# Patient Record
Sex: Female | Born: 1970 | Race: White | Hispanic: No | Smoking: Former smoker
Health system: Southern US, Community
[De-identification: ages and names within clinical notes are randomized; demographics above are authoritative.]

## PROBLEM LIST (undated history)

## (undated) DIAGNOSIS — I509 Heart failure, unspecified: Secondary | ICD-10-CM

## (undated) DIAGNOSIS — I2510 Atherosclerotic heart disease of native coronary artery without angina pectoris: Secondary | ICD-10-CM

## (undated) DIAGNOSIS — I10 Essential (primary) hypertension: Secondary | ICD-10-CM

## (undated) DIAGNOSIS — Z8719 Personal history of other diseases of the digestive system: Secondary | ICD-10-CM

## (undated) HISTORY — PX: CHOLECYSTECTOMY: SHX55

## (undated) HISTORY — PX: CARDIAC CATHETERIZATION: SHX172

## (undated) HISTORY — PX: TUBAL LIGATION: SHX77

---

## 2014-12-08 ENCOUNTER — Encounter (HOSPITAL_COMMUNITY): Payer: Self-pay | Admitting: *Deleted

## 2014-12-08 ENCOUNTER — Emergency Department (HOSPITAL_COMMUNITY)
Admission: EM | Admit: 2014-12-08 | Discharge: 2014-12-08 | Disposition: A | Discharge status: Home / Self Care | Payer: Self-pay | Source: Home / Self Care | Attending: Family Medicine | Admitting: Family Medicine

## 2014-12-08 DIAGNOSIS — J069 Acute upper respiratory infection, unspecified: Secondary | ICD-10-CM

## 2014-12-08 DIAGNOSIS — H6500 Acute serous otitis media, unspecified ear: Secondary | ICD-10-CM

## 2014-12-08 LAB — POCT RAPID STREP A: Streptococcus, Group A Screen (Direct): NEGATIVE

## 2014-12-08 MED ORDER — FEXOFENADINE-PSEUDOEPHED ER 60-120 MG PO TB12: 1.0000 | ORAL_TABLET | Freq: Two times a day (BID) | ORAL | Status: DC

## 2014-12-08 MED ORDER — PREDNISONE 10 MG PO TABS: ORAL_TABLET | ORAL | Status: DC

## 2014-12-08 MED ORDER — FLUTICASONE PROPIONATE 50 MCG/ACT NA SUSP: 2.0000 | Freq: Two times a day (BID) | NASAL | Status: DC

## 2014-12-08 NOTE — ED Provider Notes (Signed)
CSN: [id number]     Arrival date & time 12/08/14  1408 History   First MD Initiated Contact with Patient 12/08/14 1447     Chief Complaint  Patient presents with  . Sore Throat   (Consider location/radiation/quality/duration/timing/severity/associated sxs/prior Treatment) HPI        43 year old female presents for evaluation of left-sided earache and sore throat, and lymphadenopathy. She has sore throat with swallowing as well. Her symptoms began 3 days ago and have been constant, neither to worsening nor improving. She has not taken any medications for treatment.    History reviewed. No pertinent past medical history. Past Surgical History  Procedure Laterality Date  . Cholecystectomy     No family history on file. History  Substance Use Topics  . Smoking status: Current Every Day Smoker  . Smokeless tobacco: Not on file  . Alcohol Use: Yes     Comment: occasional   OB History    No data available     Review of Systems  Constitutional: Negative for fever and chills.  HENT: Positive for congestion, ear pain and sore throat. Negative for rhinorrhea.   Respiratory: Negative for cough and shortness of breath.   Hematological: Positive for adenopathy.  All other systems reviewed and are negative.   Allergies  Review of patient's allergies indicates no known allergies.  Home Medications   Prior to Admission medications   Medication Sig Start Date End Date Taking? Authorizing Provider  BIOTIN PO Take by mouth.   Yes Historical Provider, MD  Multiple Vitamin (MULTIVITAMIN) capsule Take 1 capsule by mouth daily.   Yes Historical Provider, MD  fexofenadine-pseudoephedrine (ALLEGRA-D) 60-120 MG per tablet Take 1 tablet by mouth every 12 (twelve) hours. 12/08/14   Adrian BlackwaterZachary H Kamill Fulbright, PA-C  fluticasone (FLONASE) 50 MCG/ACT nasal spray Place 2 sprays into both nostrils 2 (two) times daily. Decrease to 2 sprays/nostril daily after 5 days 12/08/14   Graylon GoodZachary H Merridy Pascoe, PA-C  predniSONE  (DELTASONE) 10 MG tablet 4 tabs PO QD for 4 days; 3 tabs PO QD for 3 days; 2 tabs PO QD for 2 days; 1 tab PO QD for 1 day 12/08/14   Graylon GoodZachary H Lya Holben, PA-C   BP 124/78 mmHg  Pulse 84  Temp(Src) 98.3 F (36.8 C) (Oral)  Resp 18  SpO2 98%  LMP 11/14/2014 (Exact Date) Physical Exam  Constitutional: She is oriented to person, place, and time. Vital signs are normal. She appears well-developed and well-nourished. No distress.  HENT:  Head: Normocephalic and atraumatic.  Right Ear: Tympanic membrane, external ear and ear canal normal.  Left Ear: Ear canal normal. Left ear middle ear effusion: serous otitis media.  Neck: Normal range of motion. Neck supple.  Cardiovascular: Normal rate, regular rhythm and normal heart sounds.   Pulmonary/Chest: Effort normal and breath sounds normal. No respiratory distress.  Lymphadenopathy:       Head (right side): Tonsillar adenopathy present. No submandibular, no preauricular and no posterior auricular adenopathy present.       Head (left side): Tonsillar adenopathy present. No submandibular, no preauricular and no posterior auricular adenopathy present.    She has no cervical adenopathy.  Neurological: She is alert and oriented to person, place, and time. She has normal strength. Coordination normal.  Skin: Skin is warm and dry. No rash noted. She is not diaphoretic.  Psychiatric: She has a normal mood and affect. Judgment normal.  Nursing note and vitals reviewed.   ED Course  Procedures (including critical care time) Labs  Review Labs Reviewed  POCT RAPID STREP A ([facility] CARE ONLY)    Imaging Review No results found.   MDM   1. URI (upper respiratory infection)   2. Acute serous otitis media, recurrence not specified, unspecified laterality    Vitals are normal, she is afebrile and nontoxic, with mild symptoms. Physical exam shows serous otitis media. Treat symptomatically. Follow-up if no improvement in a week   Discharge Medication List  as of 12/08/2014  2:47 PM    START taking these medications   Details  fexofenadine-pseudoephedrine (ALLEGRA-D) 60-120 MG per tablet Take 1 tablet by mouth every 12 (twelve) hours., Starting 12/08/2014, Until Discontinued, Normal    fluticasone (FLONASE) 50 MCG/ACT nasal spray Place 2 sprays into both nostrils 2 (two) times daily. Decrease to 2 sprays/nostril daily after 5 days, Starting 12/08/2014, Until Discontinued, Normal    predniSONE (DELTASONE) 10 MG tablet 4 tabs PO QD for 4 days; 3 tabs PO QD for 3 days; 2 tabs PO QD for 2 days; 1 tab PO QD for 1 day, Normal           Graylon Good, PA-C 12/08/14 1513

## 2014-12-08 NOTE — ED Notes (Signed)
Started with left earache and sore throat 2 days ago.  Now also c/o lymphadenopathy.  Denies fevers.  Has not taken any measures to help alleviate sxs.

## 2014-12-08 NOTE — Discharge Instructions (Signed)
Serous Otitis Media Serous otitis media is fluid in the middle ear space. This space contains the bones for hearing and air. Air in the middle ear space helps to transmit sound.  The air gets there through the eustachian tube. This tube goes from the back of the nose (nasopharynx) to the middle ear space. It keeps the pressure in the middle ear the same as the outside world. It also helps to drain fluid from the middle ear space. CAUSES  Serous otitis media occurs when the eustachian tube gets blocked. Blockage can come from:  Ear infections.  Colds and other upper respiratory infections.  Allergies.  Irritants such as cigarette smoke.  Sudden changes in air pressure (such as descending in an airplane).  Enlarged adenoids.  A mass in the nasopharynx. During colds and upper respiratory infections, the middle ear space can become temporarily filled with fluid. This can happen after an ear infection also. Once the infection clears, the fluid will generally drain out of the ear through the eustachian tube. If it does not, then serous otitis media occurs. SIGNS AND SYMPTOMS   Hearing loss.  A feeling of fullness in the ear, without pain.  Young children may not show any symptoms but may show slight behavioral changes, such as agitation, ear pulling, or crying. DIAGNOSIS  Serous otitis media is diagnosed by an ear exam. Tests may be done to check on the movement of the eardrum. Hearing exams may also be done. TREATMENT  The fluid most often goes away without treatment. If allergy is the cause, allergy treatment may be helpful. Fluid that persists for several months may require minor surgery. A small tube is placed in the eardrum to:  Drain the fluid.  Restore the air in the middle ear space. In certain situations, antibiotic medicines are used to avoid surgery. Surgery may be done to remove enlarged adenoids (if this is the cause). HOME CARE INSTRUCTIONS   Keep children away from  tobacco smoke.  Keep all follow-up visits as directed by your health care provider. SEEK MEDICAL CARE IF:   Your hearing is not better in 3 months.  Your hearing is worse.  You have ear pain.  You have drainage from the ear.  You have dizziness.  You have serous otitis media only in one ear or have any bleeding from your nose (epistaxis).  You notice a lump on your neck. MAKE SURE YOU:  Understand these instructions.   Will watch your condition.   Will get help right away if you are not doing well or get worse.  Document Released: 03/04/2004 Document Revised: 04/29/2014 Document Reviewed: 07/10/2013 [facility] Patient Information 2015 [place], [place]. This information is not intended to replace advice given to you by your health care provider. Make sure you discuss any questions you have with your health care provider.  Upper Respiratory Infection, Adult An upper respiratory infection (URI) is also sometimes known as the common cold. The upper respiratory tract includes the nose, sinuses, throat, trachea, and bronchi. Bronchi are the airways leading to the lungs. Most people improve within 1 week, but symptoms can last up to 2 weeks. A residual cough may last even longer.  CAUSES Many different viruses can infect the tissues lining the upper respiratory tract. The tissues become irritated and inflamed and often become very moist. Mucus production is also common. A cold is contagious. You can easily spread the virus to others by oral contact. This includes kissing, sharing a glass, coughing, or sneezing.  Touching your mouth or nose and then touching a surface, which is then touched by another person, can also spread the virus. SYMPTOMS  Symptoms typically develop 1 to 3 days after you come in contact with a cold virus. Symptoms vary from person to person. They may include:  Runny nose.  Sneezing.  Nasal congestion.  Sinus irritation.  Sore throat.  Loss of voice  (laryngitis).  Cough.  Fatigue.  Muscle aches.  Loss of appetite.  Headache.  Low-grade fever. DIAGNOSIS  You might diagnose your own cold based on familiar symptoms, since most people get a cold 2 to 3 times a year. Your caregiver can confirm this based on your exam. Most importantly, your caregiver can check that your symptoms are not due to another disease such as strep throat, sinusitis, pneumonia, asthma, or epiglottitis. Blood tests, throat tests, and X-rays are not necessary to diagnose a common cold, but they may sometimes be helpful in excluding other more serious diseases. Your caregiver will decide if any further tests are required. RISKS AND COMPLICATIONS  You may be at risk for a more severe case of the common cold if you smoke cigarettes, have chronic heart disease (such as heart failure) or lung disease (such as asthma), or if you have a weakened immune system. The very young and very old are also at risk for more serious infections. Bacterial sinusitis, middle ear infections, and bacterial pneumonia can complicate the common cold. The common cold can worsen asthma and chronic obstructive pulmonary disease (COPD). Sometimes, these complications can require emergency medical care and may be life-threatening. PREVENTION  The best way to protect against getting a cold is to practice good hygiene. Avoid oral or hand contact with people with cold symptoms. Wash your hands often if contact occurs. There is no clear evidence that vitamin C, vitamin E, echinacea, or exercise reduces the chance of developing a cold. However, it is always recommended to get plenty of rest and practice good nutrition. TREATMENT  Treatment is directed at relieving symptoms. There is no cure. Antibiotics are not effective, because the infection is caused by a virus, not by bacteria. Treatment may include:  Increased fluid intake. Sports drinks offer valuable electrolytes, sugars, and fluids.  Breathing  heated mist or steam (vaporizer or shower).  Eating chicken soup or other clear broths, and maintaining good nutrition.  Getting plenty of rest.  Using gargles or lozenges for comfort.  Controlling fevers with ibuprofen or acetaminophen as directed by your caregiver.  Increasing usage of your inhaler if you have asthma. Zinc gel and zinc lozenges, taken in the first 24 hours of the common cold, can shorten the duration and lessen the severity of symptoms. Pain medicines may help with fever, muscle aches, and throat pain. A variety of non-prescription medicines are available to treat congestion and runny nose. Your caregiver can make recommendations and may suggest nasal or lung inhalers for other symptoms.  HOME CARE INSTRUCTIONS   Only take over-the-counter or prescription medicines for pain, discomfort, or fever as directed by your caregiver.  Use a warm mist humidifier or inhale steam from a shower to increase air moisture. This may keep secretions moist and make it easier to breathe.  Drink enough water and fluids to keep your urine clear or pale yellow.  Rest as needed.  Return to work when your temperature has returned to normal or as your caregiver advises. You may need to stay home longer to avoid infecting others. You can also use  a face mask and careful hand washing to prevent spread of the virus. SEEK MEDICAL CARE IF:   After the first few days, you feel you are getting worse rather than better.  You need your caregiver's advice about medicines to control symptoms.  You develop chills, worsening shortness of breath, or brown or red sputum. These may be signs of pneumonia.  You develop yellow or brown nasal discharge or pain in the face, especially when you bend forward. These may be signs of sinusitis.  You develop a fever, swollen neck glands, pain with swallowing, or white areas in the back of your throat. These may be signs of strep throat. SEEK IMMEDIATE MEDICAL CARE  IF:   You have a fever.  You develop severe or persistent headache, ear pain, sinus pain, or chest pain.  You develop wheezing, a prolonged cough, cough up blood, or have a change in your usual mucus (if you have chronic lung disease).  You develop sore muscles or a stiff neck. Document Released: 06/08/2001 Document Revised: 03/06/2012 Document Reviewed: 03/20/2014 [facility] Patient Information 2015 [place], [place]. This information is not intended to replace advice given to you by your health care provider. Make sure you discuss any questions you have with your health care provider.

## 2014-12-10 LAB — CULTURE, GROUP A STREP

## 2021-03-10 ENCOUNTER — Other Ambulatory Visit: Payer: Self-pay

## 2021-03-10 ENCOUNTER — Emergency Department (HOSPITAL_BASED_OUTPATIENT_CLINIC_OR_DEPARTMENT_OTHER)
Admission: EM | Admit: 2021-03-10 | Discharge: 2021-03-11 | Disposition: A | Discharge status: Home / Self Care | Payer: No Typology Code available for payment source | Source: Home / Self Care | Attending: Emergency Medicine | Admitting: Emergency Medicine

## 2021-03-10 DIAGNOSIS — I509 Heart failure, unspecified: Secondary | ICD-10-CM

## 2021-03-10 DIAGNOSIS — F17200 Nicotine dependence, unspecified, uncomplicated: Secondary | ICD-10-CM | POA: Insufficient documentation

## 2021-03-10 DIAGNOSIS — R609 Edema, unspecified: Secondary | ICD-10-CM | POA: Insufficient documentation

## 2021-03-10 DIAGNOSIS — K5900 Constipation, unspecified: Secondary | ICD-10-CM | POA: Insufficient documentation

## 2021-03-10 DIAGNOSIS — R000 Tachycardia, unspecified: Secondary | ICD-10-CM | POA: Insufficient documentation

## 2021-03-10 LAB — COMPREHENSIVE METABOLIC PANEL
ALT: 74 U/L — ABNORMAL HIGH (ref 0–44)
AST: 48 U/L — ABNORMAL HIGH (ref 15–41)
Albumin: 3.8 g/dL (ref 3.5–5.0)
Alkaline Phosphatase: 67 U/L (ref 38–126)
Anion gap: 11 (ref 5–15)
BUN: 14 mg/dL (ref 6–20)
CO2: 19 mmol/L — ABNORMAL LOW (ref 22–32)
Calcium: 8.7 mg/dL — ABNORMAL LOW (ref 8.9–10.3)
Chloride: 98 mmol/L (ref 98–111)
Creatinine, Ser: 0.83 mg/dL (ref 0.44–1.00)
GFR, Estimated: >60 mL/min (ref >60)
Glucose, Bld: 94 mg/dL (ref 70–99)
Potassium: 4.1 mmol/L (ref 3.5–5.1)
Sodium: 128 mmol/L — ABNORMAL LOW (ref 135–145)
Total Bilirubin: 0.5 mg/dL (ref 0.3–1.2)
Total Protein: 5.6 g/dL — ABNORMAL LOW (ref 6.5–8.1)

## 2021-03-10 LAB — CBC
HCT: 41.5 % (ref 36.0–46.0)
Hemoglobin: 14.5 g/dL (ref 12.0–15.0)
MCH: 34.5 pg — ABNORMAL HIGH (ref 26.0–34.0)
MCHC: 34.9 g/dL (ref 30.0–36.0)
MCV: 98.8 fL (ref 80.0–100.0)
Platelets: 274 10*3/uL (ref 150–400)
RBC: 4.2 MIL/uL (ref 3.87–5.11)
RDW: 13.2 % (ref 11.5–15.5)
WBC: 8.9 10*3/uL (ref 4.0–10.5)
nRBC: 0 % (ref 0.0–0.2)

## 2021-03-10 LAB — BRAIN NATRIURETIC PEPTIDE: B Natriuretic Peptide: 2348.9 pg/mL — ABNORMAL HIGH (ref 0.0–100.0)

## 2021-03-10 NOTE — ED Provider Notes (Signed)
[facility] EMERGENCY DEPT Provider Note   CSN: [id number] Arrival date & time: 03/10/21  2226     History Chief Complaint  Patient presents with  . Constipation  . Leg Swelling    Pamela Bryan is a 50 y.o. female.  Patient c/o bilateral leg swelling up to abdomen within the past two weeks. Symptoms acute onset, moderate, constant, persistent. Denies hx same. Denies hx liver or kidney disease. States was treated two weeks ago with antibiotic and prednisone for 'bronchitis' - cough improved and has been off antibiotic and prednisone but swelling persists. No fever/chills. ?10-15 lb wt increase. ?orthopnea. No pnd. No current or recent chest pain or discomfort. Occasional etoh use, no hx cirrhosis. Denies hx cad or chf. Urinating normal amount. Feels constipated at times, states is having bm q day, but smaller amount that normal. No nv.   The history is provided by a friend.  Constipation Associated symptoms: no abdominal pain, no back pain, no diarrhea, no dysuria, no fever and no vomiting        No past medical history on file.  There are no problems to display for this patient.   Past Surgical History:  Procedure Laterality Date  . CHOLECYSTECTOMY       OB History   No obstetric history on file.     No family history on file.  Social History   Tobacco Use  . Smoking status: Current Every Day Smoker  Substance Use Topics  . Alcohol use: Yes    Comment: occasional  . Drug use: No    Home Medications Prior to Admission medications   Medication Sig Start Date End Date Taking? Authorizing Provider  BIOTIN PO Take by mouth.    [provider]  fexofenadine-pseudoephedrine (ALLEGRA-D) 60-120 MG per tablet Take 1 tablet by mouth every 12 (twelve) hours. 12/08/14   Excell Seltzer, Adrian Blackwater, PA-C  fluticasone (FLONASE) 50 MCG/ACT nasal spray Place 2 sprays into both nostrils 2 (two) times daily. Decrease to 2 sprays/nostril daily after 5 days 12/08/14    Graylon Good, PA-C  Multiple Vitamin (MULTIVITAMIN) capsule Take 1 capsule by mouth daily.    [provider]  predniSONE (DELTASONE) 10 MG tablet 4 tabs PO QD for 4 days; 3 tabs PO QD for 3 days; 2 tabs PO QD for 2 days; 1 tab PO QD for 1 day 12/08/14   Graylon Good, PA-C    Allergies    Patient has no known allergies.  Review of Systems   Review of Systems  Constitutional: Negative for chills and fever.  HENT: Negative for sore throat.   Eyes: Negative for redness.  Respiratory:       Recent cough. ?mild recent orthopnea.  Cardiovascular: Positive for leg swelling. Negative for chest pain.  Gastrointestinal: Positive for constipation. Negative for abdominal pain, diarrhea and vomiting.  Genitourinary: Negative for decreased urine volume, dysuria and flank pain.  Musculoskeletal: Negative for back pain and neck pain.  Skin: Negative for rash.  Neurological: Negative for headaches.  Hematological: Does not bruise/bleed easily.  Psychiatric/Behavioral: Negative for confusion.    Physical Exam Updated Vital Signs BP 116/88 (BP Location: Right Arm)   Pulse (!) 110   Temp 97.8 F (36.6 C) (Oral)   Resp 20   Ht 1.702 m (5\' 7" )   Wt 74.8 kg   LMP  (LMP Unknown)   SpO2 98%   BMI 25.84 kg/m   Physical Exam Vitals and nursing note reviewed.  Constitutional:  Appearance: Normal appearance. She is well-developed.  HENT:     Head: Atraumatic.     Nose: Nose normal.     Mouth/Throat:     Mouth: Mucous membranes are moist.  Eyes:     General: No scleral icterus.    Conjunctiva/sclera: Conjunctivae normal.  Neck:     Trachea: No tracheal deviation.  Cardiovascular:     Rate and Rhythm: Normal rate and regular rhythm.     Pulses: Normal pulses.     Heart sounds: Normal heart sounds. No murmur heard. No friction rub. No gallop.   Pulmonary:     Effort: Pulmonary effort is normal. No respiratory distress.     Breath sounds: Normal breath sounds.   Abdominal:     General: Bowel sounds are normal. There is no distension.     Palpations: Abdomen is soft.     Tenderness: There is no abdominal tenderness. There is no guarding.  Genitourinary:    Comments: No cva tenderness.  Musculoskeletal:     Cervical back: Normal range of motion and neck supple. No rigidity. No muscular tenderness.     Right lower leg: Edema present.     Left lower leg: Edema present.     Comments: Symmetric bilateral leg edema, pitting, to mid abd.   Skin:    General: Skin is warm and dry.     Findings: No rash.  Neurological:     Mental Status: She is alert.     Comments: Alert, speech normal.   Psychiatric:        Mood and Affect: Mood normal.     ED Results / Procedures / Treatments   Labs (all labs ordered are listed, but only abnormal results are displayed) Labs Reviewed  CBC  COMPREHENSIVE METABOLIC PANEL  BRAIN NATRIURETIC PEPTIDE    EKG None  Radiology No results found.  Procedures Procedures   Medications Ordered in ED Medications - No data to display  ED Course  I have reviewed the triage vital signs and the nursing notes.  Pertinent labs & imaging results that were available during my care of the patient were reviewed by me and considered in my medical decision making (see chart for details).    MDM Rules/Calculators/A&P                          Labs sent.   Reviewed nursing notes and prior charts for additional history.   2300 labs pending, signed out to Dr Judd Lien to check labs, and disposition appropriately.    Final Clinical Impression(s) / ED Diagnoses Final diagnoses:  None    Rx / DC Orders ED Discharge Orders    None       Cathren Laine, MD 03/10/21 2300

## 2021-03-10 NOTE — ED Triage Notes (Addendum)
Pt via POV, patient reports approx 2 weeks ago was seen at urgent care for bronchitis and was given steroids for treatment. Pt reports after being on treatment has noted increase constipated and has noted swelling in bilateral legs. Pt ambulatory to room 11. NAD.  +ETOH.

## 2021-03-10 NOTE — ED Notes (Signed)
Called lab to add on troponin to blood work already in lab. Provider updated.

## 2021-03-11 ENCOUNTER — Other Ambulatory Visit: Payer: Self-pay

## 2021-03-11 ENCOUNTER — Encounter (HOSPITAL_COMMUNITY): Payer: Self-pay | Admitting: Emergency Medicine

## 2021-03-11 ENCOUNTER — Inpatient Hospital Stay (HOSPITAL_COMMUNITY)
Admission: EM | Admit: 2021-03-11 | Discharge: 2021-03-16 | DRG: 287 | Disposition: A | Discharge status: Home / Self Care | Payer: No Typology Code available for payment source | Attending: Internal Medicine | Admitting: Internal Medicine

## 2021-03-11 ENCOUNTER — Emergency Department (HOSPITAL_BASED_OUTPATIENT_CLINIC_OR_DEPARTMENT_OTHER): Payer: No Typology Code available for payment source

## 2021-03-11 ENCOUNTER — Emergency Department (HOSPITAL_COMMUNITY): Payer: No Typology Code available for payment source

## 2021-03-11 DIAGNOSIS — F17210 Nicotine dependence, cigarettes, uncomplicated: Secondary | ICD-10-CM | POA: Diagnosis present

## 2021-03-11 DIAGNOSIS — R609 Edema, unspecified: Secondary | ICD-10-CM

## 2021-03-11 DIAGNOSIS — I5082 Biventricular heart failure: Secondary | ICD-10-CM | POA: Diagnosis present

## 2021-03-11 DIAGNOSIS — I5021 Acute systolic (congestive) heart failure: Secondary | ICD-10-CM

## 2021-03-11 DIAGNOSIS — R7989 Other specified abnormal findings of blood chemistry: Secondary | ICD-10-CM | POA: Diagnosis present

## 2021-03-11 DIAGNOSIS — Z9049 Acquired absence of other specified parts of digestive tract: Secondary | ICD-10-CM

## 2021-03-11 DIAGNOSIS — Z8249 Family history of ischemic heart disease and other diseases of the circulatory system: Secondary | ICD-10-CM

## 2021-03-11 DIAGNOSIS — M7989 Other specified soft tissue disorders: Secondary | ICD-10-CM | POA: Diagnosis present

## 2021-03-11 DIAGNOSIS — J811 Chronic pulmonary edema: Secondary | ICD-10-CM | POA: Diagnosis present

## 2021-03-11 DIAGNOSIS — I5023 Acute on chronic systolic (congestive) heart failure: Principal | ICD-10-CM | POA: Diagnosis present

## 2021-03-11 DIAGNOSIS — F1010 Alcohol abuse, uncomplicated: Secondary | ICD-10-CM | POA: Diagnosis present

## 2021-03-11 DIAGNOSIS — Z79899 Other long term (current) drug therapy: Secondary | ICD-10-CM

## 2021-03-11 DIAGNOSIS — I509 Heart failure, unspecified: Secondary | ICD-10-CM

## 2021-03-11 DIAGNOSIS — R0683 Snoring: Secondary | ICD-10-CM | POA: Diagnosis present

## 2021-03-11 DIAGNOSIS — K7460 Unspecified cirrhosis of liver: Secondary | ICD-10-CM | POA: Diagnosis present

## 2021-03-11 DIAGNOSIS — J810 Acute pulmonary edema: Secondary | ICD-10-CM | POA: Diagnosis not present

## 2021-03-11 DIAGNOSIS — Z20822 Contact with and (suspected) exposure to covid-19: Secondary | ICD-10-CM | POA: Diagnosis present

## 2021-03-11 DIAGNOSIS — I426 Alcoholic cardiomyopathy: Secondary | ICD-10-CM | POA: Diagnosis present

## 2021-03-11 HISTORY — DX: Heart failure, unspecified: I50.9

## 2021-03-11 LAB — URINALYSIS, ROUTINE W REFLEX MICROSCOPIC
Bilirubin Urine: NEGATIVE
Glucose, UA: NEGATIVE mg/dL
Hgb urine dipstick: NEGATIVE
Ketones, ur: NEGATIVE mg/dL
Leukocytes,Ua: NEGATIVE
Nitrite: NEGATIVE
Protein, ur: NEGATIVE mg/dL
Specific Gravity, Urine: 1.004 — ABNORMAL LOW (ref 1.005–1.030)
pH: 6 (ref 5.0–8.0)

## 2021-03-11 LAB — CBC
HCT: 44.4 % (ref 36.0–46.0)
Hemoglobin: 15.4 g/dL — ABNORMAL HIGH (ref 12.0–15.0)
MCH: 34.7 pg — ABNORMAL HIGH (ref 26.0–34.0)
MCHC: 34.7 g/dL (ref 30.0–36.0)
MCV: 100 fL (ref 80.0–100.0)
Platelets: 308 10*3/uL (ref 150–400)
RBC: 4.44 MIL/uL (ref 3.87–5.11)
RDW: 13.2 % (ref 11.5–15.5)
WBC: 7.2 10*3/uL (ref 4.0–10.5)
nRBC: 0 % (ref 0.0–0.2)

## 2021-03-11 LAB — BASIC METABOLIC PANEL
Anion gap: 10 (ref 5–15)
BUN: 13 mg/dL (ref 6–20)
CO2: 25 mmol/L (ref 22–32)
Calcium: 8.8 mg/dL — ABNORMAL LOW (ref 8.9–10.3)
Chloride: 101 mmol/L (ref 98–111)
Creatinine, Ser: 0.88 mg/dL (ref 0.44–1.00)
GFR, Estimated: >60 mL/min (ref >60)
Glucose, Bld: 69 mg/dL — ABNORMAL LOW (ref 70–99)
Potassium: 4 mmol/L (ref 3.5–5.1)
Sodium: 136 mmol/L (ref 135–145)

## 2021-03-11 LAB — I-STAT BETA HCG BLOOD, ED (MC, WL, AP ONLY): I-stat hCG, quantitative: <5 m[IU]/mL (ref <5)

## 2021-03-11 LAB — TROPONIN I (HIGH SENSITIVITY): Troponin I (High Sensitivity): 98 ng/L — ABNORMAL HIGH (ref <18)

## 2021-03-11 MED ORDER — THIAMINE HCL 100 MG PO TABS
100.0000 mg | ORAL_TABLET | Freq: Every day | ORAL | Status: DC
  Administered 2021-03-11 – 2021-03-16 (×6): 100 mg via ORAL
  Filled 2021-03-11 (×6): qty 1

## 2021-03-11 MED ORDER — LORAZEPAM 2 MG/ML IJ SOLN: 0.0000 mg | Freq: Two times a day (BID) | INTRAMUSCULAR | Status: AC

## 2021-03-11 MED ORDER — ACETAMINOPHEN 325 MG PO TABS
650.0000 mg | ORAL_TABLET | Freq: Four times a day (QID) | ORAL | Status: DC | PRN
  Administered 2021-03-14: 650 mg via ORAL

## 2021-03-11 MED ORDER — THIAMINE HCL 100 MG/ML IJ SOLN: 100.0000 mg | Freq: Every day | INTRAMUSCULAR | Status: DC

## 2021-03-11 MED ORDER — LORAZEPAM 1 MG PO TABS
0.0000 mg | ORAL_TABLET | Freq: Two times a day (BID) | ORAL | Status: AC
  Administered 2021-03-15: 1 mg via ORAL
  Filled 2021-03-11: qty 1

## 2021-03-11 MED ORDER — LORAZEPAM 2 MG/ML IJ SOLN
1.0000 mg | Freq: Once | INTRAMUSCULAR | Status: AC
  Administered 2021-03-11: 1 mg via INTRAVENOUS
  Filled 2021-03-11: qty 1

## 2021-03-11 MED ORDER — POTASSIUM CHLORIDE 20 MEQ PO PACK
20.0000 meq | PACK | Freq: Every day | ORAL | Status: DC
  Administered 2021-03-12 – 2021-03-16 (×5): 20 meq via ORAL
  Filled 2021-03-11 (×5): qty 1

## 2021-03-11 MED ORDER — ACETAMINOPHEN 650 MG RE SUPP: 650.0000 mg | Freq: Four times a day (QID) | RECTAL | Status: DC | PRN

## 2021-03-11 MED ORDER — LORAZEPAM 1 MG PO TABS
0.0000 mg | ORAL_TABLET | Freq: Four times a day (QID) | ORAL | Status: AC
  Administered 2021-03-12 – 2021-03-13 (×5): 1 mg via ORAL
  Filled 2021-03-11 (×4): qty 1
  Filled 2021-03-11: qty 4

## 2021-03-11 MED ORDER — FUROSEMIDE 10 MG/ML IJ SOLN
40.0000 mg | Freq: Two times a day (BID) | INTRAMUSCULAR | Status: DC
  Administered 2021-03-12 – 2021-03-15 (×6): 40 mg via INTRAVENOUS
  Filled 2021-03-11 (×6): qty 4

## 2021-03-11 MED ORDER — ENOXAPARIN SODIUM 40 MG/0.4ML ~~LOC~~ SOLN
40.0000 mg | Freq: Every day | SUBCUTANEOUS | Status: DC
  Administered 2021-03-12 (×2): 40 mg via SUBCUTANEOUS
  Filled 2021-03-11 (×2): qty 0.4

## 2021-03-11 MED ORDER — NICOTINE 21 MG/24HR TD PT24
21.0000 mg | MEDICATED_PATCH | Freq: Every day | TRANSDERMAL | Status: DC
  Administered 2021-03-11 – 2021-03-16 (×6): 21 mg via TRANSDERMAL
  Filled 2021-03-11 (×6): qty 1

## 2021-03-11 MED ORDER — LORAZEPAM 2 MG/ML IJ SOLN: 0.0000 mg | Freq: Four times a day (QID) | INTRAMUSCULAR | Status: AC

## 2021-03-11 MED ORDER — FUROSEMIDE 40 MG PO TABS: 40.0000 mg | ORAL_TABLET | Freq: Every day | ORAL | 0 refills | Status: DC

## 2021-03-11 MED ORDER — FUROSEMIDE 40 MG PO TABS
40.0000 mg | ORAL_TABLET | Freq: Once | ORAL | Status: AC
  Administered 2021-03-11: 40 mg via ORAL
  Filled 2021-03-11: qty 1

## 2021-03-11 MED ORDER — FUROSEMIDE 10 MG/ML IJ SOLN
20.0000 mg | Freq: Once | INTRAMUSCULAR | Status: AC
  Administered 2021-03-11: 20 mg via INTRAVENOUS
  Filled 2021-03-11: qty 2

## 2021-03-11 NOTE — H&P (Signed)
History and Physical    Pamela Bryan LXB:[id number] DOB: Jan 29, 1971 DOA: 03/11/2021  Pamela Bryan  Patient coming from: Home  I have personally briefly reviewed patient's old medical records in [facility]  Chief Complaint: Shortness of breath, lower extremity swelling, weight gain  HPI: Pamela Bryan is a 50 y.o. female with medical history significant for alcohol and tobacco use who presents to the ED for evaluation of progressive shortness of breath, lower extremity swelling.  Patient initially presented to [facility] ED last night 03/10/2021 with report of bilateral leg swelling with 10-15 pound weight gain.  She was found to have elevated BNP 2348.9 and high-sensitivity troponin I 98.  Portable chest x-ray showed small bilateral pleural effusions.  The emergency physician discussed with on-call cardiologist who recommended admission for diuresis and further evaluation.  Patient however refused admission and left the ED this morning.  She was given a dose of oral Lasix 40 mg once before she left as well as a prescription for the same.  Patient returns to the ED with continued orthopnea, dyspnea on exertion, PND, lower extremity swelling.  She reports occasional lower sternal chest discomfort described as a heavy sensation.  She reports a dry cough which has been ongoing over the last 2 months.  She says she was recently diagnosed with bronchitis and treated with a course of prednisone.  She is not aware of any personal chronic medical conditions.  She is a current smoker of about 1 pack/day since age 44.  She reports 2-3 alcoholic drinks Bryan night.  She denies any illicit drug use.  She reports a history of valvular heart disease in her sister and her sister's son.  ED Course:  Initial vitals showed BP 99/75, pulse 125, RR 18, temp 97.6 F, SPO2 96% on room air.  Labs show WBC 7.2, hemoglobin 15.4, platelets 308,000, sodium 136, potassium 4.0, bicarb 25, BUN 13, creatinine  0.88, serum glucose 69, i-STAT beta-hCG <5.0.  SARS-CoV-2 PCR panel ordered and pending.  2 view chest x-ray shows developing peribronchial thickening suspicious for pulmonary edema, cardiomegaly and small bilateral pleural effusions which are unchanged.  Patient was given IV Lasix 20 mg once and placed on CIWA protocol with Ativan as needed.  She has been given 1 mg IV Ativan so far.  The hospitalist service was consulted to admit for further evaluation and management.  Review of Systems: All systems reviewed and are negative except as documented in history of present illness above.   Past Medical History:  Diagnosis Date  . CHF (congestive heart failure) (HCC)     Past Surgical History:  Procedure Laterality Date  . CHOLECYSTECTOMY      Social History:  reports that she has been smoking. She does not have any smokeless tobacco history on file. She reports current alcohol use. She reports that she does not use drugs.  No Known Allergies  Family History  Problem Relation Age of Onset  . Valvular heart disease Sister      Prior to Admission medications   Medication Sig Start Date End Date Taking? Authorizing Provider  furosemide (LASIX) 40 MG tablet Take 1 tablet (40 mg total) by mouth daily. 03/11/21  Yes Delo, Riley Lam, MD  levonorgestrel (LILETTA, 52 MG,) 19.5 MCG/DAY IUD IUD 1 Intra Uterine Device by Intrauterine route. 04/23/16  Yes [provider]  Multiple Vitamin (MULTIVITAMIN) capsule Take 1 capsule by mouth daily.   Yes [provider]  doxycycline (VIBRAMYCIN) 100 MG capsule Take  100 mg by mouth See admin instructions. Bid x 10 days Patient not taking: No sig reported 02/22/21   [provider]  predniSONE (DELTASONE) 20 MG tablet Take 40 mg by mouth See admin instructions. 40mg  daily x 5 days Patient not taking: No sig reported    [provider]    Physical Exam: Vitals:   03/11/21 2003 03/11/21 2300  BP: 99/75 108/80  Pulse:  (!) 125 (!) 108  Resp: 18 (!) 25  Temp: 97.6 F (36.4 C)   TempSrc: Oral   SpO2: 96% 96%   Constitutional: Resting in bed, NAD, calm, comfortable Eyes: PERRL, lids and conjunctivae normal ENMT: Mucous membranes are moist. Posterior pharynx clear of any exudate or lesions.Normal dentition.  Neck: normal, supple, no masses. Respiratory: Bibasilar inspiratory crackles. Normal respiratory effort. No accessory muscle use.  Cardiovascular: Regular rate and rhythm, no murmurs / rubs / gallops.  +1 bilateral lower extremity edema. 2+ pedal pulses. Abdomen: no tenderness, no masses palpated. No hepatosplenomegaly. Bowel sounds positive.  Musculoskeletal: no clubbing / cyanosis. No joint deformity upper and lower extremities. Good ROM, no contractures. Normal muscle tone.  Skin: no rashes, lesions, ulcers. No induration Neurologic: CN 2-12 grossly intact. Sensation intact. Strength 5/5 in all 4.  Psychiatric: Normal judgment and insight. Alert and oriented x 3. Normal mood.   Labs on Admission: I have personally reviewed following labs and imaging studies  CBC: Recent Labs  Lab 03/10/21 2253 03/11/21 2008  WBC 8.9 7.2  HGB 14.5 15.4*  HCT 41.5 44.4  MCV 98.8 100.0  PLT 274 308   Basic Metabolic Panel: Recent Labs  Lab 03/10/21 2253 03/11/21 2008  NA 128* 136  K 4.1 4.0  CL 98 101  CO2 19* 25  GLUCOSE 94 69*  BUN 14 13  CREATININE 0.83 0.88  CALCIUM 8.7* 8.8*   GFR: Estimated Creatinine Clearance: 81.7 mL/min (by C-G formula based on SCr of 0.88 mg/dL). Liver Function Tests: Recent Labs  Lab 03/10/21 2253  AST 48*  ALT 74*  ALKPHOS 67  BILITOT 0.5  PROT 5.6*  ALBUMIN 3.8   No results for input(s): LIPASE, AMYLASE in the last 168 hours. No results for input(s): AMMONIA in the last 168 hours. Coagulation Profile: No results for input(s): INR, PROTIME in the last 168 hours. Cardiac Enzymes: No results for input(s): CKTOTAL, CKMB, CKMBINDEX, TROPONINI in the last 168  hours. BNP (last 3 results) No results for input(s): PROBNP in the last 8760 hours. HbA1C: No results for input(s): HGBA1C in the last 72 hours. CBG: No results for input(s): GLUCAP in the last 168 hours. Lipid Profile: No results for input(s): CHOL, HDL, LDLCALC, TRIG, CHOLHDL, LDLDIRECT in the last 72 hours. Thyroid Function Tests: No results for input(s): TSH, T4TOTAL, FREET4, T3FREE, THYROIDAB in the last 72 hours. Anemia Panel: No results for input(s): VITAMINB12, FOLATE, FERRITIN, TIBC, IRON, RETICCTPCT in the last 72 hours. Urine analysis: No results found for: COLORURINE, APPEARANCEUR, LABSPEC, PHURINE, GLUCOSEU, HGBUR, BILIRUBINUR, KETONESUR, PROTEINUR, UROBILINOGEN, NITRITE, LEUKOCYTESUR  Radiological Exams on Admission: DG Chest 2 View  Result Date: 03/11/2021 CLINICAL DATA:  Shortness of breath for 2-3 days.  History of CHF. EXAM: CHEST - 2 VIEW COMPARISON:  Radiograph earlier today FINDINGS: Cardiomegaly. Small bilateral pleural effusions are similar. Ovoid density in the left mid lung likely represents fluid in the fissures on the lateral view. Developing peribronchial thickening. No pneumothorax. No acute osseous abnormalities are seen. IMPRESSION: 1. Developing peribronchial thickening, suspicious for pulmonary edema. 2.  Cardiomegaly and small bilateral pleural effusions, unchanged. Ovoid density in the left mid lung likely represents fluid in the fissure. Electronically Signed   By: Narda Rutherford M.D.   On: 03/11/2021 20:29   DG Chest Port 1 View  Result Date: 03/11/2021 CLINICAL DATA:  Shortness of breath with leg swelling EXAM: PORTABLE CHEST 1 VIEW COMPARISON:  None. FINDINGS: Small bilateral pleural effusions with basilar airspace disease. Mild cardiomegaly. Ovoid airspace disease in the left mid lung. No pneumothorax. IMPRESSION: Small bilateral pleural effusions with basilar airspace disease, atelectasis versus pneumonia. Mild cardiomegaly. Electronically Signed   By:  Jasmine Pang M.D.   On: 03/11/2021 00:17    EKG: Personally reviewed. Sinus tachycardia, rate 114, LAE, T wave inversion V6.  Not significantly changed when compared to prior from 03/10/2021.  Assessment/Plan Principal Problem:   Pulmonary edema   Pamela Bryan is a 50 y.o. female with medical history significant for alcohol and tobacco use who is admitted with suspected acute onset CHF with pulmonary edema.  Suspected acute onset CHF with pulmonary edema: Patient with progressive lower extremity edema, weight gain, orthopnea, DOE, BNP >2000, and CXR with cardiomegaly and pulmonary edema suggestive of acute CHF.  No previously known cardiac history.  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 -Continue diuresis with IV Lasix 40 mg twice daily -Obtain echocardiogram -Monitor strict I/O's and daily weights -Obtain D-dimer, if elevated would then obtain CTA chest PE study and/or lower extremity Dopplers to look for PE/DVT  Alcohol use: Continue CIWA protocol with as needed Ativan.  Tobacco use: Nicotine patch ordered.  DVT prophylaxis: Lovenox Code Status: Full code, confirmed with patient Family Communication: Discussed with patient, she has discussed with family Disposition Plan: From home and likely discharge to home pending adequate diuresis and further work-up Consults called: None Level of care: Telemetry Cardiac Admission status:  Status is: Observation  The patient remains OBS appropriate and will d/c before 2 midnights.  Dispo: The patient is from: Home              Anticipated d/c is to: Home              Patient currently is not medically stable to d/c.   Difficult to place patient No  Darreld Mclean MD [facility]  If 7PM-7AM, please contact night-coverage [email or web address]  03/11/2021, 11:27 PM

## 2021-03-11 NOTE — ED Provider Notes (Signed)
Needs  Emergency Department Provider Note   I have reviewed the triage vital signs and the nursing notes.   HISTORY  Chief Complaint Shortness of Breath   HPI Pamela Bryan is a 50 y.o. female presents to the emergency department with welling in the legs and dyspnea.  Patient developed symptoms in the past  2 weeks.  She has had abdominal distention and difficulty lying flat.  She has had approximately 15 pound weight gain suddenly along with significant swelling in the legs.  She drinks alcohol fairly heavily.  She does mention that symptoms began within a week of her COVID booster but otherwise no new medications or vaccinations.  She is not having active chest pain.  She is feeling some shortness of breath.  She was seen in the emergency department last night with elevated BNP refused admission at that time.  She filled her prescription for Lasix and took 2 doses but has not had significant improvement.  She does note some weight loss and diuresis since starting the p.o. medication.  She returns today with continued symptoms and ready for inpatient evaluation.   Past Medical History:  Diagnosis Date  . CHF (congestive heart failure) (HCC)     There are no problems to display for this patient.   Past Surgical History:  Procedure Laterality Date  . CHOLECYSTECTOMY      Allergies Patient has no known allergies.  No family history on file.  Social History Social History   Tobacco Use  . Smoking status: Current Every Day Smoker  Substance Use Topics  . Alcohol use: Yes    Comment: occasional  . Drug use: No    Review of Systems  Constitutional: No fever/chills Eyes: No visual changes. ENT: No sore throat. Cardiovascular: Denies chest pain. Positive LE edema.  Respiratory: Positive shortness of breath. Gastrointestinal: No abdominal pain.  No nausea, no vomiting.  No diarrhea.  No constipation. Genitourinary: Negative for dysuria. Musculoskeletal: Negative for back  pain. Skin: Negative for rash. Neurological: Negative for headaches, focal weakness or numbness.  10-point ROS otherwise negative.  ____________________________________________   PHYSICAL EXAM:  VITAL SIGNS: ED Triage Vitals  Enc Vitals Group     BP 03/11/21 2003 99/75     Pulse Rate 03/11/21 2003 (!) 125     Resp 03/11/21 2003 18     Temp 03/11/21 2003 97.6 F (36.4 C)     Temp Source 03/11/21 2003 Oral     SpO2 03/11/21 2003 96 %   Constitutional: Alert and oriented. Well appearing and in no acute distress. Eyes: Conjunctivae are normal.  Head: Atraumatic. Nose: No congestion/rhinnorhea. Mouth/Throat: Mucous membranes are moist.  Neck: No stridor.   Cardiovascular: Normal rate, regular rhythm. Good peripheral circulation. Grossly normal heart sounds.   Respiratory: Normal respiratory effort.  No retractions. Lungs CTAB. Gastrointestinal: Soft and nontender. No distention.  Musculoskeletal: No lower extremity tenderness with pitting edema. No gross deformities of extremities. Neurologic:  Normal speech and language. No gross focal neurologic deficits are appreciated.  Skin:  Skin is warm, dry and intact. No rash noted.   ____________________________________________   LABS (all labs ordered are listed, but only abnormal results are displayed)  Labs Reviewed  BASIC METABOLIC PANEL - Abnormal; Notable for the following components:      Result Value   Glucose, Bld 69 (*)    Calcium 8.8 (*)    All other components within normal limits  CBC - Abnormal; Notable for the following components:  Hemoglobin 15.4 (*)    MCH 34.7 (*)    All other components within normal limits  RESP PANEL BY RT-PCR (FLU A&B, COVID) ARPGX2  I-STAT BETA HCG BLOOD, ED ([facility], [facility], [facility] ONLY)   ____________________________________________  EKG   EKG Interpretation  Date/Time:  Wednesday March 11 2021 20:04:42 EDT Ventricular Rate:  118 PR Interval:  164 QRS Duration: 82 QT  Interval:  322 QTC Calculation: 451 R Axis:   64 Text Interpretation: Sinus tachycardia Biatrial enlargement Anteroseptal infarct , age undetermined Abnormal ECG Confirmed by Alona Bene [phone]) on 03/11/2021 9:16:11 PM       ____________________________________________  RADIOLOGY  DG Chest 2 View  Result Date: 03/11/2021 CLINICAL DATA:  Shortness of breath for 2-3 days.  History of CHF. EXAM: CHEST - 2 VIEW COMPARISON:  Radiograph earlier today FINDINGS: Cardiomegaly. Small bilateral pleural effusions are similar. Ovoid density in the left mid lung likely represents fluid in the fissures on the lateral view. Developing peribronchial thickening. No pneumothorax. No acute osseous abnormalities are seen. IMPRESSION: 1. Developing peribronchial thickening, suspicious for pulmonary edema. 2. Cardiomegaly and small bilateral pleural effusions, unchanged. Ovoid density in the left mid lung likely represents fluid in the fissure. Electronically Signed   By: Narda Rutherford M.D.   On: 03/11/2021 20:29   DG Chest Port 1 View  Result Date: 03/11/2021 CLINICAL DATA:  Shortness of breath with leg swelling EXAM: PORTABLE CHEST 1 VIEW COMPARISON:  None. FINDINGS: Small bilateral pleural effusions with basilar airspace disease. Mild cardiomegaly. Ovoid airspace disease in the left mid lung. No pneumothorax. IMPRESSION: Small bilateral pleural effusions with basilar airspace disease, atelectasis versus pneumonia. Mild cardiomegaly. Electronically Signed   By: Jasmine Pang M.D.   On: 03/11/2021 00:17    ____________________________________________   PROCEDURES  Procedure(s) performed:   Procedures  CRITICAL CARE Performed by: Maia Plan Total critical care time: 35 minutes Critical care time was exclusive of separately billable procedures and treating other patients. Critical care was necessary to treat or prevent imminent or life-threatening deterioration. Critical care was time spent  personally by me on the following activities: development of treatment plan with patient and/or surrogate as well as nursing, discussions with consultants, evaluation of patient's response to treatment, examination of patient, obtaining history from patient or surrogate, ordering and performing treatments and interventions, ordering and review of laboratory studies, ordering and review of radiographic studies, pulse oximetry and re-evaluation of patient's condition.  Alona Bene, MD Emergency Medicine  ____________________________________________   INITIAL IMPRESSION / ASSESSMENT AND PLAN / ED COURSE  Pertinent labs & imaging results that were available during my care of the patient were reviewed by me and considered in my medical decision making (see chart for details).   Patient presents to the emergency department for evaluation of shortness of breath, leg swelling, concern for new onset CHF.  She was seen yesterday with elevated BNP.  Chest x-ray from today shows cardiomegaly with bilateral pleural effusions and pulmonary edema.  Patient is not having pneumonia symptoms and so favor atelectasis at the basilar airspace.  Plan for IV Lasix here to continue diuresis and will admit for echo and further work-up per plan from yesterday.  Patient is a heavy alcohol drinker which may be the underlying etiology of her presumed CHF.  I have started her on CIWA protocol along with nicotine patch and Lasix.  Discussed patient's case with [facility] to request admission. Patient and family (if present) updated with plan. Care transferred to [facility] service.  I reviewed all nursing notes, vitals, pertinent old records, EKGs, labs, imaging (as available).  ____________________________________________  FINAL CLINICAL IMPRESSION(S) / ED DIAGNOSES  Final diagnoses:  Acute pulmonary edema (HCC)    MEDICATIONS GIVEN DURING THIS VISIT:  Medications  LORazepam (ATIVAN) injection 1 mg (has no administration in time  range)  nicotine (NICODERM CQ - dosed in mg/24 hours) patch 21 mg (has no administration in time range)  LORazepam (ATIVAN) injection 0-4 mg (has no administration in time range)    Or  LORazepam (ATIVAN) tablet 0-4 mg (has no administration in time range)  LORazepam (ATIVAN) injection 0-4 mg (has no administration in time range)    Or  LORazepam (ATIVAN) tablet 0-4 mg (has no administration in time range)  thiamine tablet 100 mg (has no administration in time range)    Or  thiamine (B-1) injection 100 mg (has no administration in time range)    Note:  This document was prepared using Dragon voice recognition software and may include unintentional dictation errors.  Alona Bene, MD, [facility] Emergency Medicine    Marcques Wrightsman, Arlyss Repress, MD 03/11/21 2237

## 2021-03-11 NOTE — ED Notes (Signed)
Patients Friend exited the Examination Room to advise [place] that the Patient does not wish to be admitted at this time and would like to be discharged.

## 2021-03-11 NOTE — ED Triage Notes (Signed)
Pt reports she was seen at [facility] last night and diagnosed with new onset CHF. C/o shortness of breath, BLE swelling and a 15lb weight gain in the last week.

## 2021-03-11 NOTE — Discharge Instructions (Addendum)
Begin taking Lasix 40 mg in the morning as prescribed today.  Follow-up with cardiology in the next 1 to 2 days.  The contact information for the [facility] cardiology clinic has been provided in this discharge summary for you to call and make these arrangements.  You were offered admission, however have declined and understand the risks of going home.  You are welcome to return to the emergency department at any point should you desire admission and care as recommended this evening.

## 2021-03-11 NOTE — ED Notes (Signed)
Pt educated on discharge paperwork. Pt reports understanding. Ambulatory with steady gait, NAD with friend.

## 2021-03-11 NOTE — ED Provider Notes (Addendum)
Care assumed from Dr. Cathlyn Parsons at shift change.  Patient presenting here with complaints of leg swelling, weight gain, and weakness worsening over the past 2 weeks.  Patient waiting for results of laboratory studies.  Laboratory studies have returned showing an unremarkable CBC and comprehensive metabolic panel.  Her BNP, however was significantly elevated at 2400.  Patient reevaluated and informed of the abnormal results.  I have also added on an EKG, troponin, and chest x-ray.  Patient has findings of LVH with repolarization abnormalities, mild cardiomegaly, and mildly elevated troponin of 98.  Patient appears to have new onset CHF.  She does admit to consuming alcohol regularly and according to her friend at bedside, drinks all day from morning to night.  She also reports that she had consumed alcohol in the car on the way here.  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 Okey Dupre.  Dr. Okey Dupre has suggested patient be admitted for diuresis and further evaluation.  Patient presented with this care plan, however is refusing to be admitted.  She tells me she has things to do at home and has to see her children before she can be admitted to the hospital.  Patient advised that her studies are abnormal and consistent with congestive heart failure and possibly a mild heart attack.  Patient understands the risks and benefits of admission versus discharge and is willing to accept them.  Patient appears to have decision-making capacity and will be discharged on her request.  She will be given Lasix prior to discharge.  I will also prescribe Lasix he can take at home.  A referral will be placed to cardiology for expedited follow-up.  Patient understands she can return at anytime for admission/completion of the care plan that was recommended to her.  ED ECG REPORT   Date: 03/11/2021  Rate: 107  Rhythm: sinus tachycardia  QRS Axis: left  Intervals:  normal  ST/T Wave abnormalities: nonspecific T wave changes, LVH with repolarization abnormality  Conduction Disutrbances:none  Narrative Interpretation:   Old EKG Reviewed: none available  I have personally reviewed the EKG tracing and agree with the computerized printout as noted.    Geoffery Lyons, MD 03/11/21 2820    Geoffery Lyons, MD 03/11/21 [phone]

## 2021-03-12 ENCOUNTER — Observation Stay (HOSPITAL_COMMUNITY): Payer: No Typology Code available for payment source

## 2021-03-12 DIAGNOSIS — J810 Acute pulmonary edema: Secondary | ICD-10-CM | POA: Diagnosis not present

## 2021-03-12 DIAGNOSIS — Z79899 Other long term (current) drug therapy: Secondary | ICD-10-CM | POA: Diagnosis not present

## 2021-03-12 DIAGNOSIS — F17200 Nicotine dependence, unspecified, uncomplicated: Secondary | ICD-10-CM | POA: Diagnosis not present

## 2021-03-12 DIAGNOSIS — I426 Alcoholic cardiomyopathy: Secondary | ICD-10-CM | POA: Diagnosis present

## 2021-03-12 DIAGNOSIS — M7989 Other specified soft tissue disorders: Secondary | ICD-10-CM | POA: Diagnosis present

## 2021-03-12 DIAGNOSIS — Z8249 Family history of ischemic heart disease and other diseases of the circulatory system: Secondary | ICD-10-CM | POA: Diagnosis not present

## 2021-03-12 DIAGNOSIS — I5021 Acute systolic (congestive) heart failure: Secondary | ICD-10-CM | POA: Diagnosis not present

## 2021-03-12 DIAGNOSIS — I5023 Acute on chronic systolic (congestive) heart failure: Secondary | ICD-10-CM | POA: Diagnosis present

## 2021-03-12 DIAGNOSIS — R0600 Dyspnea, unspecified: Secondary | ICD-10-CM

## 2021-03-12 DIAGNOSIS — R0602 Shortness of breath: Secondary | ICD-10-CM | POA: Diagnosis not present

## 2021-03-12 DIAGNOSIS — Z9049 Acquired absence of other specified parts of digestive tract: Secondary | ICD-10-CM | POA: Diagnosis not present

## 2021-03-12 DIAGNOSIS — I361 Nonrheumatic tricuspid (valve) insufficiency: Secondary | ICD-10-CM

## 2021-03-12 DIAGNOSIS — K7031 Alcoholic cirrhosis of liver with ascites: Secondary | ICD-10-CM | POA: Diagnosis not present

## 2021-03-12 DIAGNOSIS — I509 Heart failure, unspecified: Secondary | ICD-10-CM

## 2021-03-12 DIAGNOSIS — I351 Nonrheumatic aortic (valve) insufficiency: Secondary | ICD-10-CM

## 2021-03-12 DIAGNOSIS — R0683 Snoring: Secondary | ICD-10-CM | POA: Diagnosis present

## 2021-03-12 DIAGNOSIS — Z720 Tobacco use: Secondary | ICD-10-CM | POA: Diagnosis not present

## 2021-03-12 DIAGNOSIS — K7460 Unspecified cirrhosis of liver: Secondary | ICD-10-CM | POA: Diagnosis present

## 2021-03-12 DIAGNOSIS — Z20822 Contact with and (suspected) exposure to covid-19: Secondary | ICD-10-CM | POA: Diagnosis present

## 2021-03-12 DIAGNOSIS — I5082 Biventricular heart failure: Secondary | ICD-10-CM | POA: Diagnosis present

## 2021-03-12 DIAGNOSIS — F17210 Nicotine dependence, cigarettes, uncomplicated: Secondary | ICD-10-CM | POA: Diagnosis present

## 2021-03-12 DIAGNOSIS — R7989 Other specified abnormal findings of blood chemistry: Secondary | ICD-10-CM | POA: Diagnosis present

## 2021-03-12 DIAGNOSIS — F1010 Alcohol abuse, uncomplicated: Secondary | ICD-10-CM | POA: Diagnosis present

## 2021-03-12 DIAGNOSIS — I340 Nonrheumatic mitral (valve) insufficiency: Secondary | ICD-10-CM | POA: Diagnosis not present

## 2021-03-12 LAB — RAPID URINE DRUG SCREEN, HOSP PERFORMED
Amphetamines: NOT DETECTED
Barbiturates: NOT DETECTED
Benzodiazepines: NOT DETECTED
Cocaine: NOT DETECTED
Opiates: NOT DETECTED
Tetrahydrocannabinol: NOT DETECTED

## 2021-03-12 LAB — CBC
HCT: 42.8 % (ref 36.0–46.0)
Hemoglobin: 14.9 g/dL (ref 12.0–15.0)
MCH: 33.9 pg (ref 26.0–34.0)
MCHC: 34.8 g/dL (ref 30.0–36.0)
MCV: 97.5 fL (ref 80.0–100.0)
Platelets: 314 10*3/uL (ref 150–400)
RBC: 4.39 MIL/uL (ref 3.87–5.11)
RDW: 13.2 % (ref 11.5–15.5)
WBC: 6.5 10*3/uL (ref 4.0–10.5)
nRBC: 0 % (ref 0.0–0.2)

## 2021-03-12 LAB — BASIC METABOLIC PANEL
Anion gap: 11 (ref 5–15)
BUN: 14 mg/dL (ref 6–20)
CO2: 26 mmol/L (ref 22–32)
Calcium: 8.8 mg/dL — ABNORMAL LOW (ref 8.9–10.3)
Chloride: 99 mmol/L (ref 98–111)
Creatinine, Ser: 0.88 mg/dL (ref 0.44–1.00)
GFR, Estimated: >60 mL/min (ref >60)
Glucose, Bld: 111 mg/dL — ABNORMAL HIGH (ref 70–99)
Potassium: 3.9 mmol/L (ref 3.5–5.1)
Sodium: 136 mmol/L (ref 135–145)

## 2021-03-12 LAB — RESP PANEL BY RT-PCR (FLU A&B, COVID) ARPGX2
Influenza A by PCR: NEGATIVE
Influenza B by PCR: NEGATIVE
SARS Coronavirus 2 by RT PCR: NEGATIVE

## 2021-03-12 LAB — ECHOCARDIOGRAM COMPLETE
Calc EF: 19 %
P 1/2 time: 188 msec
S' Lateral: 6.4 cm
Single Plane A2C EF: 17.2 %
Single Plane A4C EF: 20.5 %

## 2021-03-12 LAB — TROPONIN I (HIGH SENSITIVITY)
Troponin I (High Sensitivity): 84 ng/L — ABNORMAL HIGH (ref <18)
Troponin I (High Sensitivity): 62 ng/L — ABNORMAL HIGH (ref <18)

## 2021-03-12 LAB — MAGNESIUM: Magnesium: 1.8 mg/dL (ref 1.7–2.4)

## 2021-03-12 LAB — TSH: TSH: 3.588 u[IU]/mL (ref 0.350–4.500)

## 2021-03-12 LAB — D-DIMER, QUANTITATIVE: D-Dimer, Quant: 1.74 ug/mL-FEU — ABNORMAL HIGH (ref 0.00–0.50)

## 2021-03-12 LAB — HIV ANTIBODY (ROUTINE TESTING W REFLEX): HIV Screen 4th Generation wRfx: NONREACTIVE

## 2021-03-12 LAB — ETHANOL: Alcohol, Ethyl (B): 122 mg/dL — ABNORMAL HIGH (ref <10)

## 2021-03-12 MED ORDER — SODIUM CHLORIDE 0.9 % IV SOLN
INTRAVENOUS | Status: DC
Start: 2021-03-13 — End: 2021-03-13

## 2021-03-12 MED ORDER — SODIUM CHLORIDE 0.9% FLUSH: 3.0000 mL | INTRAVENOUS | Status: DC | PRN

## 2021-03-12 MED ORDER — IOHEXOL 350 MG/ML SOLN
75.0000 mL | Freq: Once | INTRAVENOUS | Status: AC | PRN
  Administered 2021-03-12: 75 mL via INTRAVENOUS

## 2021-03-12 MED ORDER — SPIRONOLACTONE 12.5 MG HALF TABLET
12.5000 mg | ORAL_TABLET | Freq: Every day | ORAL | Status: DC
  Administered 2021-03-12 – 2021-03-16 (×5): 12.5 mg via ORAL
  Filled 2021-03-12 (×5): qty 1

## 2021-03-12 MED ORDER — ASPIRIN 81 MG PO CHEW
81.0000 mg | CHEWABLE_TABLET | ORAL | Status: AC
  Administered 2021-03-13: 81 mg via ORAL
  Filled 2021-03-12: qty 1

## 2021-03-12 MED ORDER — SODIUM CHLORIDE 0.9 % IV SOLN: 250.0000 mL | INTRAVENOUS | Status: DC | PRN

## 2021-03-12 MED ORDER — SODIUM CHLORIDE 0.9% FLUSH
3.0000 mL | Freq: Two times a day (BID) | INTRAVENOUS | Status: DC
  Administered 2021-03-12 – 2021-03-13 (×2): 3 mL via INTRAVENOUS

## 2021-03-12 MED ORDER — DIGOXIN 125 MCG PO TABS
0.1250 mg | ORAL_TABLET | Freq: Every day | ORAL | Status: DC
  Administered 2021-03-12 – 2021-03-16 (×5): 0.125 mg via ORAL
  Filled 2021-03-12 (×5): qty 1

## 2021-03-12 NOTE — Progress Notes (Signed)
PROGRESS NOTE  Pamela Bryan:[id number] DOB: 1971/06/10 DOA: 03/11/2021 PCP: Patient, No Pcp Per   LOS: 0 days   Brief narrative:  Pamela Bryan is a 50 y.o. female with medical history significant for alcohol and tobacco use who presented to the ED for progressive shortness of breath lower extremity swelling.  Her symptoms started since January. Patient had initially presented to [facility] ED  with report of bilateral leg swelling with 10-15 pound weight gain had refused at present..  Patient continues to smoke cigarettes and drinks wine at nighttime denies any withdrawal symptoms.  In the ED, on this admission, patient was noted to have a borderline low blood pressure with tachycardia but pulse ox was normal . 2 view chest x-ray shows developing peribronchial thickening suspicious for pulmonary edema, cardiomegaly and small bilateral pleural effusions which are unchanged. Patient was given IV Lasix 20 mg once and placed on CIWA protocol with Ativan as needed.    Assessment/Plan:  Principal Problem:   Pulmonary edema Alcohol use disorder  Acute onset CHF with pulmonary edema: Patient presented with progressive weight gain edema dyspnea on exertion orthopnea and typical features of congestive heart failure.  Possibility of alcohol induced cardiomyopathy.  Continue IV diuresis with Lasix twice a day.  Check 2D echocardiogram, strict intake and output charting.  Was done which showed no evidence of pulmonary embolism but pleural effusion.  Alcohol use disorder.: Continue CIWA protocol with as needed Ativan.  Denies withdrawal symptoms at home.  Tobacco use use disorder: Continue nicotine patch.  DVT prophylaxis: enoxaparin (LOVENOX) injection 40 mg Start: 03/11/21 2345   Code Status: Full code  Family Communication: None  Status is: Observation  Patient will need inpatient admission for IV diuresis, work-up of congestive heart failure, further diagnostic test, possible  cardiology evaluation.  Dispo: The patient is from: Home              Anticipated d/c is to: Home              Patient currently is not medically stable to d/c.   Difficult to place patient Yes  Consultants:  None  Procedures:  None  Anti-infectives:  . None  Anti-infectives (From admission, onward)   None      Subjective:  Today, patient was seen and examined at bedside.  Patient states that her breathing is slightly improved.  Orthopnea is still present but slightly better.  Has significant leg swelling.  Denies any tremors hallucinations dizziness lightheadedness.  Objective: Vitals:   03/12/21 0915 03/12/21 0925  BP:    Pulse: (!) 107 (!) 106  Resp: (!) 24 (!) 25  Temp:    SpO2: 96% 97%   No intake or output data in the 24 hours ending 03/12/21 1022 There were no vitals filed for this visit. There is no height or weight on file to calculate BMI.   Physical Exam: GENERAL: Patient is alert awake and oriented. Not in obvious distress.  On room air HENT: No scleral pallor or icterus. Pupils equally reactive to light. Oral mucosa is moist NECK: is supple, no gross swelling noted. CHEST:.  Diminished breath sounds bilaterally.  Coarse breath sounds noted. CVS: S1 and S2 heard, no murmur. Regular rate and rhythm.  ABDOMEN: Soft, non-tender, bowel sounds are present. EXTREMITIES: Bilateral lower extremity pitting edema noted. CNS: Cranial nerves are intact. No focal motor deficits. SKIN: warm and dry without rashes.  Data Review: I have personally reviewed the following laboratory data and studies,  CBC:  Recent Labs  Lab 03/10/21 2253 03/11/21 2008 03/12/21 0240  WBC 8.9 7.2 6.5  HGB 14.5 15.4* 14.9  HCT 41.5 44.4 42.8  MCV 98.8 100.0 97.5  PLT 274 308 314   Basic Metabolic Panel: Recent Labs  Lab 03/10/21 2253 03/11/21 2008 03/12/21 0240  NA 128* 136 136  K 4.1 4.0 3.9  CL 98 101 99  CO2 19* 25 26  GLUCOSE 94 69* 111*  BUN 14 13 14    CREATININE 0.83 0.88 0.88  CALCIUM 8.7* 8.8* 8.8*  MG  --   --  1.8   Liver Function Tests: Recent Labs  Lab 03/10/21 2253  AST 48*  ALT 74*  ALKPHOS 67  BILITOT 0.5  PROT 5.6*  ALBUMIN 3.8   No results for input(s): LIPASE, AMYLASE in the last 168 hours. No results for input(s): AMMONIA in the last 168 hours. Cardiac Enzymes: No results for input(s): CKTOTAL, CKMB, CKMBINDEX, TROPONINI in the last 168 hours. BNP (last 3 results) Recent Labs    03/10/21 2253  BNP 2,348.9*    ProBNP (last 3 results) No results for input(s): PROBNP in the last 8760 hours.  CBG: No results for input(s): GLUCAP in the last 168 hours. Recent Results (from the past 240 hour(s))  Resp Panel by RT-PCR (Flu A&B, Covid) Nasopharyngeal Swab     Status: None   Collection Time: 03/11/21 11:02 PM   Specimen: Nasopharyngeal Swab; Nasopharyngeal(NP) swabs in vial transport medium  Result Value Ref Range Status   SARS Coronavirus 2 by RT PCR NEGATIVE NEGATIVE Final    Comment: (NOTE) SARS-CoV-2 target nucleic acids are NOT DETECTED.  The SARS-CoV-2 RNA is generally detectable in upper respiratory specimens during the acute phase of infection. The lowest concentration of SARS-CoV-2 viral copies this assay can detect is 138 copies/mL. A negative result does not preclude SARS-Cov-2 infection and should not be used as the sole basis for treatment or other patient management decisions. A negative result may occur with  improper specimen collection/handling, submission of specimen other than nasopharyngeal swab, presence of viral mutation(s) within the areas targeted by this assay, and inadequate number of viral copies(<138 copies/mL). A negative result must be combined with clinical observations, patient history, and epidemiological information. The expected result is Negative.  Fact Sheet for Patients:  03/13/21  Fact Sheet for Healthcare Providers:   BloggerCourse.com  This test is no t yet approved or cleared by the [employer] FDA and  has been authorized for detection and/or diagnosis of SARS-CoV-2 by FDA under an Emergency Use Authorization (EUA). This EUA will remain  in effect (meaning this test can be used) for the duration of the COVID-19 declaration under Section 564(b)(1) of the Act, 21 U.S.C.section 360bbb-3(b)(1), unless the authorization is terminated  or revoked sooner.       Influenza A by PCR NEGATIVE NEGATIVE Final   Influenza B by PCR NEGATIVE NEGATIVE Final    Comment: (NOTE) The Xpert Xpress SARS-CoV-2/FLU/RSV plus assay is intended as an aid in the diagnosis of influenza from Nasopharyngeal swab specimens and should not be used as a sole basis for treatment. Nasal washings and aspirates are unacceptable for Xpert Xpress SARS-CoV-2/FLU/RSV testing.  Fact Sheet for Patients: [place]  Fact Sheet for Healthcare Providers: BloggerCourse.com  This test is not yet approved or cleared by the [employer] FDA and has been authorized for detection and/or diagnosis of SARS-CoV-2 by FDA under an Emergency Use Authorization (EUA). This EUA will remain in effect (meaning this test  can be used) for the duration of the COVID-19 declaration under Section 564(b)(1) of the Act, 21 U.S.C. section 360bbb-3(b)(1), unless the authorization is terminated or revoked.  Performed at [facility], [address], [place], [place] [address]      Studies: DG Chest 2 View  Result Date: 03/11/2021 CLINICAL DATA:  Shortness of breath for 2-3 days.  History of CHF. EXAM: CHEST - 2 VIEW COMPARISON:  Radiograph earlier today FINDINGS: Cardiomegaly. Small bilateral pleural effusions are similar. Ovoid density in the left mid lung likely represents fluid in the fissures on the lateral view. Developing peribronchial thickening. No pneumothorax.  No acute osseous abnormalities are seen. IMPRESSION: 1. Developing peribronchial thickening, suspicious for pulmonary edema. 2. Cardiomegaly and small bilateral pleural effusions, unchanged. Ovoid density in the left mid lung likely represents fluid in the fissure. Electronically Signed   By: Narda Rutherford M.D.   On: 03/11/2021 20:29   CT ANGIO CHEST PE W OR WO CONTRAST  Result Date: 03/12/2021 CLINICAL DATA:  Short of breath for 2 days, positive D dimer EXAM: CT ANGIOGRAPHY CHEST WITH CONTRAST TECHNIQUE: Multidetector CT imaging of the chest was performed using the standard protocol during bolus administration of intravenous contrast. Multiplanar CT image reconstructions and MIPs were obtained to evaluate the vascular anatomy. CONTRAST:  48mL OMNIPAQUE IOHEXOL 350 MG/ML SOLN COMPARISON:  03/11/2021 FINDINGS: Cardiovascular: This is a technically adequate evaluation of the pulmonary vasculature. No filling defects or pulmonary emboli. Heart is markedly enlarged, with severe left ventricular dilatation. No pericardial effusion. 4.1 cm ascending thoracic aortic aneurysm identified. No evidence of dissection. Mediastinum/Nodes: No pathologic adenopathy. Thyroid, trachea, and esophagus are unremarkable. Lungs/Pleura: There is a small right pleural effusion. There is dense right lower lobe atelectasis. Subsegmental atelectasis is seen within the lingula and left lower lobe. A small rounded area of ground-glass airspace disease is seen at the right apex, measuring up to 2.5 cm in diameter, favor focal inflammation or infection. No pneumothorax. Central airways are patent. Upper Abdomen: No acute abnormality. Musculoskeletal: No acute displaced fracture. Reconstructed images demonstrate no additional findings. Review of the MIP images confirms the above findings. IMPRESSION: 1. No evidence of pulmonary embolus. 2. Marked cardiomegaly, with pronounced left ventricular dilatation. 3. Small right pleural effusion. 4.  Rounded ground-glass consolidation within the right upper lobe, favor inflammation or infection. 5. 4.1 cm ascending thoracic aortic aneurysm. Recommend annual imaging followup by CTA or MRA. This recommendation follows 2010 ACCF/AHA/AATS/ACR/ASA/SCA/SCAI/SIR/STS/SVM Guidelines for the Diagnosis and Management of Patients with Thoracic Aortic Disease. Circulation. 2010; 121: E720-N470. Aortic aneurysm NOS (ICD10-I71.9) Electronically Signed   By: Sharlet Salina M.D.   On: 03/12/2021 02:52   DG Chest Port 1 View  Result Date: 03/11/2021 CLINICAL DATA:  Shortness of breath with leg swelling EXAM: PORTABLE CHEST 1 VIEW COMPARISON:  None. FINDINGS: Small bilateral pleural effusions with basilar airspace disease. Mild cardiomegaly. Ovoid airspace disease in the left mid lung. No pneumothorax. IMPRESSION: Small bilateral pleural effusions with basilar airspace disease, atelectasis versus pneumonia. Mild cardiomegaly. Electronically Signed   By: Jasmine Pang M.D.   On: 03/11/2021 00:17      Joycelyn Das, MD  [facility] 03/12/2021  If 7PM-7AM, please contact night-coverage

## 2021-03-12 NOTE — Progress Notes (Signed)
Patient brought to 4E from ED. Wall monitor telemetry applied, CCMD notified. CHG bath completed. VSS. Patient oriented to room and staff. Call bell in reach.  Kenard Gower, RN

## 2021-03-12 NOTE — Progress Notes (Signed)
  Echocardiogram 2D Echocardiogram has been performed.  Delcie Roch 03/12/2021, 11:14 AM

## 2021-03-12 NOTE — Progress Notes (Signed)
VASCULAR LAB    Bilateral lower extremity venous duplex has been performed.  See CV proc for preliminary results.   Nathan Stallworth, RVT 03/12/2021, 1:52 PM

## 2021-03-12 NOTE — ED Notes (Signed)
Patient transported to CT 

## 2021-03-12 NOTE — Consult Note (Addendum)
Advanced Heart Failure Team Consult Note   Primary Physician: none Primary Cardiologist:  none  Reason for Consultation: acute systolic CHF  HPI:    Pamela Bryan is seen today for evaluation of Acute systolic CHF at the request of Dr. Tyson Babinski, Rebekah [place].   50 yo female with PMH of alcohol use disorder, tobacco use disorder.    She presents with worsening bilateral leg edema, weight gain 10-15lb, cough since Januray.  Initially went to [facility] ED on 3/15 found to have BNP 2400, hs troponin 98, chest x-ray with pulm edema and bilateral effusions.  On call cardiologist recommended admission but patient was insistent on leaving after 40mg  oral lasix.  She was given a prescription for 40mg  daily.  She returned 3/16 with worsening symptoms and admitted to [place] for chf exacerbation.  Says her symptoms started in January first noticed orthopnea and PND.  Only developed lower extremity edema a few weeks ago.  She doesn't note anything out of the ordinary, had no viral symptoms and is vaccinated for covid.  Never had any chest pain episodes, but did note episodes of diaphoresis with exertion.  Sister has bicuspid valve but otherwise no cardiac family history.    Taking adderall that she gets from a friend, last dose was last week.  Denies any recent cocaine use but says she did use this in her twenties.  Drinking 3 glasses of wine or 3 mixed drinks daily by her estimate but more on the weekends.    She reports she has been urinating a lot since admission, with some improvement in breathing.    Review of Systems: [y] = yes, [ ]  = no   General: Weight gain [place] ]; Weight loss [ ] ; Anorexia [ ] ; Fatigue [ ] ; Fever [ ] ; Chills [ ] ; Weakness [ ]   Cardiac: Chest pain/pressure [ ] ; Resting SOB [place] ]; Exertional SOB [ y]; Orthopnea [ y]; Pedal Edema [place] ]; Palpitations [ ] ; Syncope [ ] ; Presyncope [ ] ; Paroxysmal nocturnal dyspnea[ ]   Pulmonary: Cough [place] ]; Wheezing[ ] ; Hemoptysis[ ] ; Sputum [ ] ;  Snoring [ ]   GI: Vomiting[ ] ; Dysphagia[ ] ; Melena[ ] ; Hematochezia [ ] ; Heartburn[ ] ; Abdominal pain [ ] ; Constipation [ ] ; Diarrhea [ ] ; BRBPR [ ]   GU: Hematuria[ ] ; Dysuria [ ] ; Nocturia[ ]   Vascular: Pain in legs with walking [ ] ; Pain in feet with lying flat [ ] ; Non-healing sores [ ] ; Stroke [ ] ; TIA [ ] ; Slurred speech [ ] ;  Neuro: Headaches[ ] ; Vertigo[ ] ; Seizures[ ] ; Paresthesias[ ] ;Blurred vision [ ] ; Diplopia [ ] ; Vision changes [ ]   Ortho/Skin: Arthritis [ ] ; Joint pain [ ] ; Muscle pain [ ] ; Joint swelling [ ] ; Back Pain [ ] ; Rash [ ]   Psych: Depression[y ]; Anxiety[ ]   Heme: Bleeding problems [ ] ; Clotting disorders [ ] ; Anemia [ ]   Endocrine: Diabetes [ ] ; Thyroid dysfunction[ ]   Home Medications Prior to Admission medications   Medication Sig Start Date End Date Taking? Authorizing Provider  furosemide (LASIX) 40 MG tablet Take 1 tablet (40 mg total) by mouth daily. 03/11/21  Yes Delo, Riley Lam, MD  levonorgestrel (LILETTA, 52 MG,) 19.5 MCG/DAY IUD IUD 1 Intra Uterine Device by Intrauterine route. 04/23/16  Yes [provider]  Multiple Vitamin (MULTIVITAMIN) capsule Take 1 capsule by mouth daily.   Yes [provider]  doxycycline (VIBRAMYCIN) 100 MG capsule Take 100 mg by mouth See admin instructions. Bid x 10 days Patient not taking:  No sig reported 02/22/21   [provider]  predniSONE (DELTASONE) 20 MG tablet Take 40 mg by mouth See admin instructions. 40mg  daily x 5 days Patient not taking: No sig reported    [provider]    Past Medical History: Past Medical History:  Diagnosis Date   CHF (congestive heart failure) (HCC)     Past Surgical History: Past Surgical History:  Procedure Laterality Date   CHOLECYSTECTOMY      Family History: Family History  Problem Relation Age of Onset   Valvular heart disease Sister     Social History: Social History   Socioeconomic History   Marital status: Divorced    Spouse  name: Not on file   Number of children: Not on file   Years of education: Not on file   Highest education level: Not on file  Occupational History   Not on file  Tobacco Use   Smoking status: Current Every Day Smoker   Smokeless tobacco: Not on file  Substance and Sexual Activity   Alcohol use: Yes    Comment: occasional   Drug use: No   Sexual activity: Not on file  Other Topics Concern   Not on file  Social History Narrative   Not on file   Social Determinants of Health   Financial Resource Strain: Not on file  Food Insecurity: Not on file  Transportation Needs: Not on file  Physical Activity: Not on file  Stress: Not on file  Social Connections: Not on file    Allergies:  No Known Allergies  Objective:    Vital Signs:   Temp:  [97.6 F (36.4 C)-98.2 F (36.8 C)] 98.1 F (36.7 C) (03/17 1315) Pulse Rate:  [39-125] 113 (03/17 1400) Resp:  [18-32] 24 (03/17 1400) BP: (91-126)/(64-86) 111/80 (03/17 1330) SpO2:  [92 %-100 %] 98 % (03/17 1400)    Weight change: There were no vitals filed for this visit.  Intake/Output:  No intake or output data in the 24 hours ending 03/12/21 1421    Physical Exam    General:  Older than stated age, mild dyspnea HEENT: normal Neck: supple. JVP 12 . Carotids 2+ bilat; no bruits. No lymphadenopathy or thyromegaly appreciated. Cor: PMI nondisplaced. Regular rate & rhythm. No rubs, gallops or murmurs. Lungs: bibasilar rales, decreased sounds in bases R>L Abdomen: soft, nontender, nondistended. No hepatosplenomegaly. No bruits or masses. Good bowel sounds. Extremities: warm,  1+ edema Neuro: alert & orientedx3, cranial nerves grossly intact. moves all 4 extremities w/o difficulty. Affect pleasant   Telemetry   Sinus tach 115  EKG    Sinus tach 107, LVH  Labs   Basic Metabolic Panel: Recent Labs  Lab 03/10/21 2253 03/11/21 2008 03/12/21 0240  NA 128* 136 136  K 4.1 4.0 3.9  CL 98 101 99  CO2 19* 25  26  GLUCOSE 94 69* 111*  BUN 14 13 14   CREATININE 0.83 0.88 0.88  CALCIUM 8.7* 8.8* 8.8*  MG  --   --  1.8    Liver Function Tests: Recent Labs  Lab 03/10/21 2253  AST 48*  ALT 74*  ALKPHOS 67  BILITOT 0.5  PROT 5.6*  ALBUMIN 3.8   No results for input(s): LIPASE, AMYLASE in the last 168 hours. No results for input(s): AMMONIA in the last 168 hours.  CBC: Recent Labs  Lab 03/10/21 2253 03/11/21 2008 03/12/21 0240  WBC 8.9 7.2 6.5  HGB 14.5 15.4* 14.9  HCT 41.5 44.4 42.8  MCV 98.8 100.0 97.5  PLT 274 308 314    Cardiac Enzymes: No results for input(s): CKTOTAL, CKMB, CKMBINDEX, TROPONINI in the last 168 hours.  BNP: BNP (last 3 results) Recent Labs    03/10/21 2253  BNP 2,348.9*    ProBNP (last 3 results) No results for input(s): PROBNP in the last 8760 hours.   CBG: No results for input(s): GLUCAP in the last 168 hours.  Coagulation Studies: No results for input(s): LABPROT, INR in the last 72 hours.   Imaging   DG Chest 2 View  Result Date: 03/11/2021 CLINICAL DATA:  Shortness of breath for 2-3 days.  History of CHF. EXAM: CHEST - 2 VIEW COMPARISON:  Radiograph earlier today FINDINGS: Cardiomegaly. Small bilateral pleural effusions are similar. Ovoid density in the left mid lung likely represents fluid in the fissures on the lateral view. Developing peribronchial thickening. No pneumothorax. No acute osseous abnormalities are seen. IMPRESSION: 1. Developing peribronchial thickening, suspicious for pulmonary edema. 2. Cardiomegaly and small bilateral pleural effusions, unchanged. Ovoid density in the left mid lung likely represents fluid in the fissure. Electronically Signed   By: Narda RutherfordMelanie  Sanford M.D.   On: 03/11/2021 20:29   CT ANGIO CHEST PE W OR WO CONTRAST  Result Date: 03/12/2021 CLINICAL DATA:  Short of breath for 2 days, positive D dimer EXAM: CT ANGIOGRAPHY CHEST WITH CONTRAST TECHNIQUE: Multidetector CT imaging of the chest was performed  using the standard protocol during bolus administration of intravenous contrast. Multiplanar CT image reconstructions and MIPs were obtained to evaluate the vascular anatomy. CONTRAST:  75mL OMNIPAQUE IOHEXOL 350 MG/ML SOLN COMPARISON:  03/11/2021 FINDINGS: Cardiovascular: This is a technically adequate evaluation of the pulmonary vasculature. No filling defects or pulmonary emboli. Heart is markedly enlarged, with severe left ventricular dilatation. No pericardial effusion. 4.1 cm ascending thoracic aortic aneurysm identified. No evidence of dissection. Mediastinum/Nodes: No pathologic adenopathy. Thyroid, trachea, and esophagus are unremarkable. Lungs/Pleura: There is a small right pleural effusion. There is dense right lower lobe atelectasis. Subsegmental atelectasis is seen within the lingula and left lower lobe. A small rounded area of ground-glass airspace disease is seen at the right apex, measuring up to 2.5 cm in diameter, favor focal inflammation or infection. No pneumothorax. Central airways are patent. Upper Abdomen: No acute abnormality. Musculoskeletal: No acute displaced fracture. Reconstructed images demonstrate no additional findings. Review of the MIP images confirms the above findings. IMPRESSION: 1. No evidence of pulmonary embolus. 2. Marked cardiomegaly, with pronounced left ventricular dilatation. 3. Small right pleural effusion. 4. Rounded ground-glass consolidation within the right upper lobe, favor inflammation or infection. 5. 4.1 cm ascending thoracic aortic aneurysm. Recommend annual imaging followup by CTA or MRA. This recommendation follows 2010 ACCF/AHA/AATS/ACR/ASA/SCA/SCAI/SIR/STS/SVM Guidelines for the Diagnosis and Management of Patients with Thoracic Aortic Disease. Circulation. 2010; 121: N629-B284: E266-e369. Aortic aneurysm NOS (ICD10-I71.9) Electronically Signed   By: Sharlet SalinaMichael  Brown M.D.   On: 03/12/2021 02:52   VAS US LOWER EXTREMITY VENOUS (DVT)  Result Date: 03/12/2021  Lower  Venous DVT Study Indications: Swelling, and SOB.  Comparison Study: No prior study on file Performing Technologist: Sherren Kernsandace Kanady RVS  Examination Guidelines: A complete evaluation includes B-mode imaging, spectral Doppler, color Doppler, and power Doppler as needed of all accessible portions of each vessel. Bilateral testing is considered an integral part of a complete examination. Limited examinations for reoccurring indications may be performed as noted. The reflux portion of the exam is performed with the patient in reverse Trendelenburg.  +---------+---------------+---------+-----------+----------+-------------------+  RIGHT  Compressibility Phasicity Spontaneity Properties Thrombus Aging       +---------+---------------+---------+-----------+----------+-------------------+  CFV       Full                                             pulsatile waveforms  +---------+---------------+---------+-----------+----------+-------------------+  SFJ       Full                                                                  +---------+---------------+---------+-----------+----------+-------------------+  FV Prox   Full                                                                  +---------+---------------+---------+-----------+----------+-------------------+  FV Mid    Full                                                                  +---------+---------------+---------+-----------+----------+-------------------+  FV Distal Full                                                                  +---------+---------------+---------+-----------+----------+-------------------+  PFV       Full                                                                  +---------+---------------+---------+-----------+----------+-------------------+  POP       Full                                             pulsatile waveforms  +---------+---------------+---------+-----------+----------+-------------------+  PTV       Full                                                                   +---------+---------------+---------+-----------+----------+-------------------+  PERO      Full                                                                  +---------+---------------+---------+-----------+----------+-------------------+   +---------+---------------+---------+-----------+----------+-------------------+  LEFT      Compressibility Phasicity Spontaneity Properties Thrombus Aging       +---------+---------------+---------+-----------+----------+-------------------+  CFV       Full                                             pulsatile waveforms  +---------+---------------+---------+-----------+----------+-------------------+  SFJ       Full                                                                  +---------+---------------+---------+-----------+----------+-------------------+  FV Prox   Full                                                                  +---------+---------------+---------+-----------+----------+-------------------+  FV Mid    Full                                                                  +---------+---------------+---------+-----------+----------+-------------------+  FV Distal Full                                                                  +---------+---------------+---------+-----------+----------+-------------------+  PFV       Full                                                                  +---------+---------------+---------+-----------+----------+-------------------+  POP       Full                                             pulsatile waveforms  +---------+---------------+---------+-----------+----------+-------------------+  PTV       Full                                                                  +---------+---------------+---------+-----------+----------+-------------------+  PERO      Full                                                                   +---------+---------------+---------+-----------+----------+-------------------+  Summary: RIGHT: - There is no evidence of deep vein thrombosis in the lower extremity.  LEFT: - There is no evidence of deep vein thrombosis in the lower extremity.  - A cystic structure is found in the popliteal fossa.  *See table(s) above for measurements and observations.    Preliminary       Medications:     Current Medications:  enoxaparin (LOVENOX) injection  40 mg Subcutaneous QHS   furosemide  40 mg Intravenous Q12H   LORazepam  0-4 mg Intravenous Q6H   Or   LORazepam  0-4 mg Oral Q6H   [START ON 03/14/2021] LORazepam  0-4 mg Intravenous Q12H   Or   [START ON 03/14/2021] LORazepam  0-4 mg Oral Q12H   nicotine  21 mg Transdermal Daily   potassium chloride  20 mEq Oral Daily   thiamine  100 mg Oral Daily   Or   thiamine  100 mg Intravenous Daily     Infusions:   Assessment/Plan   Acute systolic CHF, Biventricular CHF -ECHO 3/17 report pending my interpretation is severely reduced EF ~ 15%, global hypokinesis, mild-mod AI, mild-mod MR, mild TR, severely dilated LV, dilated IVC -Likely substance abuse related, alcohol vs recreational drugs but cannot rule out ischemic heart disease with smoking history other workup to follow initial results -R/LHC scheduled for tomorrow -diuresing on IV lasix, renal function stable, continue -add gdmt as bp allows, avoid any beta blockade for now  -add TSH, add UDS  Alcohol Use disorder: -on CIWA discussed cessation  Tobacco Use disorder: -on nicotine patch, discussed cessation  Length of Stay: 0  Angelita Ingles, MD  03/12/2021, 2:21 PM  Advanced Heart Failure Team Pager [phone] (M-F; 7a - 4p)  Please contact [facility] for night-coverage after hours (4p -7a ) and weekends on [email or web address]  Patient seen and examined with the above-signed Advanced Practice Provider and/or Housestaff. I personally reviewed laboratory data, imaging  studies and relevant notes. I independently examined the patient and formulated the important aspects of the plan. I have edited the note to reflect any of my changes or salient points. I have personally discussed the plan with the patient and/or family.  50 y/o smoker with ETOH abuse. No h/o heart disease. Presents with several week h/o increasing edema and SOB. Echo shows LV EF 15% RV moderately down. ECG with LVH with repol  General:  Sleeping hard to arouse HEENT: normal Neck: supple. JVP to ear  Carotids 2+ bilat; no bruits. No lymphadenopathy or thryomegaly appreciated. Cor: PMI nondisplaced.tachy regular  +s3 Lungs: clear Abdomen: soft, nontender, + distended. No hepatosplenomegaly. No bruits or masses. Good bowel sounds. Extremities: no cyanosis, clubbing, rash, 2+ edema Neuro: alert & orientedx3, cranial nerves grossly intact. moves all 4 extremities w/o difficulty. Affect pleasant  She has acute systolic HF with severe biventricular HF. Suspect NICM (ETOH?) but also at high risk for iCM. ECG shows LVH but denies h/o HTN.   Will need IV diuresis. Start GDMT. Plan R/L cath tomorrow.   Arvilla Meres, MD  4:03 PM

## 2021-03-12 NOTE — ED Notes (Signed)
Pt denied alcohol use when asked

## 2021-03-13 ENCOUNTER — Encounter (HOSPITAL_COMMUNITY)
Admission: EM | Disposition: A | Discharge status: Home / Self Care | Payer: Self-pay | Source: Home / Self Care | Attending: Internal Medicine

## 2021-03-13 DIAGNOSIS — I5021 Acute systolic (congestive) heart failure: Secondary | ICD-10-CM

## 2021-03-13 DIAGNOSIS — Z720 Tobacco use: Secondary | ICD-10-CM

## 2021-03-13 DIAGNOSIS — J810 Acute pulmonary edema: Secondary | ICD-10-CM | POA: Diagnosis not present

## 2021-03-13 DIAGNOSIS — F1010 Alcohol abuse, uncomplicated: Secondary | ICD-10-CM | POA: Diagnosis not present

## 2021-03-13 HISTORY — PX: RIGHT/LEFT HEART CATH AND CORONARY ANGIOGRAPHY: CATH118266

## 2021-03-13 LAB — POCT I-STAT EG7
Acid-Base Excess: 0 mmol/L (ref 0.0–2.0)
Acid-Base Excess: 3 mmol/L — ABNORMAL HIGH (ref 0.0–2.0)
Acid-Base Excess: 4 mmol/L — ABNORMAL HIGH (ref 0.0–2.0)
Bicarbonate: 24.6 mmol/L (ref 20.0–28.0)
Bicarbonate: 28.1 mmol/L — ABNORMAL HIGH (ref 20.0–28.0)
Bicarbonate: 28.7 mmol/L — ABNORMAL HIGH (ref 20.0–28.0)
Calcium, Ion: 0.86 mmol/L — CL (ref 1.15–1.40)
Calcium, Ion: 1.15 mmol/L (ref 1.15–1.40)
Calcium, Ion: 1.2 mmol/L (ref 1.15–1.40)
HCT: 41 % (ref 36.0–46.0)
HCT: 47 % — ABNORMAL HIGH (ref 36.0–46.0)
HCT: 48 % — ABNORMAL HIGH (ref 36.0–46.0)
Hemoglobin: 13.9 g/dL (ref 12.0–15.0)
Hemoglobin: 16 g/dL — ABNORMAL HIGH (ref 12.0–15.0)
Hemoglobin: 16.3 g/dL — ABNORMAL HIGH (ref 12.0–15.0)
O2 Saturation: 74 %
O2 Saturation: 78 %
O2 Saturation: 86 %
Potassium: 3.8 mmol/L (ref 3.5–5.1)
Potassium: 4.5 mmol/L (ref 3.5–5.1)
Potassium: 4.7 mmol/L (ref 3.5–5.1)
Sodium: 136 mmol/L (ref 135–145)
Sodium: 138 mmol/L (ref 135–145)
Sodium: 142 mmol/L (ref 135–145)
TCO2: 26 mmol/L (ref 22–32)
TCO2: 29 mmol/L (ref 22–32)
TCO2: 30 mmol/L (ref 22–32)
pCO2, Ven: 37.4 mmHg — ABNORMAL LOW (ref 44.0–60.0)
pCO2, Ven: 42.8 mmHg — ABNORMAL LOW (ref 44.0–60.0)
pCO2, Ven: 44 mmHg (ref 44.0–60.0)
pH, Ven: 7.413 (ref 7.250–7.430)
pH, Ven: 7.425 (ref 7.250–7.430)
pH, Ven: 7.434 — ABNORMAL HIGH (ref 7.250–7.430)
pO2, Ven: 39 mmHg (ref 32.0–45.0)
pO2, Ven: 42 mmHg (ref 32.0–45.0)
pO2, Ven: 50 mmHg — ABNORMAL HIGH (ref 32.0–45.0)

## 2021-03-13 LAB — POCT I-STAT 7, (LYTES, BLD GAS, ICA,H+H)
Acid-base deficit: 3 mmol/L — ABNORMAL HIGH (ref 0.0–2.0)
Bicarbonate: 20 mmol/L (ref 20.0–28.0)
Calcium, Ion: 0.66 mmol/L — CL (ref 1.15–1.40)
HCT: 35 % — ABNORMAL LOW (ref 36.0–46.0)
Hemoglobin: 11.9 g/dL — ABNORMAL LOW (ref 12.0–15.0)
O2 Saturation: 98 %
Potassium: 3 mmol/L — ABNORMAL LOW (ref 3.5–5.1)
Sodium: 149 mmol/L — ABNORMAL HIGH (ref 135–145)
TCO2: 21 mmol/L — ABNORMAL LOW (ref 22–32)
pCO2 arterial: 30.4 mmHg — ABNORMAL LOW (ref 32.0–48.0)
pH, Arterial: 7.426 (ref 7.350–7.450)
pO2, Arterial: 100 mmHg (ref 83.0–108.0)

## 2021-03-13 LAB — COMPREHENSIVE METABOLIC PANEL
ALT: 55 U/L — ABNORMAL HIGH (ref 0–44)
AST: 32 U/L (ref 15–41)
Albumin: 3.1 g/dL — ABNORMAL LOW (ref 3.5–5.0)
Alkaline Phosphatase: 71 U/L (ref 38–126)
Anion gap: 10 (ref 5–15)
BUN: 19 mg/dL (ref 6–20)
CO2: 25 mmol/L (ref 22–32)
Calcium: 8.9 mg/dL (ref 8.9–10.3)
Chloride: 99 mmol/L (ref 98–111)
Creatinine, Ser: 0.95 mg/dL (ref 0.44–1.00)
GFR, Estimated: >60 mL/min (ref >60)
Glucose, Bld: 121 mg/dL — ABNORMAL HIGH (ref 70–99)
Potassium: 3.7 mmol/L (ref 3.5–5.1)
Sodium: 134 mmol/L — ABNORMAL LOW (ref 135–145)
Total Bilirubin: 1.3 mg/dL — ABNORMAL HIGH (ref 0.3–1.2)
Total Protein: 5.4 g/dL — ABNORMAL LOW (ref 6.5–8.1)

## 2021-03-13 LAB — MAGNESIUM: Magnesium: 1.8 mg/dL (ref 1.7–2.4)

## 2021-03-13 LAB — CBC
HCT: 42.8 % (ref 36.0–46.0)
Hemoglobin: 14.9 g/dL (ref 12.0–15.0)
MCH: 33.9 pg (ref 26.0–34.0)
MCHC: 34.8 g/dL (ref 30.0–36.0)
MCV: 97.5 fL (ref 80.0–100.0)
Platelets: 300 10*3/uL (ref 150–400)
RBC: 4.39 MIL/uL (ref 3.87–5.11)
RDW: 13.2 % (ref 11.5–15.5)
WBC: 7.4 10*3/uL (ref 4.0–10.5)
nRBC: 0 % (ref 0.0–0.2)

## 2021-03-13 LAB — LIPID PANEL
Cholesterol: 157 mg/dL (ref 0–200)
HDL: 68 mg/dL (ref >40)
LDL Cholesterol: 80 mg/dL (ref 0–99)
Total CHOL/HDL Ratio: 2.3 RATIO
Triglycerides: 46 mg/dL (ref <150)
VLDL: 9 mg/dL (ref 0–40)

## 2021-03-13 LAB — PHOSPHORUS: Phosphorus: 5 mg/dL — ABNORMAL HIGH (ref 2.5–4.6)

## 2021-03-13 LAB — POCT ACTIVATED CLOTTING TIME: Activated Clotting Time: 142 seconds

## 2021-03-13 LAB — BRAIN NATRIURETIC PEPTIDE: B Natriuretic Peptide: 2254.2 pg/mL — ABNORMAL HIGH (ref 0.0–100.0)

## 2021-03-13 SURGERY — RIGHT/LEFT HEART CATH AND CORONARY ANGIOGRAPHY
Anesthesia: LOCAL

## 2021-03-13 MED ORDER — MAGNESIUM SULFATE 2 GM/50ML IV SOLN
2.0000 g | Freq: Once | INTRAVENOUS | Status: AC
  Administered 2021-03-13: 2 g via INTRAVENOUS
  Filled 2021-03-13: qty 50

## 2021-03-13 MED ORDER — LIDOCAINE HCL (PF) 1 % IJ SOLN
INTRAMUSCULAR | Status: AC
  Filled 2021-03-13: qty 30

## 2021-03-13 MED ORDER — SODIUM CHLORIDE 0.9% FLUSH: 3.0000 mL | INTRAVENOUS | Status: DC | PRN

## 2021-03-13 MED ORDER — VERAPAMIL HCL 2.5 MG/ML IV SOLN
INTRAVENOUS | Status: AC
  Filled 2021-03-13: qty 2

## 2021-03-13 MED ORDER — ENOXAPARIN SODIUM 40 MG/0.4ML ~~LOC~~ SOLN
40.0000 mg | SUBCUTANEOUS | Status: DC
  Administered 2021-03-14 – 2021-03-16 (×3): 40 mg via SUBCUTANEOUS
  Filled 2021-03-13 (×3): qty 0.4

## 2021-03-13 MED ORDER — HEPARIN SODIUM (PORCINE) 1000 UNIT/ML IJ SOLN
INTRAMUSCULAR | Status: AC
  Filled 2021-03-13: qty 1

## 2021-03-13 MED ORDER — LIDOCAINE HCL (PF) 1 % IJ SOLN
INTRAMUSCULAR | Status: DC | PRN
  Administered 2021-03-13: 5 mL

## 2021-03-13 MED ORDER — VERAPAMIL HCL 2.5 MG/ML IV SOLN
INTRAVENOUS | Status: DC | PRN
  Administered 2021-03-13: 10 mL via INTRA_ARTERIAL

## 2021-03-13 MED ORDER — POTASSIUM CHLORIDE CRYS ER 20 MEQ PO TBCR
40.0000 meq | EXTENDED_RELEASE_TABLET | Freq: Once | ORAL | Status: AC
  Administered 2021-03-13: 40 meq via ORAL
  Filled 2021-03-13: qty 2

## 2021-03-13 MED ORDER — LABETALOL HCL 5 MG/ML IV SOLN: 10.0000 mg | INTRAVENOUS | Status: AC | PRN

## 2021-03-13 MED ORDER — SODIUM CHLORIDE 0.9 % IV SOLN
250.0000 mL | INTRAVENOUS | Status: DC | PRN
Start: 2021-03-13 — End: 2021-03-16

## 2021-03-13 MED ORDER — MIDAZOLAM HCL 2 MG/2ML IJ SOLN
INTRAMUSCULAR | Status: AC
  Filled 2021-03-13: qty 2

## 2021-03-13 MED ORDER — IOHEXOL 350 MG/ML SOLN
INTRAVENOUS | Status: DC | PRN
  Administered 2021-03-13: 20 mL

## 2021-03-13 MED ORDER — FENTANYL CITRATE (PF) 100 MCG/2ML IJ SOLN
INTRAMUSCULAR | Status: AC
  Filled 2021-03-13: qty 2

## 2021-03-13 MED ORDER — ACETAMINOPHEN 325 MG PO TABS
650.0000 mg | ORAL_TABLET | ORAL | Status: DC | PRN
  Filled 2021-03-13: qty 2

## 2021-03-13 MED ORDER — HEPARIN SODIUM (PORCINE) 1000 UNIT/ML IJ SOLN
INTRAMUSCULAR | Status: DC | PRN
  Administered 2021-03-13: 3500 [IU] via INTRAVENOUS

## 2021-03-13 MED ORDER — SODIUM CHLORIDE 0.9% FLUSH
3.0000 mL | Freq: Two times a day (BID) | INTRAVENOUS | Status: DC
  Administered 2021-03-13 – 2021-03-16 (×5): 3 mL via INTRAVENOUS

## 2021-03-13 MED ORDER — HEPARIN (PORCINE) IN NACL 1000-0.9 UT/500ML-% IV SOLN
INTRAVENOUS | Status: AC
  Filled 2021-03-13: qty 1000

## 2021-03-13 MED ORDER — HEPARIN (PORCINE) IN NACL 1000-0.9 UT/500ML-% IV SOLN
INTRAVENOUS | Status: DC | PRN
  Administered 2021-03-13 (×2): 500 mL

## 2021-03-13 MED ORDER — ONDANSETRON HCL 4 MG/2ML IJ SOLN
4.0000 mg | Freq: Four times a day (QID) | INTRAMUSCULAR | Status: DC | PRN
  Filled 2021-03-13: qty 2

## 2021-03-13 MED ORDER — HYDRALAZINE HCL 20 MG/ML IJ SOLN
10.0000 mg | INTRAMUSCULAR | Status: AC | PRN
Start: 2021-03-13 — End: 2021-03-14

## 2021-03-13 MED ORDER — FENTANYL CITRATE (PF) 100 MCG/2ML IJ SOLN
INTRAMUSCULAR | Status: DC | PRN
  Administered 2021-03-13: 25 ug via INTRAVENOUS

## 2021-03-13 MED ORDER — MIDAZOLAM HCL 2 MG/2ML IJ SOLN
INTRAMUSCULAR | Status: DC | PRN
  Administered 2021-03-13: 1 mg via INTRAVENOUS

## 2021-03-13 SURGICAL SUPPLY — 10 items
CATH 5FR JL3.5 JR4 ANG PIG MP (CATHETERS) ×2 IMPLANT
CATH BALLN WEDGE 5F 110CM (CATHETERS) ×2 IMPLANT
DEVICE RAD COMP TR BAND LRG (VASCULAR PRODUCTS) ×2 IMPLANT
GLIDESHEATH SLEND SS 6F .021 (SHEATH) ×2 IMPLANT
GUIDEWIRE INQWIRE 1.5J.035X260 (WIRE) ×1 IMPLANT
INQWIRE 1.5J .035X260CM (WIRE) ×2
KIT HEART LEFT (KITS) ×2 IMPLANT
PACK CARDIAC CATHETERIZATION (CUSTOM PROCEDURE TRAY) ×2 IMPLANT
SHEATH GLIDE SLENDER 4/5FR (SHEATH) ×2 IMPLANT
TRANSDUCER W/STOPCOCK (MISCELLANEOUS) ×2 IMPLANT

## 2021-03-13 NOTE — Interval H&P Note (Signed)
History and Physical Interval Note:  03/13/2021 5:29 PM  Pamela Bryan  has presented today for surgery, with the diagnosis of chest pain.  The various methods of treatment have been discussed with the patient and family. After consideration of risks, benefits and other options for treatment, the patient has consented to  Procedure(s): RIGHT/LEFT HEART CATH AND CORONARY ANGIOGRAPHY (N/A) and possible coronary angioplasty as a surgical intervention.  The patient's history has been reviewed, patient examined, no change in status, stable for surgery.  I have reviewed the patient's chart and labs.  Questions were answered to the patient's satisfaction.     Jaicion Laurie

## 2021-03-13 NOTE — Progress Notes (Signed)
Advanced Heart Failure Team Rounding Note   Primary Physician: none Primary Cardiologist:  none  Reason for Consultation: acute systolic biventricular CHF  HPI:    She reports breathing more comfortably today, good urine output with IV lasix.  She denies any chest pain.  She was able to sleep last night.     Objective:    Vital Signs:   Temp:  [98 F (36.7 C)-98.7 F (37.1 C)] 98 F (36.7 C) (03/18 0853) Pulse Rate:  [99-119] 106 (03/18 0853) Resp:  [18-30] 20 (03/18 0853) BP: (99-111)/(68-85) 101/78 (03/18 0853) SpO2:  [92 %-100 %] 98 % (03/18 0853) Weight:  [68.9 kg-79.3 kg] 68.9 kg (03/18 0550) Last BM Date: 03/10/21  Weight change: Filed Weights   03/13/21 0318 03/13/21 0550  Weight: 79.3 kg 68.9 kg    Intake/Output:   Intake/Output Summary (Last 24 hours) at 03/13/2021 1204 Last data filed at 03/13/2021 0430 Gross per 24 hour  Intake --  Output 400 ml  Net -400 ml      Physical Exam    Cardiac: JVD 11, mildly tachycardic with normal rhythm, clear s1 and s2, 2/6 diastolic murmur, no LE edema Pulmonary: decreased breath sounds RLL, faint rales LLL, not in distress Abdominal: non distended abdomen, soft and nontender Psych: Alert, conversant, in good spirits    Telemetry   Sinus tach  ~100  EKG    No new ecg  Labs   Basic Metabolic Panel: Recent Labs  Lab 03/10/21 2253 03/11/21 2008 03/12/21 0240 03/13/21 0144  NA 128* 136 136 134*  K 4.1 4.0 3.9 3.7  CL 98 101 99 99  CO2 19* 25 26 25   GLUCOSE 94 69* 111* 121*  BUN 14 13 14 19   CREATININE 0.83 0.88 0.88 0.95  CALCIUM 8.7* 8.8* 8.8* 8.9  MG  --   --  1.8 1.8  PHOS  --   --   --  5.0*    Liver Function Tests: Recent Labs  Lab 03/10/21 2253 03/13/21 0144  AST 48* 32  ALT 74* 55*  ALKPHOS 67 71  BILITOT 0.5 1.3*  PROT 5.6* 5.4*  ALBUMIN 3.8 3.1*   No results for input(s): LIPASE, AMYLASE in the last 168 hours. No results for input(s): AMMONIA in the last 168  hours.  CBC: Recent Labs  Lab 03/10/21 2253 03/11/21 2008 03/12/21 0240 03/13/21 0144  WBC 8.9 7.2 6.5 7.4  HGB 14.5 15.4* 14.9 14.9  HCT 41.5 44.4 42.8 42.8  MCV 98.8 100.0 97.5 97.5  PLT 274 308 314 300    Cardiac Enzymes: No results for input(s): CKTOTAL, CKMB, CKMBINDEX, TROPONINI in the last 168 hours.  BNP: BNP (last 3 results) Recent Labs    03/10/21 2253  BNP 2,348.9*    ProBNP (last 3 results) No results for input(s): PROBNP in the last 8760 hours.   CBG: No results for input(s): GLUCAP in the last 168 hours.  Coagulation Studies: No results for input(s): LABPROT, INR in the last 72 hours.     Assessment/Plan   Acute systolic CHF, Biventricular CHF -ECHO 3/17 report pending my interpretation is severely reduced EF ~ 15%, global hypokinesis, mild-mod AI, mild-mod MR, mild TR, severely dilated LV, dilated IVC -Likely substance abuse related, alcohol vs recreational drugs but cannot rule out ischemic heart disease with smoking history other workup to follow initial results -snores loudly and stops breathing during the night according to significant other, will need sleep study outpatient -R/LHC today -diuresing on  IV lasix, renal function stable, volume status improving, HR and breathing improved continue -TSH normal, UDS negative -started on digoxin and low dose spiro yesterday, will hold off on adding further GDMT today as she will get cath and bp may not tolerate -avoid bb for now with severely low EF and decompensation  Alcohol Use disorder: -on CIWA discussed cessation  Tobacco Use disorder: -on nicotine patch, discussed cessation  Length of Stay: 1  Angelita Ingles, MD  03/13/2021, 12:04 PM  Advanced Heart Failure Team Pager [phone] (M-F; 7a - 4p)  Please contact [facility] for night-coverage after hours (4p -7a ) and weekends on [email or web address]

## 2021-03-13 NOTE — Research (Signed)
[place] Informed Consent   Subject Name: Pamela Bryan  Subject met inclusion and exclusion criteria.  The informed consent form, study requirements and expectations were reviewed with the subject and questions and concerns were addressed prior to the signing of the consent form.  The subject verbalized understanding of the trail requirements.  The subject agreed to participate in the [facility] trial and signed the informed consent.  The informed consent was obtained prior to performance of any protocol-specific procedures for the subject.  A copy of the signed informed consent was given to the subject and a copy was placed in the subject's medical record.  Philemon Kingdom D 03/13/2021, [id number]

## 2021-03-13 NOTE — Progress Notes (Signed)
PROGRESS NOTE  Pamela Bryan WUJ:[id number] DOB: 10-10-1971 DOA: 03/11/2021 PCP: Patient, No Pcp Per   LOS: 1 day   Brief narrative:  Pamela Bryan is a 50 y.o. female with medical history significant for alcohol and tobacco use who presented to the ED for progressive shortness of breath lower extremity swelling.  Her symptoms started since January. She reported bilateral leg swelling with 10-15 pound weight gain.  Patient continues to smoke cigarettes and drinks wine at nighttime, denies any withdrawal symptoms.  In the ED, on this admission, patient was noted to have a borderline low blood pressure with tachycardia but pulse ox was normal . Two view chest x-ray shows developing peribronchial thickening suspicious for pulmonary edema, cardiomegaly and small bilateral pleural effusions which are unchanged. Patient was given IV Lasix 20 mg once and placed on CIWA protocol with Ativan as needed.    Patient was seen by cardiology for significantly low systolic function and decompensated congestive heart failure..  At this time, cardiology is waiting for cardiac cath.  Assessment/Plan:  Principal Problem:   Pulmonary edema Active Problems:   CHF (congestive heart failure) (HCC) Alcohol use disorder  Acute onset CHF with pulmonary edema: Patient presented with progressive weight gain, edema, dyspnea on exertion, orthopnea and typical features of congestive heart failure.  Still complains of dyspnea on exertion after IV diuresis.  Possibility of alcohol induced cardiomyopathy, recreational drug usage induced.  Unable to rule out ischemic pathology.  Cardiology is on board and recommending cardiac catheterization today due to echocardiogram showing severely reduced ejection fraction around 15%.  Mild to moderate MR AI.  Continue IV diuresis with Lasix twice a day.  Continue strict intake and output charting.  CTA was done which showed no evidence of pulmonary embolism but pleural effusion.  Continue  aspirin, digoxin, spironolactone.  Alcohol use disorder.: Continue CIWA protocol with as needed Ativan.  Denies withdrawal symptoms at home.  Tobacco use use disorder: Continue nicotine patch.  Counseling was done.  DVT prophylaxis: enoxaparin (LOVENOX) injection 40 mg Start: 03/11/21 2345   Code Status: Full code  Family Communication: None  Status is: Inpatient  Patient is inpatient admission for IV diuresis, work-up of congestive heart failure, cardiac catheterization,   Dispo: The patient is from: Home              Anticipated d/c is to: Home              Patient currently is not medically stable to d/c.   Difficult to place patient Yes  Consultants:  Cardiology  Procedures:  None  Anti-infectives:  . None  Anti-infectives (From admission, onward)   None      Subjective: Today, patient was seen and examined at bedside.  Patient states that her breathing has improved overall but has dyspnea on exertion.  Denies any chest pain dizziness lightheadedness.  Swelling has improved in her lower extremity.   Objective: Vitals:   03/13/21 0500 03/13/21 0853  BP:  101/78  Pulse:  (!) 106  Resp: 20 20  Temp:  98 F (36.7 C)  SpO2:  98%    Intake/Output Summary (Last 24 hours) at 03/13/2021 0936 Last data filed at 03/13/2021 0430 Gross per 24 hour  Intake -  Output 400 ml  Net -400 ml   Filed Weights   03/13/21 0318 03/13/21 0550  Weight: 79.3 kg 68.9 kg   Body mass index is 23.81 kg/m.   Physical Exam: General:  Average built, not in obvious distress  HENT:   No scleral pallor or icterus noted. Oral mucosa is moist.  Chest:   Diminished breath sounds bilaterally.  Coarse breath sounds noted. CVS: S1 &S2 heard. No murmur.  Regular rate and rhythm. Abdomen: Soft, nontender, nondistended.  Bowel sounds are heard.   Extremities: No cyanosis, clubbing with trace pedal edema.  Peripheral pulses are palpable. Psych: Alert, awake and oriented, normal  mood CNS:  No cranial nerve deficits.  Power equal in all extremities.   Skin: Warm and dry.  No rashes noted.  Data Review: I have personally reviewed the following laboratory data and studies,  CBC: Recent Labs  Lab 03/10/21 2253 03/11/21 2008 03/12/21 0240 03/13/21 0144  WBC 8.9 7.2 6.5 7.4  HGB 14.5 15.4* 14.9 14.9  HCT 41.5 44.4 42.8 42.8  MCV 98.8 100.0 97.5 97.5  PLT 274 308 314 300   Basic Metabolic Panel: Recent Labs  Lab 03/10/21 2253 03/11/21 2008 03/12/21 0240 03/13/21 0144  NA 128* 136 136 134*  K 4.1 4.0 3.9 3.7  CL 98 101 99 99  CO2 19* 25 26 25   GLUCOSE 94 69* 111* 121*  BUN 14 13 14 19   CREATININE 0.83 0.88 0.88 0.95  CALCIUM 8.7* 8.8* 8.8* 8.9  MG  --   --  1.8 1.8  PHOS  --   --   --  5.0*   Liver Function Tests: Recent Labs  Lab 03/10/21 2253 03/13/21 0144  AST 48* 32  ALT 74* 55*  ALKPHOS 67 71  BILITOT 0.5 1.3*  PROT 5.6* 5.4*  ALBUMIN 3.8 3.1*   No results for input(s): LIPASE, AMYLASE in the last 168 hours. No results for input(s): AMMONIA in the last 168 hours. Cardiac Enzymes: No results for input(s): CKTOTAL, CKMB, CKMBINDEX, TROPONINI in the last 168 hours. BNP (last 3 results) Recent Labs    03/10/21 2253  BNP 2,348.9*    ProBNP (last 3 results) No results for input(s): PROBNP in the last 8760 hours.  CBG: No results for input(s): GLUCAP in the last 168 hours. Recent Results (from the past 240 hour(s))  Resp Panel by RT-PCR (Flu A&B, Covid) Nasopharyngeal Swab     Status: None   Collection Time: 03/11/21 11:02 PM   Specimen: Nasopharyngeal Swab; Nasopharyngeal(NP) swabs in vial transport medium  Result Value Ref Range Status   SARS Coronavirus 2 by RT PCR NEGATIVE NEGATIVE Final    Comment: (NOTE) SARS-CoV-2 target nucleic acids are NOT DETECTED.  The SARS-CoV-2 RNA is generally detectable in upper respiratory specimens during the acute phase of infection. The lowest concentration of SARS-CoV-2 viral copies this  assay can detect is 138 copies/mL. A negative result does not preclude SARS-Cov-2 infection and should not be used as the sole basis for treatment or other patient management decisions. A negative result may occur with  improper specimen collection/handling, submission of specimen other than nasopharyngeal swab, presence of viral mutation(s) within the areas targeted by this assay, and inadequate number of viral copies(<138 copies/mL). A negative result must be combined with clinical observations, patient history, and epidemiological information. The expected result is Negative.  Fact Sheet for Patients:  2254  Fact Sheet for Healthcare Providers:  03/13/21  This test is no t yet approved or cleared by the [employer] FDA and  has been authorized for detection and/or diagnosis of SARS-CoV-2 by FDA under an Emergency Use Authorization (EUA). This EUA will remain  in effect (meaning this test can be used) for the duration of the  COVID-19 declaration under Section 564(b)(1) of the Act, 21 U.S.C.section 360bbb-3(b)(1), unless the authorization is terminated  or revoked sooner.       Influenza A by PCR NEGATIVE NEGATIVE Final   Influenza B by PCR NEGATIVE NEGATIVE Final    Comment: (NOTE) The Xpert Xpress SARS-CoV-2/FLU/RSV plus assay is intended as an aid in the diagnosis of influenza from Nasopharyngeal swab specimens and should not be used as a sole basis for treatment. Nasal washings and aspirates are unacceptable for Xpert Xpress SARS-CoV-2/FLU/RSV testing.  Fact Sheet for Patients: [employer]  Fact Sheet for Healthcare Providers: [employer]it  This test is not yet approved or cleared by the [place] FDA and has been authorized for detection and/or diagnosis of SARS-CoV-2 by FDA under an Emergency Use Authorization (EUA). This EUA will  remain in effect (meaning this test can be used) for the duration of the COVID-19 declaration under Section 564(b)(1) of the Act, 21 U.S.C. section 360bbb-3(b)(1), unless the authorization is terminated or revoked.  Performed at [facility], [address], [place], [place] [address]      Studies: DG Chest 2 View  Result Date: 03/11/2021 CLINICAL DATA:  Shortness of breath for 2-3 days.  History of CHF. EXAM: CHEST - 2 VIEW COMPARISON:  Radiograph earlier today FINDINGS: Cardiomegaly. Small bilateral pleural effusions are similar. Ovoid density in the left mid lung likely represents fluid in the fissures on the lateral view. Developing peribronchial thickening. No pneumothorax. No acute osseous abnormalities are seen. IMPRESSION: 1. Developing peribronchial thickening, suspicious for pulmonary edema. 2. Cardiomegaly and small bilateral pleural effusions, unchanged. Ovoid density in the left mid lung likely represents fluid in the fissure. Electronically Signed   By: Narda Rutherford M.D.   On: 03/11/2021 20:29   CT ANGIO CHEST PE W OR WO CONTRAST  Result Date: 03/12/2021 CLINICAL DATA:  Short of breath for 2 days, positive D dimer EXAM: CT ANGIOGRAPHY CHEST WITH CONTRAST TECHNIQUE: Multidetector CT imaging of the chest was performed using the standard protocol during bolus administration of intravenous contrast. Multiplanar CT image reconstructions and MIPs were obtained to evaluate the vascular anatomy. CONTRAST:  75mL OMNIPAQUE IOHEXOL 350 MG/ML SOLN COMPARISON:  03/11/2021 FINDINGS: Cardiovascular: This is a technically adequate evaluation of the pulmonary vasculature. No filling defects or pulmonary emboli. Heart is markedly enlarged, with severe left ventricular dilatation. No pericardial effusion. 4.1 cm ascending thoracic aortic aneurysm identified. No evidence of dissection. Mediastinum/Nodes: No pathologic adenopathy. Thyroid, trachea, and esophagus are unremarkable. Lungs/Pleura:  There is a small right pleural effusion. There is dense right lower lobe atelectasis. Subsegmental atelectasis is seen within the lingula and left lower lobe. A small rounded area of ground-glass airspace disease is seen at the right apex, measuring up to 2.5 cm in diameter, favor focal inflammation or infection. No pneumothorax. Central airways are patent. Upper Abdomen: No acute abnormality. Musculoskeletal: No acute displaced fracture. Reconstructed images demonstrate no additional findings. Review of the MIP images confirms the above findings. IMPRESSION: 1. No evidence of pulmonary embolus. 2. Marked cardiomegaly, with pronounced left ventricular dilatation. 3. Small right pleural effusion. 4. Rounded ground-glass consolidation within the right upper lobe, favor inflammation or infection. 5. 4.1 cm ascending thoracic aortic aneurysm. Recommend annual imaging followup by CTA or MRA. This recommendation follows 2010 ACCF/AHA/AATS/ACR/ASA/SCA/SCAI/SIR/STS/SVM Guidelines for the Diagnosis and Management of Patients with Thoracic Aortic Disease. Circulation. 2010; 121: U045-W098. Aortic aneurysm NOS (ICD10-I71.9) Electronically Signed   By: Sharlet Salina M.D.   On: 03/12/2021 02:52  VAS [place] LOWER EXTREMITY VENOUS (DVT)  Result Date: 03/12/2021  Lower Venous DVT Study Indications: Swelling, and SOB.  Comparison Study: No prior study on file Performing Technologist: Sherren Kerns RVS  Examination Guidelines: A complete evaluation includes B-mode imaging, spectral Doppler, color Doppler, and power Doppler as needed of all accessible portions of each vessel. Bilateral testing is considered an integral part of a complete examination. Limited examinations for reoccurring indications may be performed as noted. The reflux portion of the exam is performed with the patient in reverse Trendelenburg.  +---------+---------------+---------+-----------+----------+-------------------+ RIGHT     CompressibilityPhasicitySpontaneityPropertiesThrombus Aging      +---------+---------------+---------+-----------+----------+-------------------+ CFV      Full                                         pulsatile waveforms +---------+---------------+---------+-----------+----------+-------------------+ SFJ      Full                                                             +---------+---------------+---------+-----------+----------+-------------------+ FV Prox  Full                                                             +---------+---------------+---------+-----------+----------+-------------------+ FV Mid   Full                                                             +---------+---------------+---------+-----------+----------+-------------------+ FV DistalFull                                                             +---------+---------------+---------+-----------+----------+-------------------+ PFV      Full                                                             +---------+---------------+---------+-----------+----------+-------------------+ POP      Full                                         pulsatile waveforms +---------+---------------+---------+-----------+----------+-------------------+ PTV      Full                                                             +---------+---------------+---------+-----------+----------+-------------------+  PERO     Full                                                             +---------+---------------+---------+-----------+----------+-------------------+   +---------+---------------+---------+-----------+----------+-------------------+ LEFT     CompressibilityPhasicitySpontaneityPropertiesThrombus Aging      +---------+---------------+---------+-----------+----------+-------------------+ CFV      Full                                         pulsatile waveforms  +---------+---------------+---------+-----------+----------+-------------------+ SFJ      Full                                                             +---------+---------------+---------+-----------+----------+-------------------+ FV Prox  Full                                                             +---------+---------------+---------+-----------+----------+-------------------+ FV Mid   Full                                                             +---------+---------------+---------+-----------+----------+-------------------+ FV DistalFull                                                             +---------+---------------+---------+-----------+----------+-------------------+ PFV      Full                                                             +---------+---------------+---------+-----------+----------+-------------------+ POP      Full                                         pulsatile waveforms +---------+---------------+---------+-----------+----------+-------------------+ PTV      Full                                                             +---------+---------------+---------+-----------+----------+-------------------+ PERO     Full                                                             +---------+---------------+---------+-----------+----------+-------------------+  Summary: RIGHT: - There is no evidence of deep vein thrombosis in the lower extremity.  LEFT: - There is no evidence of deep vein thrombosis in the lower extremity.  - A cystic structure is found in the popliteal fossa.  *See table(s) above for measurements and observations. Electronically signed by Lemar Livings MD on 03/12/2021 at 5:52:53 PM.    Final       Joycelyn Das, MD  [facility] 03/13/2021  If 7PM-7AM, please contact night-coverage

## 2021-03-13 NOTE — Progress Notes (Addendum)
Pt came back to rm 19 from cath lab. reinitated tele. Vss. . Call bell within reach.   Lawson Radar, RN

## 2021-03-13 NOTE — Progress Notes (Addendum)
Pt accidentally moved the R wrist. Bleeding at the R site. Re inflated 4cc into TR band. Pt asymptomatic. Will continue to monitor the pt.

## 2021-03-13 NOTE — Progress Notes (Signed)
Advanced Heart Failure Team Rounding Note   Primary Physician: none Primary Cardiologist:  none  Reason for Consultation: acute systolic biventricular CHF  HPI:    She reports breathing more comfortably today, good urine output with IV lasix.  She denies any chest pain.  She was able to sleep last night.     Objective:    Vital Signs:   Temp:  [97.7 F (36.5 C)-98.3 F (36.8 C)] 98 F (36.7 C) (03/18 1855) Pulse Rate:  [98-106] 106 (03/18 1821) Resp:  [18-20] 20 (03/18 1855) BP: (93-132)/(67-78) 100/76 (03/18 1855) SpO2:  [91 %-99 %] 94 % (03/18 1855) Weight:  [68.9 kg-79.3 kg] 68.9 kg (03/18 0550) Last BM Date: 03/10/21  Weight change: Filed Weights   03/13/21 0318 03/13/21 0550  Weight: 79.3 kg 68.9 kg    Intake/Output:   Intake/Output Summary (Last 24 hours) at 03/13/2021 1927 Last data filed at 03/13/2021 1729 Gross per 24 hour  Intake 80 ml  Output 400 ml  Net -320 ml      Physical Exam    Cardiac: JVD 11, mildly tachycardic with normal rhythm, clear s1 and s2, 2/6 diastolic murmur, no LE edema Pulmonary: decreased breath sounds RLL, faint rales LLL, not in distress Abdominal: non distended abdomen, soft and nontender Psych: Alert, conversant, in good spirits    Telemetry   Sinus tach  ~100  EKG    No new ecg  Labs   Basic Metabolic Panel: Recent Labs  Lab 03/10/21 2253 03/11/21 2008 03/12/21 0240 03/13/21 0144 03/13/21 1750 03/13/21 1752 03/13/21 1756 03/13/21 1800  NA 128* 136 136 134* 149* 138 136 142  K 4.1 4.0 3.9 3.7 3.0* 4.5 4.7 3.8  CL 98 101 99 99  --   --   --   --   CO2 19* 25 26 25   --   --   --   --   GLUCOSE 94 69* 111* 121*  --   --   --   --   BUN 14 13 14 19   --   --   --   --   CREATININE 0.83 0.88 0.88 0.95  --   --   --   --   CALCIUM 8.7* 8.8* 8.8* 8.9  --   --   --   --   MG  --   --  1.8 1.8  --   --   --   --   PHOS  --   --   --  5.0*  --   --   --   --     Liver Function Tests: Recent Labs  Lab  03/10/21 2253 03/13/21 0144  AST 48* 32  ALT 74* 55*  ALKPHOS 67 71  BILITOT 0.5 1.3*  PROT 5.6* 5.4*  ALBUMIN 3.8 3.1*   No results for input(s): LIPASE, AMYLASE in the last 168 hours. No results for input(s): AMMONIA in the last 168 hours.  CBC: Recent Labs  Lab 03/10/21 2253 03/11/21 2008 03/12/21 0240 03/13/21 0144 03/13/21 1750 03/13/21 1752 03/13/21 1756 03/13/21 1800  WBC 8.9 7.2 6.5 7.4  --   --   --   --   HGB 14.5 15.4* 14.9 14.9 11.9* 16.0* 16.3* 13.9  HCT 41.5 44.4 42.8 42.8 35.0* 47.0* 48.0* 41.0  MCV 98.8 100.0 97.5 97.5  --   --   --   --   PLT 274 308 314 300  --   --   --   --  Cardiac Enzymes: No results for input(s): CKTOTAL, CKMB, CKMBINDEX, TROPONINI in the last 168 hours.  BNP: BNP (last 3 results) Recent Labs    03/10/21 2253  BNP 2,348.9*    ProBNP (last 3 results) No results for input(s): PROBNP in the last 8760 hours.   CBG: No results for input(s): GLUCAP in the last 168 hours.  Coagulation Studies: No results for input(s): LABPROT, INR in the last 72 hours.     Assessment/Plan   Acute systolic CHF, Biventricular CHF -ECHO 3/17 report pending my interpretation is severely reduced EF ~ 15%, global hypokinesis, mild-mod AI, mild-mod MR, mild TR, severely dilated LV, dilated IVC -Likely substance abuse related, alcohol vs recreational drugs but cannot rule out ischemic heart disease with smoking history other workup to follow initial results -snores loudly and stops breathing during the night according to significant other, will need sleep study outpatient -R/LHC today -diuresing on IV lasix, renal function stable, volume status improving, HR and breathing improved continue -TSH normal, UDS negative -started on digoxin and low dose spiro yesterday, will hold off on adding further GDMT today as she will get cath and bp may not tolerate -avoid bb for now with severely low EF and decompensation  Alcohol Use disorder: -on CIWA  discussed cessation  Tobacco Use disorder: -on nicotine patch, discussed cessation  Length of Stay: 1  Arvilla Meres, MD  03/13/2021, 7:27 PM  Advanced Heart Failure Team Pager [phone] (M-F; 7a - 4p)  Please contact [facility] for night-coverage after hours (4p -7a ) and weekends on [email or web address]  Patient seen and examined with the above-signed Advanced Practice Provider and/or Housestaff. I personally reviewed laboratory data, imaging studies and relevant notes. I independently examined the patient and formulated the important aspects of the plan. I have edited the note to reflect any of my changes or salient points. I have personally discussed the plan with the patient and/or family.  Diuresing well with IV lasix. Breathing better. SBP running low but asymptomatic. No DTs.   Cath today with normal coronaries. EF 15% and elevated filling pressures.   General:  Well appearing. No resp difficulty HEENT: normal Neck: supple. JVP 7 Carotids 2+ bilat; no bruits. No lymphadenopathy or thryomegaly appreciated. Cor: PMI nondisplaced. Regular rate & rhythm. +s3 Lungs: clear Abdomen: soft, nontender, nondistended. No hepatosplenomegaly. No bruits or masses. Good bowel sounds. Extremities: no cyanosis, clubbing, rash, 1-2 edema Neuro: alert & orientedx3, cranial nerves grossly intact. moves all 4 extremities w/o difficulty. 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 Continue diuresis. Titrate GDMT. Check liver u/s to evalaute for cirrhosis given high output on cath. Consider cMRI.  Arvilla Meres, MD  7:29 PM

## 2021-03-13 NOTE — H&P (View-Only) (Signed)
Advanced Heart Failure Team Rounding Note   Primary Physician: none Primary Cardiologist:  none  Reason for Consultation: acute systolic biventricular CHF  HPI:    She reports breathing more comfortably today, good urine output with IV lasix.  She denies any chest pain.  She was able to sleep last night.     Objective:    Vital Signs:   Temp:  [98 F (36.7 C)-98.7 F (37.1 C)] 98 F (36.7 C) (03/18 0853) Pulse Rate:  [99-119] 106 (03/18 0853) Resp:  [18-30] 20 (03/18 0853) BP: (99-111)/(68-85) 101/78 (03/18 0853) SpO2:  [92 %-100 %] 98 % (03/18 0853) Weight:  [68.9 kg-79.3 kg] 68.9 kg (03/18 0550) Last BM Date: 03/10/21  Weight change: Filed Weights   03/13/21 0318 03/13/21 0550  Weight: 79.3 kg 68.9 kg    Intake/Output:   Intake/Output Summary (Last 24 hours) at 03/13/2021 1204 Last data filed at 03/13/2021 0430 Gross per 24 hour  Intake --  Output 400 ml  Net -400 ml      Physical Exam    Cardiac: JVD 11, mildly tachycardic with normal rhythm, clear s1 and s2, 2/6 diastolic murmur, no LE edema Pulmonary: decreased breath sounds RLL, faint rales LLL, not in distress Abdominal: non distended abdomen, soft and nontender Psych: Alert, conversant, in good spirits    Telemetry   Sinus tach  ~100  EKG    No new ecg  Labs   Basic Metabolic Panel: Recent Labs  Lab 03/10/21 2253 03/11/21 2008 03/12/21 0240 03/13/21 0144  NA 128* 136 136 134*  K 4.1 4.0 3.9 3.7  CL 98 101 99 99  CO2 19* 25 26 25   GLUCOSE 94 69* 111* 121*  BUN 14 13 14 19   CREATININE 0.83 0.88 0.88 0.95  CALCIUM 8.7* 8.8* 8.8* 8.9  MG  --   --  1.8 1.8  PHOS  --   --   --  5.0*    Liver Function Tests: Recent Labs  Lab 03/10/21 2253 03/13/21 0144  AST 48* 32  ALT 74* 55*  ALKPHOS 67 71  BILITOT 0.5 1.3*  PROT 5.6* 5.4*  ALBUMIN 3.8 3.1*   No results for input(s): LIPASE, AMYLASE in the last 168 hours. No results for input(s): AMMONIA in the last 168  hours.  CBC: Recent Labs  Lab 03/10/21 2253 03/11/21 2008 03/12/21 0240 03/13/21 0144  WBC 8.9 7.2 6.5 7.4  HGB 14.5 15.4* 14.9 14.9  HCT 41.5 44.4 42.8 42.8  MCV 98.8 100.0 97.5 97.5  PLT 274 308 314 300    Cardiac Enzymes: No results for input(s): CKTOTAL, CKMB, CKMBINDEX, TROPONINI in the last 168 hours.  BNP: BNP (last 3 results) Recent Labs    03/10/21 2253  BNP 2,348.9*    ProBNP (last 3 results) No results for input(s): PROBNP in the last 8760 hours.   CBG: No results for input(s): GLUCAP in the last 168 hours.  Coagulation Studies: No results for input(s): LABPROT, INR in the last 72 hours.     Assessment/Plan   Acute systolic CHF, Biventricular CHF -ECHO 3/17 report pending my interpretation is severely reduced EF ~ 15%, global hypokinesis, mild-mod AI, mild-mod MR, mild TR, severely dilated LV, dilated IVC -Likely substance abuse related, alcohol vs recreational drugs but cannot rule out ischemic heart disease with smoking history other workup to follow initial results -snores loudly and stops breathing during the night according to significant other, will need sleep study outpatient -R/LHC today -diuresing on  IV lasix, renal function stable, volume status improving, HR and breathing improved continue -TSH normal, UDS negative -started on digoxin and low dose spiro yesterday, will hold off on adding further GDMT today as she will get cath and bp may not tolerate -avoid bb for now with severely low EF and decompensation  Alcohol Use disorder: -on CIWA discussed cessation  Tobacco Use disorder: -on nicotine patch, discussed cessation  Length of Stay: 1  Joellen Tullos B Jennfer Gassen, MD  03/13/2021, 12:04 PM  Advanced Heart Failure Team Pager [phone] (M-F; 7a - 4p)  Please contact [facility] for night-coverage after hours (4p -7a ) and weekends on [email or web address]    

## 2021-03-14 ENCOUNTER — Inpatient Hospital Stay (HOSPITAL_COMMUNITY): Payer: No Typology Code available for payment source

## 2021-03-14 DIAGNOSIS — F1010 Alcohol abuse, uncomplicated: Secondary | ICD-10-CM

## 2021-03-14 DIAGNOSIS — I5021 Acute systolic (congestive) heart failure: Secondary | ICD-10-CM | POA: Diagnosis not present

## 2021-03-14 DIAGNOSIS — F17200 Nicotine dependence, unspecified, uncomplicated: Secondary | ICD-10-CM

## 2021-03-14 DIAGNOSIS — J810 Acute pulmonary edema: Secondary | ICD-10-CM | POA: Diagnosis not present

## 2021-03-14 LAB — COMPREHENSIVE METABOLIC PANEL
ALT: 45 U/L — ABNORMAL HIGH (ref 0–44)
AST: 30 U/L (ref 15–41)
Albumin: 3.2 g/dL — ABNORMAL LOW (ref 3.5–5.0)
Alkaline Phosphatase: 77 U/L (ref 38–126)
Anion gap: 10 (ref 5–15)
BUN: 19 mg/dL (ref 6–20)
CO2: 23 mmol/L (ref 22–32)
Calcium: 9.1 mg/dL (ref 8.9–10.3)
Chloride: 102 mmol/L (ref 98–111)
Creatinine, Ser: 0.93 mg/dL (ref 0.44–1.00)
GFR, Estimated: >60 mL/min (ref >60)
Glucose, Bld: 122 mg/dL — ABNORMAL HIGH (ref 70–99)
Potassium: 4.7 mmol/L (ref 3.5–5.1)
Sodium: 135 mmol/L (ref 135–145)
Total Bilirubin: 1.2 mg/dL (ref 0.3–1.2)
Total Protein: 5.5 g/dL — ABNORMAL LOW (ref 6.5–8.1)

## 2021-03-14 LAB — MAGNESIUM: Magnesium: 2.1 mg/dL (ref 1.7–2.4)

## 2021-03-14 LAB — PHOSPHORUS: Phosphorus: 5.3 mg/dL — ABNORMAL HIGH (ref 2.5–4.6)

## 2021-03-14 NOTE — Progress Notes (Signed)
PROGRESS NOTE    Pamela Bryan  ZOX:[id number] DOB: July 02, 1971 DOA: 03/11/2021 PCP: Patient, No Pcp Per   Brief Narrative:  Pamela Bryan a 50 y.o.femalewith medical history significant foralcohol and tobacco use who presented to the ED for progressive shortness of breath lower extremity swelling.  Her symptoms started since January. She reported bilateral leg swelling with 10-15 pound weight gain. Patient continues to smoke cigarettes and drinks wine at nighttime, denies any withdrawal symptoms.  In the ED, on this admission, patient was noted to have a borderline low blood pressure with tachycardia but pulse ox was normal . Two view chest x-ray shows developing peribronchial thickening suspicious for pulmonary edema, cardiomegaly and small bilateral pleural effusions which are unchanged. Patient was given IV Lasix 20 mg once and placed on CIWA protocol with Ativan as needed.   Patient was seen by cardiology for significantly low systolic function and decompensated congestive heart failure..  At this time, cardiology is waiting for cardiac cath   Assessment & Plan:   Principal Problem:   Pulmonary edema Active Problems:   CHF (congestive heart failure) (HCC)   Acute systolic heart failure (HCC)   Acute onset systolic CHF with pulmonary edema: Patient presented with progressive weight gain, edema, dyspnea on exertion, orthopnea and typical features of congestive heart failure.  Still complains of dyspnea on exertion after IV diuresis.  Possibility of alcohol induced cardiomyopathy, recreational drug usage noted.   Cardiology is on board and recommended cardiac catheterization due to echocardiogram showing severely reduced ejection fraction around 15%. (no acute disease noted) Mild to moderate MR AI.  Continue IV diuresis with Lasix twice a day.  Continue strict intake and output charting.  CTA was done which showed no evidence of pulmonary embolism but pleural effusion.  Continue  aspirin, digoxin, spironolactone.  Alcohol use disorder.: Continue CIWA protocol with as needed Ativan.  Denies withdrawal symptoms at home. None noted  Tobacco use use disorder: Continue nicotine patch.  Counseling was done.  DVT prophylaxis: Lovenox SQ  Code Status: full    Code Status Orders  (From admission, onward)         Start     Ordered   03/11/21 2326  Full code  Continuous        03/11/21 2326        Code Status History    Date Active Date Inactive Code Status Order ID Comments User Context   03/11/2021 2153 03/11/2021 2326 Full Code [id number]  Long, Arlyss Repress, MD ED   Advance Care Planning Activity     Family Communication: none today  Disposition Plan:    Status is: Inpatient  Patient is inpatient admission for IV diuresis, work-up of congestive heart failure, cardiac catheterization without acute cad,   Dispo: The patient is from: Home  Anticipated d/c is to: Home  Patient currently is not medically stable to d/c.              Difficult to place patient; n/a  Consults called: None Admission status: Inpatient   Consultants:   cardiology  Procedures:  DG Chest 2 View  Result Date: 03/11/2021 CLINICAL DATA:  Shortness of breath for 2-3 days.  History of CHF. EXAM: CHEST - 2 VIEW COMPARISON:  Radiograph earlier today FINDINGS: Cardiomegaly. Small bilateral pleural effusions are similar. Ovoid density in the left mid lung likely represents fluid in the fissures on the lateral view. Developing peribronchial thickening. No pneumothorax. No acute osseous abnormalities are seen. IMPRESSION: 1. Developing peribronchial thickening,  suspicious for pulmonary edema. 2. Cardiomegaly and small bilateral pleural effusions, unchanged. Ovoid density in the left mid lung likely represents fluid in the fissure. Electronically Signed   By: Narda RutherfordMelanie  Sanford M.D.   On: 03/11/2021 20:29   CT ANGIO CHEST PE W OR WO CONTRAST  Result Date:  03/12/2021 CLINICAL DATA:  Short of breath for 2 days, positive D dimer EXAM: CT ANGIOGRAPHY CHEST WITH CONTRAST TECHNIQUE: Multidetector CT imaging of the chest was performed using the standard protocol during bolus administration of intravenous contrast. Multiplanar CT image reconstructions and MIPs were obtained to evaluate the vascular anatomy. CONTRAST:  75mL OMNIPAQUE IOHEXOL 350 MG/ML SOLN COMPARISON:  03/11/2021 FINDINGS: Cardiovascular: This is a technically adequate evaluation of the pulmonary vasculature. No filling defects or pulmonary emboli. Heart is markedly enlarged, with severe left ventricular dilatation. No pericardial effusion. 4.1 cm ascending thoracic aortic aneurysm identified. No evidence of dissection. Mediastinum/Nodes: No pathologic adenopathy. Thyroid, trachea, and esophagus are unremarkable. Lungs/Pleura: There is a small right pleural effusion. There is dense right lower lobe atelectasis. Subsegmental atelectasis is seen within the lingula and left lower lobe. A small rounded area of ground-glass airspace disease is seen at the right apex, measuring up to 2.5 cm in diameter, favor focal inflammation or infection. No pneumothorax. Central airways are patent. Upper Abdomen: No acute abnormality. Musculoskeletal: No acute displaced fracture. Reconstructed images demonstrate no additional findings. Review of the MIP images confirms the above findings. IMPRESSION: 1. No evidence of pulmonary embolus. 2. Marked cardiomegaly, with pronounced left ventricular dilatation. 3. Small right pleural effusion. 4. Rounded ground-glass consolidation within the right upper lobe, favor inflammation or infection. 5. 4.1 cm ascending thoracic aortic aneurysm. Recommend annual imaging followup by CTA or MRA. This recommendation follows 2010 ACCF/AHA/AATS/ACR/ASA/SCA/SCAI/SIR/STS/SVM Guidelines for the Diagnosis and Management of Patients with Thoracic Aortic Disease. Circulation. 2010; 121: W098-J191: [id number]. Aortic  aneurysm NOS (ICD10-I71.9) Electronically Signed   By: Sharlet SalinaMichael  Brown M.D.   On: 03/12/2021 02:52   CARDIAC CATHETERIZATION  Result Date: 03/13/2021 Findings: Ao = 86/68 (77) LV = 92/25 RA =  4 RV = 36/10 PA = 36/17 (25) PCW = 20 Fick cardiac output/index = 5.4/3.0 PVR = 1.0 WU SVR = 1088 Ao sat = 98% PA sat = 74%, 78% High SVC sat = 83% Assessment: 1. Normal coronary arteries 2. 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 Elevated filling pressures 4. Cardiac output higher than I expected. Suspect peripheral shunting in setting of probable cirrhosis Plan/Discussion: Continue diuresis. Titrate GDMT. Consider cMRI. Check RUQ u/s to evaluate for cirrhosis. Arvilla Meresaniel Bensimhon, MD 6:15 PM   DG Chest Port 1 View  Result Date: 03/11/2021 CLINICAL DATA:  Shortness of breath with leg swelling EXAM: PORTABLE CHEST 1 VIEW COMPARISON:  None. FINDINGS: Small bilateral pleural effusions with basilar airspace disease. Mild cardiomegaly. Ovoid airspace disease in the left mid lung. No pneumothorax. IMPRESSION: Small bilateral pleural effusions with basilar airspace disease, atelectasis versus pneumonia. Mild cardiomegaly. Electronically Signed   By: Jasmine PangKim  Fujinaga M.D.   On: 03/11/2021 00:17   VAS US LOWER EXTREMITY VENOUS (DVT)  Result Date: 03/12/2021  Lower Venous DVT Study Indications: Swelling, and SOB.  Comparison Study: No prior study on file Performing Technologist: Sherren Kernsandace Kanady RVS  Examination Guidelines: A complete evaluation includes B-mode imaging, spectral Doppler, color Doppler, and power Doppler as needed of all accessible portions of each vessel. Bilateral testing is considered an integral part of a complete examination. Limited examinations for reoccurring indications may be performed as noted. The reflux  portion of the exam is performed with the patient in reverse Trendelenburg.  +---------+---------------+---------+-----------+----------+-------------------+ RIGHT     CompressibilityPhasicitySpontaneityPropertiesThrombus Aging      +---------+---------------+---------+-----------+----------+-------------------+ CFV      Full                                         pulsatile waveforms +---------+---------------+---------+-----------+----------+-------------------+ SFJ      Full                                                             +---------+---------------+---------+-----------+----------+-------------------+ FV Prox  Full                                                             +---------+---------------+---------+-----------+----------+-------------------+ FV Mid   Full                                                             +---------+---------------+---------+-----------+----------+-------------------+ FV DistalFull                                                             +---------+---------------+---------+-----------+----------+-------------------+ PFV      Full                                                             +---------+---------------+---------+-----------+----------+-------------------+ POP      Full                                         pulsatile waveforms +---------+---------------+---------+-----------+----------+-------------------+ PTV      Full                                                             +---------+---------------+---------+-----------+----------+-------------------+ PERO     Full                                                             +---------+---------------+---------+-----------+----------+-------------------+   +---------+---------------+---------+-----------+----------+-------------------+ LEFT  CompressibilityPhasicitySpontaneityPropertiesThrombus Aging      +---------+---------------+---------+-----------+----------+-------------------+ CFV      Full                                         pulsatile waveforms  +---------+---------------+---------+-----------+----------+-------------------+ SFJ      Full                                                             +---------+---------------+---------+-----------+----------+-------------------+ FV Prox  Full                                                             +---------+---------------+---------+-----------+----------+-------------------+ FV Mid   Full                                                             +---------+---------------+---------+-----------+----------+-------------------+ FV DistalFull                                                             +---------+---------------+---------+-----------+----------+-------------------+ PFV      Full                                                             +---------+---------------+---------+-----------+----------+-------------------+ POP      Full                                         pulsatile waveforms +---------+---------------+---------+-----------+----------+-------------------+ PTV      Full                                                             +---------+---------------+---------+-----------+----------+-------------------+ PERO     Full                                                             +---------+---------------+---------+-----------+----------+-------------------+     Summary: RIGHT: - There is no evidence of deep vein thrombosis in the lower extremity.  LEFT: - There is no evidence  of deep vein thrombosis in the lower extremity.  - A cystic structure is found in the popliteal fossa.  *See table(s) above for measurements and observations. Electronically signed by Lemar Livings MD on 03/12/2021 at 5:52:53 PM.    Final      Antimicrobials:   none    Subjective: Continues to diurese, mildly sob with walking to bathroom  Objective: Vitals:   03/14/21 0529 03/14/21 0530 03/14/21 0902 03/14/21 1123  BP:  101/73 93/71  94/71  Pulse:  98 (!) 104 100  Resp: 20 20 20 20   Temp:   97.8 F (36.6 C) 98.3 F (36.8 C)  TempSrc:   Oral Oral  SpO2:   95% 95%  Weight:        Intake/Output Summary (Last 24 hours) at 03/14/2021 1220 Last data filed at 03/14/2021 0420 Gross per 24 hour  Intake 80 ml  Output 350 ml  Net -270 ml   Filed Weights   03/13/21 0318 03/13/21 0550 03/14/21 0415  Weight: 79.3 kg 68.9 kg 67 kg    Examination:  General exam: Appears calm and comfortable  Respiratory system: good inspiratory effort, mild rales Cardiovascular system: S1 & S2 heard, RRR. No JVD, murmurs, rubs, gallops or clicks. No pedal edema. Gastrointestinal system: Abdomen is nondistended, soft and nontender. No organomegaly or masses felt. Normal bowel sounds heard. Central nervous system: Alert and oriented. No focal neurological deficits. Extremities: WWP, trace edema-improved per pt Skin: No rashes, lesions or ulcers Psychiatry: Judgement and insight appear normal. Mood & affect appropriate.     Data Reviewed: I have personally reviewed following labs and imaging studies  CBC: Recent Labs  Lab 03/10/21 2253 03/11/21 2008 03/12/21 0240 03/13/21 0144 03/13/21 1750 03/13/21 1752 03/13/21 1756 03/13/21 1800  WBC 8.9 7.2 6.5 7.4  --   --   --   --   HGB 14.5 15.4* 14.9 14.9 11.9* 16.0* 16.3* 13.9  HCT 41.5 44.4 42.8 42.8 35.0* 47.0* 48.0* 41.0  MCV 98.8 100.0 97.5 97.5  --   --   --   --   PLT 274 308 314 300  --   --   --   --    Basic Metabolic Panel: Recent Labs  Lab 03/10/21 2253 03/11/21 2008 03/12/21 0240 03/13/21 0144 03/13/21 1750 03/13/21 1752 03/13/21 1756 03/13/21 1800 03/14/21 0120  NA 128* 136 136 134* 149* 138 136 142 135  K 4.1 4.0 3.9 3.7 3.0* 4.5 4.7 3.8 4.7  CL 98 101 99 99  --   --   --   --  102  CO2 19* 25 26 25   --   --   --   --  23  GLUCOSE 94 69* 111* 121*  --   --   --   --  122*  BUN 14 13 14 19   --   --   --   --  19  CREATININE 0.83 0.88 0.88 0.95  --   --    --   --  0.93  CALCIUM 8.7* 8.8* 8.8* 8.9  --   --   --   --  9.1  MG  --   --  1.8 1.8  --   --   --   --  2.1  PHOS  --   --   --  5.0*  --   --   --   --  5.3*   GFR: Estimated Creatinine Clearance: 71.2 mL/min (by C-G formula based  on SCr of 0.93 mg/dL). Liver Function Tests: Recent Labs  Lab 03/10/21 2253 03/13/21 0144 03/14/21 0120  AST 48* 32 30  ALT 74* 55* 45*  ALKPHOS 67 71 77  BILITOT 0.5 1.3* 1.2  PROT 5.6* 5.4* 5.5*  ALBUMIN 3.8 3.1* 3.2*   No results for input(s): LIPASE, AMYLASE in the last 168 hours. No results for input(s): AMMONIA in the last 168 hours. Coagulation Profile: No results for input(s): INR, PROTIME in the last 168 hours. Cardiac Enzymes: No results for input(s): CKTOTAL, CKMB, CKMBINDEX, TROPONINI in the last 168 hours. BNP (last 3 results) No results for input(s): PROBNP in the last 8760 hours. HbA1C: No results for input(s): HGBA1C in the last 72 hours. CBG: No results for input(s): GLUCAP in the last 168 hours. Lipid Profile: Recent Labs    03/13/21 0144  CHOL 157  HDL 68  LDLCALC 80  TRIG 46  CHOLHDL 2.3   Thyroid Function Tests: Recent Labs    03/12/21 1607  TSH 3.588   Anemia Panel: No results for input(s): VITAMINB12, FOLATE, FERRITIN, TIBC, IRON, RETICCTPCT in the last 72 hours. Sepsis Labs: No results for input(s): PROCALCITON, LATICACIDVEN in the last 168 hours.  Recent Results (from the past 240 hour(s))  Resp Panel by RT-PCR (Flu A&B, Covid) Nasopharyngeal Swab     Status: None   Collection Time: 03/11/21 11:02 PM   Specimen: Nasopharyngeal Swab; Nasopharyngeal(NP) swabs in vial transport medium  Result Value Ref Range Status   SARS Coronavirus 2 by RT PCR NEGATIVE NEGATIVE Final    Comment: (NOTE) SARS-CoV-2 target nucleic acids are NOT DETECTED.  The SARS-CoV-2 RNA is generally detectable in upper respiratory specimens during the acute phase of infection. The lowest concentration of SARS-CoV-2 viral copies  this assay can detect is 138 copies/mL. A negative result does not preclude SARS-Cov-2 infection and should not be used as the sole basis for treatment or other patient management decisions. A negative result may occur with  improper specimen collection/handling, submission of specimen other than nasopharyngeal swab, presence of viral mutation(s) within the areas targeted by this assay, and inadequate number of viral copies(<138 copies/mL). A negative result must be combined with clinical observations, patient history, and epidemiological information. The expected result is Negative.  Fact Sheet for Patients:  BloggerCourse.com  Fact Sheet for Healthcare Providers:  SeriousBroker.it  This test is no t yet approved or cleared by the [place] FDA and  has been authorized for detection and/or diagnosis of SARS-CoV-2 by FDA under an Emergency Use Authorization (EUA). This EUA will remain  in effect (meaning this test can be used) for the duration of the COVID-19 declaration under Section 564(b)(1) of the Act, 21 U.S.C.section 360bbb-3(b)(1), unless the authorization is terminated  or revoked sooner.       Influenza A by PCR NEGATIVE NEGATIVE Final   Influenza B by PCR NEGATIVE NEGATIVE Final    Comment: (NOTE) The Xpert Xpress SARS-CoV-2/FLU/RSV plus assay is intended as an aid in the diagnosis of influenza from Nasopharyngeal swab specimens and should not be used as a sole basis for treatment. Nasal washings and aspirates are unacceptable for Xpert Xpress SARS-CoV-2/FLU/RSV testing.  Fact Sheet for Patients: BloggerCourse.com  Fact Sheet for Healthcare Providers: SeriousBroker.it  This test is not yet approved or cleared by the [place] FDA and has been authorized for detection and/or diagnosis of SARS-CoV-2 by FDA under an Emergency Use Authorization (EUA). This EUA  will remain in effect (meaning this test can be  used) for the duration of the COVID-19 declaration under Section 564(b)(1) of the Act, 21 U.S.C. section 360bbb-3(b)(1), unless the authorization is terminated or revoked.  Performed at [facility], [address] [place], [place] [address]          Radiology Studies: CARDIAC CATHETERIZATION  Result Date: 03/13/2021 Findings: Ao = 86/68 (77) LV = 92/25 RA =  4 RV = 36/10 PA = 36/17 (25) PCW = 20 Fick cardiac output/index = 5.4/3.0 PVR = 1.0 WU SVR = 1088 Ao sat = 98% PA sat = 74%, 78% High SVC sat = 83% Assessment: 1. Normal coronary arteries 2. 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 Elevated filling pressures 4. Cardiac output higher than I expected. Suspect peripheral shunting in setting of probable cirrhosis Plan/Discussion: Continue diuresis. Titrate GDMT. Consider cMRI. Check RUQ u/s to evaluate for cirrhosis. Arvilla Meres, MD 6:15 PM   VAS [place] LOWER EXTREMITY VENOUS (DVT)  Result Date: 03/12/2021  Lower Venous DVT Study Indications: Swelling, and SOB.  Comparison Study: No prior study on file Performing Technologist: Sherren Kerns RVS  Examination Guidelines: A complete evaluation includes B-mode imaging, spectral Doppler, color Doppler, and power Doppler as needed of all accessible portions of each vessel. Bilateral testing is considered an integral part of a complete examination. Limited examinations for reoccurring indications may be performed as noted. The reflux portion of the exam is performed with the patient in reverse Trendelenburg.  +---------+---------------+---------+-----------+----------+-------------------+ RIGHT    CompressibilityPhasicitySpontaneityPropertiesThrombus Aging      +---------+---------------+---------+-----------+----------+-------------------+ CFV      Full                                         pulsatile waveforms +---------+---------------+---------+-----------+----------+-------------------+ SFJ       Full                                                             +---------+---------------+---------+-----------+----------+-------------------+ FV Prox  Full                                                             +---------+---------------+---------+-----------+----------+-------------------+ FV Mid   Full                                                             +---------+---------------+---------+-----------+----------+-------------------+ FV DistalFull                                                             +---------+---------------+---------+-----------+----------+-------------------+ PFV      Full                                                             +---------+---------------+---------+-----------+----------+-------------------+  POP      Full                                         pulsatile waveforms +---------+---------------+---------+-----------+----------+-------------------+ PTV      Full                                                             +---------+---------------+---------+-----------+----------+-------------------+ PERO     Full                                                             +---------+---------------+---------+-----------+----------+-------------------+   +---------+---------------+---------+-----------+----------+-------------------+ LEFT     CompressibilityPhasicitySpontaneityPropertiesThrombus Aging      +---------+---------------+---------+-----------+----------+-------------------+ CFV      Full                                         pulsatile waveforms +---------+---------------+---------+-----------+----------+-------------------+ SFJ      Full                                                             +---------+---------------+---------+-----------+----------+-------------------+ FV Prox  Full                                                              +---------+---------------+---------+-----------+----------+-------------------+ FV Mid   Full                                                             +---------+---------------+---------+-----------+----------+-------------------+ FV DistalFull                                                             +---------+---------------+---------+-----------+----------+-------------------+ PFV      Full                                                             +---------+---------------+---------+-----------+----------+-------------------+ POP      Full  pulsatile waveforms +---------+---------------+---------+-----------+----------+-------------------+ PTV      Full                                                             +---------+---------------+---------+-----------+----------+-------------------+ PERO     Full                                                             +---------+---------------+---------+-----------+----------+-------------------+     Summary: RIGHT: - There is no evidence of deep vein thrombosis in the lower extremity.  LEFT: - There is no evidence of deep vein thrombosis in the lower extremity.  - A cystic structure is found in the popliteal fossa.  *See table(s) above for measurements and observations. Electronically signed by Lemar Livings MD on 03/12/2021 at 5:52:53 PM.    Final         Scheduled Meds: . digoxin  0.125 mg Oral Daily  . enoxaparin (LOVENOX) injection  40 mg Subcutaneous Q24H  . furosemide  40 mg Intravenous Q12H  . LORazepam  0-4 mg Intravenous Q12H   Or  . LORazepam  0-4 mg Oral Q12H  . nicotine  21 mg Transdermal Daily  . potassium chloride  20 mEq Oral Daily  . sodium chloride flush  3 mL Intravenous Q12H  . spironolactone  12.5 mg Oral Daily  . thiamine  100 mg Oral Daily   Or  . thiamine  100 mg Intravenous Daily   Continuous Infusions: . sodium chloride        LOS: 2 days    Time spent: 35 min    Burke Keels, MD [facility]  If 7PM-7AM, please contact night-coverage  03/14/2021, 12:20 PM

## 2021-03-14 NOTE — Progress Notes (Signed)
Advanced Heart Failure Team Rounding Note   Primary Physician: none Primary Cardiologist:  none  Reason for Consultation: acute systolic biventricular CHF  HPI:    Denies CP or SOB. No DTs.   Cath 3/18 EF 15% No CAD. Elevated filling pressures. High cardiac output.   Diuresing with IV lasix. Weight down 5 pounds. No I/Os recorded.  BP remains soft in 90s.   Denies SOB, orthopnea or PND.    Objective:    Vital Signs:   Temp:  [97.6 F (36.4 C)-98.3 F (36.8 C)] 98.3 F (36.8 C) (03/19 1123) Pulse Rate:  [97-106] 100 (03/19 1123) Resp:  [11-22] 20 (03/19 1123) BP: (90-132)/(67-76) 94/71 (03/19 1123) SpO2:  [90 %-96 %] 95 % (03/19 1123) Weight:  [67 kg] 67 kg (03/19 0415) Last BM Date: 03/10/21  Weight change: Filed Weights   03/13/21 0318 03/13/21 0550 03/14/21 0415  Weight: 79.3 kg 68.9 kg 67 kg    Intake/Output:   Intake/Output Summary (Last 24 hours) at 03/14/2021 1303 Last data filed at 03/14/2021 0420 Gross per 24 hour  Intake 80 ml  Output 350 ml  Net -270 ml      Physical Exam    General:  Sitting up in bed. No resp difficulty HEENT: normal Neck: supple. JVP 7-8. Carotids 2+ bilat; no bruits. No lymphadenopathy or thryomegaly appreciated. Cor: PMI nondisplaced. Regular tachy. No rubs, gallops or murmurs. Lungs: clear Abdomen: soft, nontender, nondistended. No hepatosplenomegaly. No bruits or masses. Good bowel sounds. Extremities: no cyanosis, clubbing, rash, edema Neuro: alert & orientedx3, cranial nerves grossly intact. moves all 4 extremities w/o difficulty. Affect pleasant  Telemetry   Sinus 100-110 Personally reviewed   Labs   Basic Metabolic Panel: Recent Labs  Lab 03/10/21 2253 03/11/21 2008 03/12/21 0240 03/13/21 0144 03/13/21 1750 03/13/21 1752 03/13/21 1756 03/13/21 1800 03/14/21 0120  NA 128* 136 136 134* 149* 138 136 142 135  K 4.1 4.0 3.9 3.7 3.0* 4.5 4.7 3.8 4.7  CL 98 101 99 99  --   --   --   --  102  CO2 19*  25 26 25   --   --   --   --  23  GLUCOSE 94 69* 111* 121*  --   --   --   --  122*  BUN 14 13 14 19   --   --   --   --  19  CREATININE 0.83 0.88 0.88 0.95  --   --   --   --  0.93  CALCIUM 8.7* 8.8* 8.8* 8.9  --   --   --   --  9.1  MG  --   --  1.8 1.8  --   --   --   --  2.1  PHOS  --   --   --  5.0*  --   --   --   --  5.3*    Liver Function Tests: Recent Labs  Lab 03/10/21 2253 03/13/21 0144 03/14/21 0120  AST 48* 32 30  ALT 74* 55* 45*  ALKPHOS 67 71 77  BILITOT 0.5 1.3* 1.2  PROT 5.6* 5.4* 5.5*  ALBUMIN 3.8 3.1* 3.2*   No results for input(s): LIPASE, AMYLASE in the last 168 hours. No results for input(s): AMMONIA in the last 168 hours.  CBC: Recent Labs  Lab 03/10/21 2253 03/11/21 2008 03/12/21 0240 03/13/21 0144 03/13/21 1750 03/13/21 1752 03/13/21 1756 03/13/21 1800  WBC 8.9 7.2 6.5 7.4  --   --   --   --  HGB 14.5 15.4* 14.9 14.9 11.9* 16.0* 16.3* 13.9  HCT 41.5 44.4 42.8 42.8 35.0* 47.0* 48.0* 41.0  MCV 98.8 100.0 97.5 97.5  --   --   --   --   PLT 274 308 314 300  --   --   --   --     Cardiac Enzymes: No results for input(s): CKTOTAL, CKMB, CKMBINDEX, TROPONINI in the last 168 hours.  BNP: BNP (last 3 results) Recent Labs    03/10/21 2253 03/13/21 0953  BNP 2,348.9* 2,254.2*    ProBNP (last 3 results) No results for input(s): PROBNP in the last 8760 hours.   CBG: No results for input(s): GLUCAP in the last 168 hours.  Coagulation Studies: No results for input(s): LABPROT, INR in the last 72 hours.     Assessment/Plan   1. Acute systolic CHF, Biventricular CHF - ECHO 03/12/21 EF 15% severe RV dysfunction - Cath 03/13/21 EF 15% No CAD. Elevated filling pressures. High cardiac output.  - Suspect ETOH CM. TSH normal, UDS negative - Continue diuresis.  - Continue digoxin. Add low dose-spiro - BP too low for ARB/ARNI - avoid bb for now with severely low EF and decompensation - Plan c/MRI on Monday  2. High cardiac output on cath -  check RUQ u/s for cirrhosis - may need midodrine  3. Alcohol Use disorder: - on CIWA discussed cessation  - per primary  4. Tobacco Use disorder: -on nicotine patch, discussed cessation  5. Snoring - will need outpatient sleep study  Length of Stay: 2  Arvilla Meres, MD  03/14/2021, 1:03 PM  Advanced Heart Failure Team Pager [phone] (M-F; 7a - 4p)  Please contact [facility] for night-coverage after hours (4p -7a ) and weekends on [email or web address]

## 2021-03-14 NOTE — Progress Notes (Signed)
Pt has her 2 Covid 19 and her booster.

## 2021-03-15 ENCOUNTER — Inpatient Hospital Stay (HOSPITAL_COMMUNITY): Payer: No Typology Code available for payment source

## 2021-03-15 DIAGNOSIS — I5021 Acute systolic (congestive) heart failure: Secondary | ICD-10-CM | POA: Diagnosis not present

## 2021-03-15 DIAGNOSIS — K7460 Unspecified cirrhosis of liver: Secondary | ICD-10-CM

## 2021-03-15 DIAGNOSIS — K7031 Alcoholic cirrhosis of liver with ascites: Secondary | ICD-10-CM

## 2021-03-15 DIAGNOSIS — J810 Acute pulmonary edema: Secondary | ICD-10-CM | POA: Diagnosis not present

## 2021-03-15 DIAGNOSIS — F17200 Nicotine dependence, unspecified, uncomplicated: Secondary | ICD-10-CM | POA: Diagnosis not present

## 2021-03-15 DIAGNOSIS — F1010 Alcohol abuse, uncomplicated: Secondary | ICD-10-CM | POA: Diagnosis not present

## 2021-03-15 LAB — BASIC METABOLIC PANEL
Anion gap: 11 (ref 5–15)
BUN: 21 mg/dL — ABNORMAL HIGH (ref 6–20)
CO2: 25 mmol/L (ref 22–32)
Calcium: 9.1 mg/dL (ref 8.9–10.3)
Chloride: 98 mmol/L (ref 98–111)
Creatinine, Ser: 0.88 mg/dL (ref 0.44–1.00)
GFR, Estimated: >60 mL/min (ref >60)
Glucose, Bld: 94 mg/dL (ref 70–99)
Potassium: 4.6 mmol/L (ref 3.5–5.1)
Sodium: 134 mmol/L — ABNORMAL LOW (ref 135–145)

## 2021-03-15 MED ORDER — SPIRONOLACTONE 12.5 MG HALF TABLET: 12.5000 mg | ORAL_TABLET | Freq: Every day | ORAL | Status: DC

## 2021-03-15 MED ORDER — MIDODRINE HCL 5 MG PO TABS
2.5000 mg | ORAL_TABLET | Freq: Three times a day (TID) | ORAL | Status: DC
  Administered 2021-03-15 – 2021-03-16 (×2): 2.5 mg via ORAL
  Filled 2021-03-15 (×2): qty 1

## 2021-03-15 NOTE — Progress Notes (Signed)
PROGRESS NOTE    Pamela Bryan  ZOX:[id number] DOB: January 18, 1971 DOA: 03/11/2021 PCP: Patient, No Pcp Per   Brief Narrative:  Pamela Bryan a 50 y.o.femalewith medical history significant foralcohol and tobacco use who presented to the ED for progressive shortness of breath lower extremity swelling. Her symptoms started since January. Shereportedbilateral leg swelling with 10-15 pound weight gain. Patient continues to smoke cigarettes and drinks wine at nighttime,denies any withdrawal symptoms. In the ED,on this admission,patient was noted to have a borderline low blood pressure with tachycardia but pulse ox was normal .Twoview chest x-ray shows developing peribronchial thickening suspicious for pulmonary edema, cardiomegaly and small bilateral pleural effusions which are unchanged. Patient was given IV Lasix 20 mg once and placed on CIWA protocol with Ativan as needed.   Patient was seen by cardiology for significantly low systolic function and decompensated congestive heart failure.. At this time,cardiology is waiting for cardiac cath   Assessment & Plan:   Principal Problem:   Pulmonary edema Active Problems:   CHF (congestive heart failure) (HCC)   Acute systolic heart failure (HCC)    Acute onset systolic CHF with pulmonary edema: Patient presented with progressive weight gain,edema,dyspnea on exertion,orthopnea and typical features of congestive heart failure. Still complains of dyspnea on exertion after IV diuresis. 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 (no acute disease noted) Mild to moderate MR AI. Continue IV diuresis with Lasix twice a day.Continuestrict intake and output charting. CTA was done which showed no evidence of pulmonary embolism but pleural effusion.Continue aspirin,  digoxin, spironolactone. Cardiology evaluating for cardiac MRI on Monday  Alcohol use disorder.: Continue CIWA protocol with as needed Ativan. Denies withdrawal symptoms at home. 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 Did report patient's father was a recovering alcoholic  Tobacco use use disorder: Continue nicotine patch.Counseling was done  DVT prophylaxis: Lovenox SQ  Code Status: FULL    Code Status Orders  (From admission, onward)         Start     Ordered   03/11/21 2326  Full code  Continuous        03/11/21 2326        Code Status History    Date Active Date Inactive Code Status Order ID Comments User Context   03/11/2021 2153 03/11/2021 2326 Full Code [id number]  Long, Arlyss Repress, MD ED   Advance Care Planning Activity     Family Communication: Mother at bedside Disposition Plan:    Status NF:[id number]  Patientisinpatient admission for IV diuresis, work-up of congestive heart failure, cardiac catheterization without acute cad,  Dispo: The patient is from: Home Anticipated d/c is QI:ONGE Patient currently is not medically stable to d/c. Difficult to place patient; n/a  Consults called: None Admission status: Inpatient   Consultants:   Cardiology  Procedures:  DG Chest 2 View  Result Date: 03/11/2021 CLINICAL DATA:  Shortness of breath for 2-3 days.  History of CHF. EXAM: CHEST - 2 VIEW COMPARISON:  Radiograph earlier today FINDINGS: Cardiomegaly. Small bilateral pleural effusions are similar. Ovoid density in the left mid lung likely represents fluid in the fissures on the lateral view. Developing peribronchial thickening. No pneumothorax. No acute osseous abnormalities are seen. IMPRESSION: 1. Developing peribronchial thickening, suspicious for pulmonary edema. 2. Cardiomegaly and small bilateral pleural  effusions, unchanged. Ovoid density in the left  mid lung likely represents fluid in the fissure. Electronically Signed   By: Narda Rutherford M.D.   On: 03/11/2021 20:29   CT ANGIO CHEST PE W OR WO CONTRAST  Result Date: 03/12/2021 CLINICAL DATA:  Short of breath for 2 days, positive D dimer EXAM: CT ANGIOGRAPHY CHEST WITH CONTRAST TECHNIQUE: Multidetector CT imaging of the chest was performed using the standard protocol during bolus administration of intravenous contrast. Multiplanar CT image reconstructions and MIPs were obtained to evaluate the vascular anatomy. CONTRAST:  62mL OMNIPAQUE IOHEXOL 350 MG/ML SOLN COMPARISON:  03/11/2021 FINDINGS: Cardiovascular: This is a technically adequate evaluation of the pulmonary vasculature. No filling defects or pulmonary emboli. Heart is markedly enlarged, with severe left ventricular dilatation. No pericardial effusion. 4.1 cm ascending thoracic aortic aneurysm identified. No evidence of dissection. Mediastinum/Nodes: No pathologic adenopathy. Thyroid, trachea, and esophagus are unremarkable. Lungs/Pleura: There is a small right pleural effusion. There is dense right lower lobe atelectasis. Subsegmental atelectasis is seen within the lingula and left lower lobe. A small rounded area of ground-glass airspace disease is seen at the right apex, measuring up to 2.5 cm in diameter, favor focal inflammation or infection. No pneumothorax. Central airways are patent. Upper Abdomen: No acute abnormality. Musculoskeletal: No acute displaced fracture. Reconstructed images demonstrate no additional findings. Review of the MIP images confirms the above findings. IMPRESSION: 1. No evidence of pulmonary embolus. 2. Marked cardiomegaly, with pronounced left ventricular dilatation. 3. Small right pleural effusion. 4. Rounded ground-glass consolidation within the right upper lobe, favor inflammation or infection. 5. 4.1 cm ascending thoracic aortic aneurysm. Recommend annual imaging  followup by CTA or MRA. This recommendation follows 2010 ACCF/AHA/AATS/ACR/ASA/SCA/SCAI/SIR/STS/SVM Guidelines for the Diagnosis and Management of Patients with Thoracic Aortic Disease. Circulation. 2010; 121: C166-A630. Aortic aneurysm NOS (ICD10-I71.9) Electronically Signed   By: Sharlet Salina M.D.   On: 03/12/2021 02:52   CARDIAC CATHETERIZATION  Result Date: 03/13/2021 Findings: Ao = 86/68 (77) LV = 92/25 RA =  4 RV = 36/10 PA = 36/17 (25) PCW = 20 Fick cardiac output/index = 5.4/3.0 PVR = 1.0 WU SVR = 1088 Ao sat = 98% PA sat = 74%, 78% High SVC sat = 83% Assessment: 1. Normal coronary arteries 2. 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 Elevated filling pressures 4. Cardiac output higher than I expected. Suspect peripheral shunting in setting of probable cirrhosis Plan/Discussion: Continue diuresis. Titrate GDMT. Consider cMRI. Check RUQ u/s to evaluate for cirrhosis. Arvilla Meres, MD 6:15 PM   DG CHEST PORT 1 VIEW  Result Date: 03/15/2021 CLINICAL DATA:  Edema. Additional history provided: Shortness of breath/CHF, evaluate peribronchial thickening, pulmonary edema, bilateral pleural effusions, ovoid density. EXAM: PORTABLE CHEST 1 VIEW COMPARISON:  CT angiogram chest 03/12/2021. Prior chest radiographs 03/11/2021 and earlier. FINDINGS: Please note a small portion of the left lateral costophrenic angle is excluded from the field of view. Unchanged cardiomegaly and central pulmonary vascular congestion. Persistent small right pleural effusion. Superimposed ill-defined right basilar opacity likely reflecting subsegmental atelectasis. The right upper lobe ground-glass opacity demonstrated on the prior chest CT of 03/12/2021 is not appreciable radiographically. A skin fold projects over the right lung apex. Redemonstrated linear atelectasis within the left mid lung. No sizable left pleural effusion. No evidence of pneumothorax. IMPRESSION: Unchanged cardiomegaly and central pulmonary vascular congestion. Persistent  small right pleural effusion. Associated right basilar subsegmental atelectasis, improved. The small region of right upper lobe ground-glass opacity demonstrated on the prior chest CT of 03/12/2021 is not appreciable radiographically. Persistent although improved linear  atelectasis within the left mid lung. Electronically Signed   By: Jackey Loge DO   On: 03/15/2021 09:23   DG Chest Port 1 View  Result Date: 03/11/2021 CLINICAL DATA:  Shortness of breath with leg swelling EXAM: PORTABLE CHEST 1 VIEW COMPARISON:  None. FINDINGS: Small bilateral pleural effusions with basilar airspace disease. Mild cardiomegaly. Ovoid airspace disease in the left mid lung. No pneumothorax. IMPRESSION: Small bilateral pleural effusions with basilar airspace disease, atelectasis versus pneumonia. Mild cardiomegaly. Electronically Signed   By: Jasmine Pang M.D.   On: 03/11/2021 00:17   VAS [place] LOWER EXTREMITY VENOUS (DVT)  Result Date: 03/12/2021  Lower Venous DVT Study Indications: Swelling, and SOB.  Comparison Study: No prior study on file Performing Technologist: Sherren Kerns RVS  Examination Guidelines: A complete evaluation includes B-mode imaging, spectral Doppler, color Doppler, and power Doppler as needed of all accessible portions of each vessel. Bilateral testing is considered an integral part of a complete examination. Limited examinations for reoccurring indications may be performed as noted. The reflux portion of the exam is performed with the patient in reverse Trendelenburg.  +---------+---------------+---------+-----------+----------+-------------------+ RIGHT    CompressibilityPhasicitySpontaneityPropertiesThrombus Aging      +---------+---------------+---------+-----------+----------+-------------------+ CFV      Full                                         pulsatile waveforms +---------+---------------+---------+-----------+----------+-------------------+ SFJ      Full                                                              +---------+---------------+---------+-----------+----------+-------------------+ FV Prox  Full                                                             +---------+---------------+---------+-----------+----------+-------------------+ FV Mid   Full                                                             +---------+---------------+---------+-----------+----------+-------------------+ FV DistalFull                                                             +---------+---------------+---------+-----------+----------+-------------------+ PFV      Full                                                             +---------+---------------+---------+-----------+----------+-------------------+ POP      Full  pulsatile waveforms +---------+---------------+---------+-----------+----------+-------------------+ PTV      Full                                                             +---------+---------------+---------+-----------+----------+-------------------+ PERO     Full                                                             +---------+---------------+---------+-----------+----------+-------------------+   +---------+---------------+---------+-----------+----------+-------------------+ LEFT     CompressibilityPhasicitySpontaneityPropertiesThrombus Aging      +---------+---------------+---------+-----------+----------+-------------------+ CFV      Full                                         pulsatile waveforms +---------+---------------+---------+-----------+----------+-------------------+ SFJ      Full                                                             +---------+---------------+---------+-----------+----------+-------------------+ FV Prox  Full                                                              +---------+---------------+---------+-----------+----------+-------------------+ FV Mid   Full                                                             +---------+---------------+---------+-----------+----------+-------------------+ FV DistalFull                                                             +---------+---------------+---------+-----------+----------+-------------------+ PFV      Full                                                             +---------+---------------+---------+-----------+----------+-------------------+ POP      Full                                         pulsatile waveforms +---------+---------------+---------+-----------+----------+-------------------+ PTV      Full                                                             +---------+---------------+---------+-----------+----------+-------------------+  PERO     Full                                                             +---------+---------------+---------+-----------+----------+-------------------+     Summary: RIGHT: - There is no evidence of deep vein thrombosis in the lower extremity.  LEFT: - There is no evidence of deep vein thrombosis in the lower extremity.  - A cystic structure is found in the popliteal fossa.  *See table(s) above for measurements and observations. Electronically signed by Lemar Livings MD on 03/12/2021 at 5:52:53 PM.    Final    US Abdomen Limited RUQ (LIVER/GB)  Result Date: 03/14/2021 CLINICAL DATA:  Cirrhosis of the liver. EXAM: ULTRASOUND ABDOMEN LIMITED RIGHT UPPER QUADRANT COMPARISON:  CT abdomen pelvis 03/12/2021 FINDINGS: Gallbladder: Surgically absent. Common bile duct: Diameter: 0.3 cm, within normal limits. Liver: No focal lesion identified. Mildly nodular contour and slight coarsening of the echotexture. Within normal limits in parenchymal echogenicity. Portal vein is patent on color Doppler imaging with normal direction of blood flow  towards the liver. Other: Right pleural effusion noted. IMPRESSION: 1. Appearance of the liver in keeping with history of cirrhosis. No focal liver lesion identified. 2.  Incidentally noted right pleural effusion. Electronically Signed   By: Emmaline Kluver M.D.   On: 03/14/2021 16:55     Antimicrobials:   None  Subjective: Still reports being short of breath, although was noted to walk to the bathroom today with minimal shortness of breath  Objective: Vitals:   03/14/21 1700 03/15/21 0547 03/15/21 0830 03/15/21 1224  BP: 99/76 96/70 (!) 87/61 100/66  Pulse: 100 95 87 100  Resp: 20 20 19 20   Temp: 97.7 F (36.5 C) 97.6 F (36.4 C) 98.4 F (36.9 C) 98.2 F (36.8 C)  TempSrc: Oral Oral Oral Oral  SpO2: 95% 100% 94% 95%  Weight:  65.6 kg    Height:  5\' 7"  (1.702 m)      Intake/Output Summary (Last 24 hours) at 03/15/2021 1316 Last data filed at 03/15/2021 0557 Gross per 24 hour  Intake --  Output 250 ml  Net -250 ml   Filed Weights   03/13/21 0550 03/14/21 0415 03/15/21 0547  Weight: 68.9 kg 67 kg 65.6 kg    Examination:  General exam: Appears calm and comfortable  Respiratory system: Good respiratory effort, no accessory muscle use, trace rales at bases Cardiovascular system: S1 & S2 heard, RRR. No JVD, murmurs, rubs, gallops or clicks. No pedal edema. Gastrointestinal system: Abdomen is nondistended, soft and nontender. No organomegaly or masses felt. Normal bowel sounds heard. Central nervous system: Alert and oriented. No focal neurological deficits. Extremities: Warm well perfused, trace edema. Skin: No rashes, lesions or ulcers Psychiatry: Judgement and insight appear normal. Mood & affect appropriate.     Data Reviewed: I have personally reviewed following labs and imaging studies  CBC: Recent Labs  Lab 03/10/21 2253 03/11/21 2008 03/12/21 0240 03/13/21 0144 03/13/21 1750 03/13/21 1752 03/13/21 1756 03/13/21 1800  WBC 8.9 7.2 6.5 7.4  --   --   --    --   HGB 14.5 15.4* 14.9 14.9 11.9* 16.0* 16.3* 13.9  HCT 41.5 44.4 42.8 42.8 35.0* 47.0* 48.0* 41.0  MCV 98.8 100.0 97.5 97.5  --   --   --   --  PLT 274 308 314 300  --   --   --   --    Basic Metabolic Panel: Recent Labs  Lab 03/11/21 2008 03/12/21 0240 03/13/21 0144 03/13/21 1750 03/13/21 1752 03/13/21 1756 03/13/21 1800 03/14/21 0120 03/15/21 0151  NA 136 136 134*   < > 138 136 142 135 134*  K 4.0 3.9 3.7   < > 4.5 4.7 3.8 4.7 4.6  CL 101 99 99  --   --   --   --  102 98  CO2 25 26 25   --   --   --   --  23 25  GLUCOSE 69* 111* 121*  --   --   --   --  122* 94  BUN 13 14 19   --   --   --   --  19 21*  CREATININE 0.88 0.88 0.95  --   --   --   --  0.93 0.88  CALCIUM 8.8* 8.8* 8.9  --   --   --   --  9.1 9.1  MG  --  1.8 1.8  --   --   --   --  2.1  --   PHOS  --   --  5.0*  --   --   --   --  5.3*  --    < > = values in this interval not displayed.   GFR: Estimated Creatinine Clearance: 75.2 mL/min (by C-G formula based on SCr of 0.88 mg/dL). Liver Function Tests: Recent Labs  Lab 03/10/21 2253 03/13/21 0144 03/14/21 0120  AST 48* 32 30  ALT 74* 55* 45*  ALKPHOS 67 71 77  BILITOT 0.5 1.3* 1.2  PROT 5.6* 5.4* 5.5*  ALBUMIN 3.8 3.1* 3.2*   No results for input(s): LIPASE, AMYLASE in the last 168 hours. No results for input(s): AMMONIA in the last 168 hours. Coagulation Profile: No results for input(s): INR, PROTIME in the last 168 hours. Cardiac Enzymes: No results for input(s): CKTOTAL, CKMB, CKMBINDEX, TROPONINI in the last 168 hours. BNP (last 3 results) No results for input(s): PROBNP in the last 8760 hours. HbA1C: No results for input(s): HGBA1C in the last 72 hours. CBG: No results for input(s): GLUCAP in the last 168 hours. Lipid Profile: Recent Labs    03/13/21 0144  CHOL 157  HDL 68  LDLCALC 80  TRIG 46  CHOLHDL 2.3   Thyroid Function Tests: Recent Labs    03/12/21 1607  TSH 3.588   Anemia Panel: No results for input(s):  VITAMINB12, FOLATE, FERRITIN, TIBC, IRON, RETICCTPCT in the last 72 hours. Sepsis Labs: No results for input(s): PROCALCITON, LATICACIDVEN in the last 168 hours.  Recent Results (from the past 240 hour(s))  Resp Panel by RT-PCR (Flu A&B, Covid) Nasopharyngeal Swab     Status: None   Collection Time: 03/11/21 11:02 PM   Specimen: Nasopharyngeal Swab; Nasopharyngeal(NP) swabs in vial transport medium  Result Value Ref Range Status   SARS Coronavirus 2 by RT PCR NEGATIVE NEGATIVE Final    Comment: (NOTE) SARS-CoV-2 target nucleic acids are NOT DETECTED.  The SARS-CoV-2 RNA is generally detectable in upper respiratory specimens during the acute phase of infection. The lowest concentration of SARS-CoV-2 viral copies this assay can detect is 138 copies/mL. A negative result does not preclude SARS-Cov-2 infection and should not be used as the sole basis for treatment or other patient management decisions. A negative result may occur with  improper specimen collection/handling,  submission of specimen other than nasopharyngeal swab, presence of viral mutation(s) within the areas targeted by this assay, and inadequate number of viral copies(<138 copies/mL). A negative result must be combined with clinical observations, patient history, and epidemiological information. The expected result is Negative.  Fact Sheet for Patients:  BloggerCourse.comhttps://www.fda.gov/media/152166/download  Fact Sheet for Healthcare Providers:  SeriousBroker.ithttps://www.fda.gov/media/152162/download  This test is no t yet approved or cleared by the [place] FDA and  has been authorized for detection and/or diagnosis of SARS-CoV-2 by FDA under an Emergency Use Authorization (EUA). This EUA will remain  in effect (meaning this test can be used) for the duration of the COVID-19 declaration under Section 564(b)(1) of the Act, 21 U.S.C.section 360bbb-3(b)(1), unless the authorization is terminated  or revoked sooner.       Influenza A  by PCR NEGATIVE NEGATIVE Final   Influenza B by PCR NEGATIVE NEGATIVE Final    Comment: (NOTE) The Xpert Xpress SARS-CoV-2/FLU/RSV plus assay is intended as an aid in the diagnosis of influenza from Nasopharyngeal swab specimens and should not be used as a sole basis for treatment. Nasal washings and aspirates are unacceptable for Xpert Xpress SARS-CoV-2/FLU/RSV testing.  Fact Sheet for Patients: BloggerCourse.comhttps://www.fda.gov/media/152166/download  Fact Sheet for Healthcare Providers: SeriousBroker.ithttps://www.fda.gov/media/152162/download  This test is not yet approved or cleared by the [place] FDA and has been authorized for detection and/or diagnosis of SARS-CoV-2 by FDA under an Emergency Use Authorization (EUA). This EUA will remain in effect (meaning this test can be used) for the duration of the COVID-19 declaration under Section 564(b)(1) of the Act, 21 U.S.C. section 360bbb-3(b)(1), unless the authorization is terminated or revoked.  Performed at [facility], [address] [place], [place] [address]          Radiology Studies: CARDIAC CATHETERIZATION  Result Date: 03/13/2021 Findings: Ao = 86/68 (77) LV = 92/25 RA =  4 RV = 36/10 PA = 36/17 (25) PCW = 20 Fick cardiac output/index = 5.4/3.0 PVR = 1.0 WU SVR = 1088 Ao sat = 98% PA sat = 74%, 78% High SVC sat = 83% Assessment: 1. Normal coronary arteries 2. 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 Elevated filling pressures 4. Cardiac output higher than I expected. Suspect peripheral shunting in setting of probable cirrhosis Plan/Discussion: Continue diuresis. Titrate GDMT. Consider cMRI. Check RUQ u/s to evaluate for cirrhosis. Arvilla Meresaniel Bensimhon, MD 6:15 PM   DG CHEST PORT 1 VIEW  Result Date: 03/15/2021 CLINICAL DATA:  Edema. Additional history provided: Shortness of breath/CHF, evaluate peribronchial thickening, pulmonary edema, bilateral pleural effusions, ovoid density. EXAM: PORTABLE CHEST 1 VIEW COMPARISON:  CT angiogram chest 03/12/2021.  Prior chest radiographs 03/11/2021 and earlier. FINDINGS: Please note a small portion of the left lateral costophrenic angle is excluded from the field of view. Unchanged cardiomegaly and central pulmonary vascular congestion. Persistent small right pleural effusion. Superimposed ill-defined right basilar opacity likely reflecting subsegmental atelectasis. The right upper lobe ground-glass opacity demonstrated on the prior chest CT of 03/12/2021 is not appreciable radiographically. A skin fold projects over the right lung apex. Redemonstrated linear atelectasis within the left mid lung. No sizable left pleural effusion. No evidence of pneumothorax. IMPRESSION: Unchanged cardiomegaly and central pulmonary vascular congestion. Persistent small right pleural effusion. Associated right basilar subsegmental atelectasis, improved. The small region of right upper lobe ground-glass opacity demonstrated on the prior chest CT of 03/12/2021 is not appreciable radiographically. Persistent although improved linear atelectasis within the left mid lung. Electronically Signed   By: Jackey LogeKyle  Golden  DO   On: 03/15/2021 09:23   US Abdomen Limited RUQ (LIVER/GB)  Result Date: 03/14/2021 CLINICAL DATA:  Cirrhosis of the liver. EXAM: ULTRASOUND ABDOMEN LIMITED RIGHT UPPER QUADRANT COMPARISON:  CT abdomen pelvis 03/12/2021 FINDINGS: Gallbladder: Surgically absent. Common bile duct: Diameter: 0.3 cm, within normal limits. Liver: No focal lesion identified. Mildly nodular contour and slight coarsening of the echotexture. Within normal limits in parenchymal echogenicity. Portal vein is patent on color Doppler imaging with normal direction of blood flow towards the liver. Other: Right pleural effusion noted. IMPRESSION: 1. Appearance of the liver in keeping with history of cirrhosis. No focal liver lesion identified. 2.  Incidentally noted right pleural effusion. Electronically Signed   By: Emmaline Kluver M.D.   On: 03/14/2021 16:55         Scheduled Meds: . digoxin  0.125 mg Oral Daily  . enoxaparin (LOVENOX) injection  40 mg Subcutaneous Q24H  . furosemide  40 mg Intravenous Q12H  . LORazepam  0-4 mg Intravenous Q12H   Or  . LORazepam  0-4 mg Oral Q12H  . nicotine  21 mg Transdermal Daily  . potassium chloride  20 mEq Oral Daily  . sodium chloride flush  3 mL Intravenous Q12H  . spironolactone  12.5 mg Oral Daily  . thiamine  100 mg Oral Daily   Or  . thiamine  100 mg Intravenous Daily   Continuous Infusions: . sodium chloride       LOS: 3 days    Time spent: 35 min    Burke Keels, MD [facility]  If 7PM-7AM, please contact night-coverage  03/15/2021, 1:16 PM

## 2021-03-15 NOTE — Progress Notes (Addendum)
Advanced Heart Failure Team Rounding Note   Primary Physician: none Primary Cardiologist:  none  Reason for Consultation: acute systolic biventricular CHF  HPI:    Denies CP or SOB. No DTs.   Cath 3/18 EF 15% No CAD. Elevated filling pressures. High cardiac output.   Diuresing with IV lasix. Weight down another 3 pounds. No I/Os recorded.  BP remains soft in 90s.   Ab u/s with evidence of cirrhosis but no portal HTN    Objective:    Vital Signs:   Temp:  [97.6 F (36.4 C)-98.4 F (36.9 C)] 98.4 F (36.9 C) (03/20 0830) Pulse Rate:  [87-100] 87 (03/20 0830) Resp:  [19-20] 19 (03/20 0830) BP: (87-99)/(61-76) 87/61 (03/20 0830) SpO2:  [94 %-100 %] 94 % (03/20 0830) Weight:  [65.6 kg] 65.6 kg (03/20 0547) Last BM Date: 03/10/21  Weight change: Filed Weights   03/13/21 0550 03/14/21 0415 03/15/21 0547  Weight: 68.9 kg 67 kg 65.6 kg    Intake/Output:   Intake/Output Summary (Last 24 hours) at 03/15/2021 1117 Last data filed at 03/15/2021 0557 Gross per 24 hour  Intake --  Output 250 ml  Net -250 ml      Physical Exam    General:  Sitting up in bed. No resp difficulty HEENT: normal Neck: supple. no JVD. Carotids 2+ bilat; no bruits. No lymphadenopathy or thryomegaly appreciated. Cor: PMI nondisplaced. Regular rate & rhythm. No rubs, gallops or murmurs. Lungs: clear Abdomen: soft, nontender, nondistended. No hepatosplenomegaly. No bruits or masses. Good bowel sounds. Extremities: no cyanosis, clubbing, rash, edema Neuro: alert & orientedx3, cranial nerves grossly intact. moves all 4 extremities w/o difficulty. Affect pleasant   Telemetry   Sinus 90-100 Personally reviewed   Labs   Basic Metabolic Panel: Recent Labs  Lab 03/11/21 2008 03/12/21 0240 03/13/21 0144 03/13/21 1750 03/13/21 1752 03/13/21 1756 03/13/21 1800 03/14/21 0120 03/15/21 0151  NA 136 136 134*   < > 138 136 142 135 134*  K 4.0 3.9 3.7   < > 4.5 4.7 3.8 4.7 4.6  CL 101 99  99  --   --   --   --  102 98  CO2 25 26 25   --   --   --   --  23 25  GLUCOSE 69* 111* 121*  --   --   --   --  122* 94  BUN 13 14 19   --   --   --   --  19 21*  CREATININE 0.88 0.88 0.95  --   --   --   --  0.93 0.88  CALCIUM 8.8* 8.8* 8.9  --   --   --   --  9.1 9.1  MG  --  1.8 1.8  --   --   --   --  2.1  --   PHOS  --   --  5.0*  --   --   --   --  5.3*  --    < > = values in this interval not displayed.    Liver Function Tests: Recent Labs  Lab 03/10/21 2253 03/13/21 0144 03/14/21 0120  AST 48* 32 30  ALT 74* 55* 45*  ALKPHOS 67 71 77  BILITOT 0.5 1.3* 1.2  PROT 5.6* 5.4* 5.5*  ALBUMIN 3.8 3.1* 3.2*   No results for input(s): LIPASE, AMYLASE in the last 168 hours. No results for input(s): AMMONIA in the last 168 hours.  CBC: Recent  Labs  Lab 03/10/21 2253 03/11/21 2008 03/12/21 0240 03/13/21 0144 03/13/21 1750 03/13/21 1752 03/13/21 1756 03/13/21 1800  WBC 8.9 7.2 6.5 7.4  --   --   --   --   HGB 14.5 15.4* 14.9 14.9 11.9* 16.0* 16.3* 13.9  HCT 41.5 44.4 42.8 42.8 35.0* 47.0* 48.0* 41.0  MCV 98.8 100.0 97.5 97.5  --   --   --   --   PLT 274 308 314 300  --   --   --   --     Cardiac Enzymes: No results for input(s): CKTOTAL, CKMB, CKMBINDEX, TROPONINI in the last 168 hours.  BNP: BNP (last 3 results) Recent Labs    03/10/21 2253 03/13/21 0953  BNP 2,348.9* 2,254.2*    ProBNP (last 3 results) No results for input(s): PROBNP in the last 8760 hours.   CBG: No results for input(s): GLUCAP in the last 168 hours.  Coagulation Studies: No results for input(s): LABPROT, INR in the last 72 hours.     Assessment/Plan   1. Acute systolic CHF, Biventricular CHF - ECHO 03/12/21 EF 15% severe RV dysfunction - Cath 03/13/21 EF 15% No CAD. Elevated filling pressures. High cardiac output.  - Suspect ETOH CM. TSH normal, UDS negative - Volume status much improved. Stop IV lasix.  - Continue digoxin. Add low dose-spiro - BP too low for ARB/ARNI - avoid  bb for now with severely low EF and decompensation - possible SGLT2i - Plan c/MRI tomorrow  2. High cardiac output on cath - RUQ u/s suggestive of cirrhosis - will add midodrine   3. Alcohol Use disorder: - on CIWA discussed cessation  - per primary  4. Tobacco Use disorder: -on nicotine patch, discussed cessation  5. Snoring - will need outpatient sleep study  Length of Stay: 3  Arvilla Meres, MD  03/15/2021, 11:17 AM  Advanced Heart Failure Team Pager [phone] (M-F; 7a - 4p)  Please contact [facility] for night-coverage after hours (4p -7a ) and weekends on [email or web address]

## 2021-03-16 ENCOUNTER — Encounter (HOSPITAL_COMMUNITY): Payer: Self-pay | Admitting: Internal Medicine

## 2021-03-16 ENCOUNTER — Telehealth (HOSPITAL_COMMUNITY): Payer: Self-pay | Admitting: Pharmacy Technician

## 2021-03-16 ENCOUNTER — Telehealth: Payer: Self-pay

## 2021-03-16 ENCOUNTER — Inpatient Hospital Stay (HOSPITAL_COMMUNITY): Payer: No Typology Code available for payment source

## 2021-03-16 DIAGNOSIS — I5021 Acute systolic (congestive) heart failure: Secondary | ICD-10-CM

## 2021-03-16 DIAGNOSIS — J810 Acute pulmonary edema: Secondary | ICD-10-CM | POA: Diagnosis not present

## 2021-03-16 LAB — BASIC METABOLIC PANEL
Anion gap: 8 (ref 5–15)
BUN: 23 mg/dL — ABNORMAL HIGH (ref 6–20)
CO2: 24 mmol/L (ref 22–32)
Calcium: 9.1 mg/dL (ref 8.9–10.3)
Chloride: 101 mmol/L (ref 98–111)
Creatinine, Ser: 0.98 mg/dL (ref 0.44–1.00)
GFR, Estimated: >60 mL/min (ref >60)
Glucose, Bld: 96 mg/dL (ref 70–99)
Potassium: 4.4 mmol/L (ref 3.5–5.1)
Sodium: 133 mmol/L — ABNORMAL LOW (ref 135–145)

## 2021-03-16 MED ORDER — DAPAGLIFLOZIN PROPANEDIOL 10 MG PO TABS: 10.0000 mg | ORAL_TABLET | Freq: Every day | ORAL | 1 refills | Status: DC

## 2021-03-16 MED ORDER — SPIRONOLACTONE 25 MG PO TABS: 12.5000 mg | ORAL_TABLET | Freq: Every day | ORAL | 0 refills | Status: DC

## 2021-03-16 MED ORDER — LORAZEPAM 1 MG PO TABS: 1.0000 mg | ORAL_TABLET | Freq: Every day | ORAL | Status: DC | PRN

## 2021-03-16 MED ORDER — NICOTINE 21 MG/24HR TD PT24: 21.0000 mg | MEDICATED_PATCH | Freq: Every day | TRANSDERMAL | 0 refills | Status: DC

## 2021-03-16 MED ORDER — CLONAZEPAM 0.5 MG PO TABS: 0.5000 mg | ORAL_TABLET | Freq: Two times a day (BID) | ORAL | 0 refills | Status: DC

## 2021-03-16 MED ORDER — DAPAGLIFLOZIN PROPANEDIOL 10 MG PO TABS
10.0000 mg | ORAL_TABLET | Freq: Every day | ORAL | Status: DC
  Administered 2021-03-16: 10 mg via ORAL
  Filled 2021-03-16: qty 1

## 2021-03-16 MED ORDER — GADOBUTROL 1 MMOL/ML IV SOLN
7.0000 mL | Freq: Once | INTRAVENOUS | Status: AC | PRN
  Administered 2021-03-16: 7 mL via INTRAVENOUS

## 2021-03-16 MED ORDER — MIDODRINE HCL 5 MG PO TABS: 5.0000 mg | ORAL_TABLET | Freq: Three times a day (TID) | ORAL | 0 refills | Status: AC

## 2021-03-16 MED ORDER — LORAZEPAM 2 MG/ML IJ SOLN
0.0000 mg | Freq: Two times a day (BID) | INTRAMUSCULAR | Status: DC
  Administered 2021-03-16: 1 mg via INTRAVENOUS
  Filled 2021-03-16: qty 1

## 2021-03-16 MED ORDER — DIGOXIN 125 MCG PO TABS
0.1250 mg | ORAL_TABLET | Freq: Every day | ORAL | 2 refills | Status: DC
Start: 2021-03-17 — End: 2021-11-12

## 2021-03-16 MED ORDER — THIAMINE HCL 100 MG PO TABS: 100.0000 mg | ORAL_TABLET | Freq: Every day | ORAL | 0 refills | Status: AC

## 2021-03-16 MED ORDER — LORAZEPAM 1 MG PO TABS: 0.0000 mg | ORAL_TABLET | Freq: Two times a day (BID) | ORAL | Status: DC

## 2021-03-16 MED ORDER — MIDODRINE HCL 5 MG PO TABS
5.0000 mg | ORAL_TABLET | Freq: Three times a day (TID) | ORAL | Status: DC
  Administered 2021-03-16: 5 mg via ORAL
  Filled 2021-03-16: qty 1

## 2021-03-16 NOTE — Telephone Encounter (Signed)
-----   Message from Sande Rives, MD sent at 03/16/2021 12:55 PM EDT ----- This patient is being discharged from the hospital today with plans to follow with the Advanced HF clinic. She does not need to see me. She is scheduled for Wednesday with me at [facility].   Gerri Spore T. Flora Lipps, MD, [facility]  [facility]  [address] [place], [place] [address] [phone]  12:55 PM

## 2021-03-16 NOTE — Telephone Encounter (Signed)
Advanced Heart Failure Patient Advocate Encounter  Prior Authorization for Marcelline Deist has been approved.    PA# [id number] Effective dates: 03/16/21 through 03/15/22  Patients co-pay is $142, patient will not use a co-pay card in this case. She has a managed medicaid plan as secondary.  Co-pay should be $3 after Medicaid is applied.  Archer Asa, CPhT

## 2021-03-16 NOTE — Telephone Encounter (Signed)
Patient Advocate Encounter   Received notification from [employer] that prior authorization for Pamela Bryan is required.   PA submitted on CoverMyMeds Key [id number] Status is pending   Will continue to follow.

## 2021-03-16 NOTE — Telephone Encounter (Signed)
I called patient, advised that this appointment on Wednesday was no longer needed.  Patient advised we would cancel this and she can follow with Advanced HF clinic on 04/04.  Cancelled appointment.  Left call back number if questions.

## 2021-03-16 NOTE — Discharge Summary (Signed)
Physician Discharge Summary  Pamela Bryan CHE:[id number] DOB: [birth year] DOA: 03/11/2021  PCP: Patient, No Pcp Per  Admit date: 03/11/2021  Discharge date: 03/16/2021  Admitted From:Home  Disposition:  Home  Recommendations for Outpatient Follow-up:  1. Follow up with PCP in 1-2 weeks 2. Follow-up with heart failure clinic on 03/30/2021 as scheduled 3. Continue on medications as noted below 4. Encourage smoking cessation and prescribed nicotine patch 5. Alcohol cessation discussed  Home Health: None  Equipment/Devices: None  Discharge Condition:Stable  CODE STATUS: Full  Diet recommendation: Heart Healthy  Brief/Interim Summary: Pamela Prejeanis a 50 y.o.femalewith medical history significant foralcohol and tobacco use who presented to the ED for progressive shortness of breath lower extremity swelling. Her symptoms started since January. Shereportedbilateral leg swelling with 10-15 pound weight gain. Patient continues to smoke cigarettes and drinks wine at nighttime,denies any withdrawal symptoms. In the ED,on this admission,patient was noted to have a borderline low blood pressure with tachycardia but pulse ox was normal .[facility] chest x-ray shows developing peribronchial thickening suspicious for pulmonary edema, cardiomegaly and small bilateral pleural effusions which are unchanged. Patient was given IV Lasix 20 mg once and placed on CIWA protocol with Ativan as needed.   Patient was admitted for acute systolic CHF exacerbation with biventricular congestive heart failure.  Patient was seen by cardiology for significantly low systolic function and decompensated congestive heart failure and had undergone cardiac catheterization on 03/13/2021 with EF 15% noted.  She is no longer in heart failure and has diuresed adequately.  Multiple medications have been added to her regimen for treatment of her heart failure and she will undergo cardiac MRI prior to discharge  today.  Acute onsetsystolicCHF with pulmonary edema-biventricular CHF Plan for cardiac MRI on 3/21 prior to discharge Continue on medications to include digoxin, spironolactone, Farxiga, and midodrine as noted below Holding ARB/ARNI due to low blood pressures as well as beta-blocker  Alcohol use disorder.: Continue CIWA protocol with as needed Ativan. 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 Did report patient's father was a recovering alcoholic Started on Klonopin twice daily to assist with cessation of alcohol  Tobacco use use disorder: Continue nicotine patch.Counseling was done  Discharge Diagnoses:  Principal Problem:   Pulmonary edema Active Problems:   CHF (congestive heart failure) (HCC)   Acute systolic heart failure (HCC)   Cirrhosis of liver [facility])    Discharge Instructions  Discharge Instructions    Diet - low sodium heart healthy   Complete by: As directed    Increase activity slowly   Complete by: As directed      Allergies as of 03/16/2021   No Known Allergies     Medication List    STOP taking these medications   doxycycline 100 MG capsule Commonly known as: VIBRAMYCIN   furosemide 40 MG tablet Commonly known as: LASIX   predniSONE 20 MG tablet Commonly known as: DELTASONE     TAKE these medications   clonazePAM 0.5 MG tablet Commonly known as: KlonoPIN Take 1 tablet (0.5 mg total) by mouth 2 (two) times daily for 15 days.   dapagliflozin propanediol 10 MG Tabs tablet Commonly known as: FARXIGA Take 1 tablet (10 mg total) by mouth daily.   digoxin 0.125 MG tablet Commonly known as: LANOXIN Take 1 tablet (0.125 mg total) by mouth daily. Start taking on: March 17, 2021   Liletta (52 MG) 19.5 MCG/DAY Iud IUD  Generic drug: levonorgestrel 1 Intra Uterine Device by Intrauterine route.   midodrine 5 MG  tablet Commonly known as: PROAMATINE Take 1 tablet (5 mg total) by mouth 3 (three) times daily with meals.   multivitamin capsule Take 1 capsule by mouth daily.   nicotine 21 mg/24hr patch Commonly known as: NICODERM CQ - dosed in mg/24 hours Place 1 patch (21 mg total) onto the skin daily. Start taking on: March 17, 2021   spironolactone 25 MG tablet Commonly known as: ALDACTONE Take 0.5 tablets (12.5 mg total) by mouth daily. Start taking on: March 17, 2021   thiamine 100 MG tablet Take 1 tablet (100 mg total) by mouth daily. Start taking on: March 17, 2021       Follow-up Information    [facility] Follow up on 03/30/2021.   Specialty: Cardiology Why: 9:30 AM  [facility], [address] Contact information: [address] [id number] [address]he[place] [address] [phone]             No Known Allergies  Consultations:  Cardiology/heart failure team   Procedures/Studies: DG Chest 2 View  Result Date: 03/11/2021 CLINICAL DATA:  Shortness of breath for 2-3 days.  History of CHF. EXAM: CHEST - 2 VIEW COMPARISON:  Radiograph earlier today FINDINGS: Cardiomegaly. Small bilateral pleural effusions are similar. Ovoid density in the left mid lung likely represents fluid in the fissures on the lateral view. Developing peribronchial thickening. No pneumothorax. No acute osseous abnormalities are seen. IMPRESSION: 1. Developing peribronchial thickening, suspicious for pulmonary edema. 2. Cardiomegaly and small bilateral pleural effusions, unchanged. Ovoid density in the left mid lung likely represents fluid in the fissure. Electronically Signed   By: Narda Rutherford M.D.   On: 03/11/2021 20:29   CT ANGIO CHEST PE W OR WO CONTRAST  Result Date: 03/12/2021 CLINICAL DATA:  Short of breath for 2 days, positive D dimer EXAM: CT ANGIOGRAPHY CHEST WITH  CONTRAST TECHNIQUE: Multidetector CT imaging of the chest was performed using the standard protocol during bolus administration of intravenous contrast. Multiplanar CT image reconstructions and MIPs were obtained to evaluate the vascular anatomy. CONTRAST:  77mL OMNIPAQUE IOHEXOL 350 MG/ML SOLN COMPARISON:  03/11/2021 FINDINGS: Cardiovascular: This is a technically adequate evaluation of the pulmonary vasculature. No filling defects or pulmonary emboli. Heart is markedly enlarged, with severe left ventricular dilatation. No pericardial effusion. 4.1 cm ascending thoracic aortic aneurysm identified. No evidence of dissection. Mediastinum/Nodes: No pathologic adenopathy. Thyroid, trachea, and esophagus are unremarkable. Lungs/Pleura: There is a small right pleural effusion. There is dense right lower lobe atelectasis. Subsegmental atelectasis is seen within the lingula and left lower lobe. A small rounded area of ground-glass airspace disease is seen at the right apex, measuring up to 2.5 cm in diameter, favor focal inflammation or infection. No pneumothorax. Central airways are patent. Upper Abdomen: No acute abnormality. Musculoskeletal: No acute displaced fracture. Reconstructed images demonstrate no additional findings. Review of the MIP images confirms the above findings. IMPRESSION: 1. No evidence of pulmonary embolus. 2. Marked cardiomegaly, with pronounced left ventricular dilatation. 3. Small right pleural effusion. 4. Rounded ground-glass consolidation within the right upper lobe, favor inflammation or infection. 5. 4.1 cm ascending thoracic aortic aneurysm. Recommend annual imaging followup by CTA or MRA. This recommendation follows 2010 ACCF/AHA/AATS/ACR/ASA/SCA/SCAI/SIR/STS/SVM Guidelines for the Diagnosis and Management of Patients with Thoracic Aortic Disease. Circulation. 2010; 121: H212-Y482. Aortic aneurysm NOS (ICD10-I71.9) Electronically  Signed   By: Sharlet Salina M.D.   On: 03/12/2021 02:52    CARDIAC CATHETERIZATION  Result Date: 03/13/2021 Findings: Ao = 86/68 (77) LV = 92/25 RA =  4 RV = 36/10 PA = 36/17 (25) PCW = 20 Fick cardiac output/index = 5.4/3.0 PVR = 1.0 WU SVR = 1088 Ao sat = 98% PA sat = 74%, 78% High SVC sat = 83% Assessment: 1. Normal coronary arteries 2. 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 Elevated filling pressures 4. Cardiac output higher than I expected. Suspect peripheral shunting in setting of probable cirrhosis Plan/Discussion: Continue diuresis. Titrate GDMT. Consider cMRI. Check RUQ u/s to evaluate for cirrhosis. Arvilla Meres, MD 6:15 PM   DG CHEST PORT 1 VIEW  Result Date: 03/15/2021 CLINICAL DATA:  Edema. Additional history provided: Shortness of breath/CHF, evaluate peribronchial thickening, pulmonary edema, bilateral pleural effusions, ovoid density. EXAM: PORTABLE CHEST 1 VIEW COMPARISON:  CT angiogram chest 03/12/2021. Prior chest radiographs 03/11/2021 and earlier. FINDINGS: Please note a small portion of the left lateral costophrenic angle is excluded from the field of view. Unchanged cardiomegaly and central pulmonary vascular congestion. Persistent small right pleural effusion. Superimposed ill-defined right basilar opacity likely reflecting subsegmental atelectasis. The right upper lobe ground-glass opacity demonstrated on the prior chest CT of 03/12/2021 is not appreciable radiographically. A skin fold projects over the right lung apex. Redemonstrated linear atelectasis within the left mid lung. No sizable left pleural effusion. No evidence of pneumothorax. IMPRESSION: Unchanged cardiomegaly and central pulmonary vascular congestion. Persistent small right pleural effusion. Associated right basilar subsegmental atelectasis, improved. The small region of right upper lobe ground-glass opacity demonstrated on the prior chest CT of 03/12/2021 is not appreciable radiographically. Persistent although improved linear atelectasis within the left mid lung. Electronically Signed    By: Jackey Loge DO   On: 03/15/2021 09:23   DG Chest Port 1 View  Result Date: 03/11/2021 CLINICAL DATA:  Shortness of breath with leg swelling EXAM: PORTABLE CHEST 1 VIEW COMPARISON:  None. FINDINGS: Small bilateral pleural effusions with basilar airspace disease. Mild cardiomegaly. Ovoid airspace disease in the left mid lung. No pneumothorax. IMPRESSION: Small bilateral pleural effusions with basilar airspace disease, atelectasis versus pneumonia. Mild cardiomegaly. Electronically Signed   By: Jasmine Pang M.D.   On: 03/11/2021 00:17   VAS [place] LOWER EXTREMITY VENOUS (DVT)  Result Date: 03/12/2021  Lower Venous DVT Study Indications: Swelling, and SOB.  Comparison Study: No prior study on file Performing Technologist: Sherren Kerns RVS  Examination Guidelines: A complete evaluation includes B-mode imaging, spectral Doppler, color Doppler, and power Doppler as needed of all accessible portions of each vessel. Bilateral testing is considered an integral part of a complete examination. Limited examinations for reoccurring indications may be performed as noted. The reflux portion of the exam is performed with the patient in reverse Trendelenburg.  +---------+---------------+---------+-----------+----------+-------------------+ RIGHT    CompressibilityPhasicitySpontaneityPropertiesThrombus Aging      +---------+---------------+---------+-----------+----------+-------------------+ CFV      Full                                         pulsatile waveforms +---------+---------------+---------+-----------+----------+-------------------+ SFJ      Full                                                             +---------+---------------+---------+-----------+----------+-------------------+  FV Prox  Full                                                             +---------+---------------+---------+-----------+----------+-------------------+ FV Mid   Full                                                              +---------+---------------+---------+-----------+----------+-------------------+ FV DistalFull                                                             +---------+---------------+---------+-----------+----------+-------------------+ PFV      Full                                                             +---------+---------------+---------+-----------+----------+-------------------+ POP      Full                                         pulsatile waveforms +---------+---------------+---------+-----------+----------+-------------------+ PTV      Full                                                             +---------+---------------+---------+-----------+----------+-------------------+ PERO     Full                                                             +---------+---------------+---------+-----------+----------+-------------------+   +---------+---------------+---------+-----------+----------+-------------------+ LEFT     CompressibilityPhasicitySpontaneityPropertiesThrombus Aging      +---------+---------------+---------+-----------+----------+-------------------+ CFV      Full                                         pulsatile waveforms +---------+---------------+---------+-----------+----------+-------------------+ SFJ      Full                                                             +---------+---------------+---------+-----------+----------+-------------------+ FV Prox  Full                                                             +---------+---------------+---------+-----------+----------+-------------------+  FV Mid   Full                                                             +---------+---------------+---------+-----------+----------+-------------------+ FV DistalFull                                                              +---------+---------------+---------+-----------+----------+-------------------+ PFV      Full                                                             +---------+---------------+---------+-----------+----------+-------------------+ POP      Full                                         pulsatile waveforms +---------+---------------+---------+-----------+----------+-------------------+ PTV      Full                                                             +---------+---------------+---------+-----------+----------+-------------------+ PERO     Full                                                             +---------+---------------+---------+-----------+----------+-------------------+     Summary: RIGHT: - There is no evidence of deep vein thrombosis in the lower extremity.  LEFT: - There is no evidence of deep vein thrombosis in the lower extremity.  - A cystic structure is found in the popliteal fossa.  *See table(s) above for measurements and observations. Electronically signed by Lemar Livings MD on 03/12/2021 at 5:52:53 PM.    Final    US Abdomen Limited RUQ (LIVER/GB)  Result Date: 03/14/2021 CLINICAL DATA:  Cirrhosis of the liver. EXAM: ULTRASOUND ABDOMEN LIMITED RIGHT UPPER QUADRANT COMPARISON:  CT abdomen pelvis 03/12/2021 FINDINGS: Gallbladder: Surgically absent. Common bile duct: Diameter: 0.3 cm, within normal limits. Liver: No focal lesion identified. Mildly nodular contour and slight coarsening of the echotexture. Within normal limits in parenchymal echogenicity. Portal vein is patent on color Doppler imaging with normal direction of blood flow towards the liver. Other: Right pleural effusion noted. IMPRESSION: 1. Appearance of the liver in keeping with history of cirrhosis. No focal liver lesion identified. 2.  Incidentally noted right pleural effusion. Electronically Signed   By: Emmaline Kluver M.D.   On: 03/14/2021 16:55      Discharge Exam: Vitals:    03/16/21 0526 03/16/21 0755  BP: (!) 89/56 91/63  Pulse: 91 81  Resp: 17 20  Temp: 98.2 F (36.8 C) 98.3 F (36.8 C)  SpO2: 96% 97%   Vitals:   03/15/21 1936 03/16/21 0121 03/16/21 0526 03/16/21 0755  BP: 105/66 94/66 (!) 89/56 91/63  Pulse: 100 72 91 81  Resp: 17 20 17 20   Temp: 98.5 F (36.9 C) 98.2 F (36.8 C) 98.2 F (36.8 C) 98.3 F (36.8 C)  TempSrc: Oral Oral Oral Oral  SpO2: 98% 98% 96% 97%  Weight:   66.5 kg   Height:        General: Pt is alert, awake, not in acute distress Cardiovascular: RRR, S1/S2 +, no rubs, no gallops Respiratory: CTA bilaterally, no wheezing, no rhonchi Abdominal: Soft, NT, ND, bowel sounds + Extremities: no edema, no cyanosis    The results of significant diagnostics from this hospitalization (including imaging, microbiology, ancillary and laboratory) are listed below for reference.     Microbiology: Recent Results (from the past 240 hour(s))  Resp Panel by RT-PCR (Flu A&B, Covid) Nasopharyngeal Swab     Status: None   Collection Time: 03/11/21 11:02 PM   Specimen: Nasopharyngeal Swab; Nasopharyngeal(NP) swabs in vial transport medium  Result Value Ref Range Status   SARS Coronavirus 2 by RT PCR NEGATIVE NEGATIVE Final    Comment: (NOTE) SARS-CoV-2 target nucleic acids are NOT DETECTED.  The SARS-CoV-2 RNA is generally detectable in upper respiratory specimens during the acute phase of infection. The lowest concentration of SARS-CoV-2 viral copies this assay can detect is 138 copies/mL. A negative result does not preclude SARS-Cov-2 infection and should not be used as the sole basis for treatment or other patient management decisions. A negative result may occur with  improper specimen collection/handling, submission of specimen other than nasopharyngeal swab, presence of viral mutation(s) within the areas targeted by this assay, and inadequate number of viral copies(<138 copies/mL). A negative result must be combined  with clinical observations, patient history, and epidemiological information. The expected result is Negative.  Fact Sheet for Patients:  03/13/21  Fact Sheet for Healthcare Providers:  BloggerCourse.com  This test is no t yet approved or cleared by the [place] FDA and  has been authorized for detection and/or diagnosis of SARS-CoV-2 by FDA under an Emergency Use Authorization (EUA). This EUA will remain  in effect (meaning this test can be used) for the duration of the COVID-19 declaration under Section 564(b)(1) of the Act, 21 U.S.C.section 360bbb-3(b)(1), unless the authorization is terminated  or revoked sooner.       Influenza A by PCR NEGATIVE NEGATIVE Final   Influenza B by PCR NEGATIVE NEGATIVE Final    Comment: (NOTE) The Xpert Xpress SARS-CoV-2/FLU/RSV plus assay is intended as an aid in the diagnosis of influenza from Nasopharyngeal swab specimens and should not be used as a sole basis for treatment. Nasal washings and aspirates are unacceptable for Xpert Xpress SARS-CoV-2/FLU/RSV testing.  Fact Sheet for Patients: [place]  Fact Sheet for Healthcare Providers: BloggerCourse.com  This test is not yet approved or cleared by the [place] FDA and has been authorized for detection and/or diagnosis of SARS-CoV-2 by FDA under an Emergency Use Authorization (EUA). This EUA will remain in effect (meaning this test can be used) for the duration of the COVID-19 declaration under Section 564(b)(1) of the Act, 21 U.S.C. section 360bbb-3(b)(1), unless the authorization is terminated or revoked.  Performed at [facility], [address], [place], [place]      Labs: BNP (last 3 results) Recent  Labs    03/10/21 2253 03/13/21 0953  BNP 2,348.9* 2,254.2*   Basic Metabolic Panel: Recent Labs  Lab 03/12/21 0240 03/13/21 0144  03/13/21 1750 03/13/21 1756 03/13/21 1800 03/14/21 0120 03/15/21 0151 03/16/21 0216  NA 136 134*   < > 136 142 135 134* 133*  K 3.9 3.7   < > 4.7 3.8 4.7 4.6 4.4  CL 99 99  --   --   --  102 98 101  CO2 26 25  --   --   --  23 25 24   GLUCOSE 111* 121*  --   --   --  122* 94 96  BUN 14 19  --   --   --  19 21* 23*  CREATININE 0.88 0.95  --   --   --  0.93 0.88 0.98  CALCIUM 8.8* 8.9  --   --   --  9.1 9.1 9.1  MG 1.8 1.8  --   --   --  2.1  --   --   PHOS  --  5.0*  --   --   --  5.3*  --   --    < > = values in this interval not displayed.   Liver Function Tests: Recent Labs  Lab 03/10/21 2253 03/13/21 0144 03/14/21 0120  AST 48* 32 30  ALT 74* 55* 45*  ALKPHOS 67 71 77  BILITOT 0.5 1.3* 1.2  PROT 5.6* 5.4* 5.5*  ALBUMIN 3.8 3.1* 3.2*   No results for input(s): LIPASE, AMYLASE in the last 168 hours. No results for input(s): AMMONIA in the last 168 hours. CBC: Recent Labs  Lab 03/10/21 2253 03/11/21 2008 03/12/21 0240 03/13/21 0144 03/13/21 1750 03/13/21 1752 03/13/21 1756 03/13/21 1800  WBC 8.9 7.2 6.5 7.4  --   --   --   --   HGB 14.5 15.4* 14.9 14.9 11.9* 16.0* 16.3* 13.9  HCT 41.5 44.4 42.8 42.8 35.0* 47.0* 48.0* 41.0  MCV 98.8 100.0 97.5 97.5  --   --   --   --   PLT 274 308 314 300  --   --   --   --    Cardiac Enzymes: No results for input(s): CKTOTAL, CKMB, CKMBINDEX, TROPONINI in the last 168 hours. BNP: Invalid input(s): POCBNP CBG: No results for input(s): GLUCAP in the last 168 hours. D-Dimer No results for input(s): DDIMER in the last 72 hours. Hgb A1c No results for input(s): HGBA1C in the last 72 hours. Lipid Profile No results for input(s): CHOL, HDL, LDLCALC, TRIG, CHOLHDL, LDLDIRECT in the last 72 hours. Thyroid function studies No results for input(s): TSH, T4TOTAL, T3FREE, THYROIDAB in the last 72 hours.  Invalid input(s): FREET3 Anemia work up No results for input(s): VITAMINB12, FOLATE, FERRITIN, TIBC, IRON, RETICCTPCT in the  last 72 hours. Urinalysis    Component Value Date/Time   COLORURINE COLORLESS (A) 03/11/2021 2232   APPEARANCEUR CLEAR 03/11/2021 2232   LABSPEC 1.004 (L) 03/11/2021 2232   PHURINE 6.0 03/11/2021 2232   GLUCOSEU NEGATIVE 03/11/2021 2232   HGBUR NEGATIVE 03/11/2021 2232   BILIRUBINUR NEGATIVE 03/11/2021 2232   KETONESUR NEGATIVE 03/11/2021 2232   PROTEINUR NEGATIVE 03/11/2021 2232   NITRITE NEGATIVE 03/11/2021 2232   LEUKOCYTESUR NEGATIVE 03/11/2021 2232   Sepsis Labs Invalid input(s): PROCALCITONIN,  WBC,  LACTICIDVEN Microbiology Recent Results (from the past 240 hour(s))  Resp Panel by RT-PCR (Flu A&B, Covid) Nasopharyngeal Swab     Status: None   Collection  Time: 03/11/21 11:02 PM   Specimen: Nasopharyngeal Swab; Nasopharyngeal(NP) swabs in vial transport medium  Result Value Ref Range Status   SARS Coronavirus 2 by RT PCR NEGATIVE NEGATIVE Final    Comment: (NOTE) SARS-CoV-2 target nucleic acids are NOT DETECTED.  The SARS-CoV-2 RNA is generally detectable in upper respiratory specimens during the acute phase of infection. The lowest concentration of SARS-CoV-2 viral copies this assay can detect is 138 copies/mL. A negative result does not preclude SARS-Cov-2 infection and should not be used as the sole basis for treatment or other patient management decisions. A negative result may occur with  improper specimen collection/handling, submission of specimen other than nasopharyngeal swab, presence of viral mutation(s) within the areas targeted by this assay, and inadequate number of viral copies(<138 copies/mL). A negative result must be combined with clinical observations, patient history, and epidemiological information. The expected result is Negative.  Fact Sheet for Patients:  BloggerCourse.com  Fact Sheet for Healthcare Providers:  [employer]  This test is no t yet approved or cleared by the [place]  FDA and  has been authorized for detection and/or diagnosis of SARS-CoV-2 by FDA under an Emergency Use Authorization (EUA). This EUA will remain  in effect (meaning this test can be used) for the duration of the COVID-19 declaration under Section 564(b)(1) of the Act, 21 U.S.C.section 360bbb-3(b)(1), unless the authorization is terminated  or revoked sooner.       Influenza A by PCR NEGATIVE NEGATIVE Final   Influenza B by PCR NEGATIVE NEGATIVE Final    Comment: (NOTE) The Xpert Xpress SARS-CoV-2/FLU/RSV plus assay is intended as an aid in the diagnosis of influenza from Nasopharyngeal swab specimens and should not be used as a sole basis for treatment. Nasal washings and aspirates are unacceptable for Xpert Xpress SARS-CoV-2/FLU/RSV testing.  Fact Sheet for Patients: BloggerCourse.com  Fact Sheet for Healthcare Providers: [employer]  This test is not yet approved or cleared by the [place] FDA and has been authorized for detection and/or diagnosis of SARS-CoV-2 by FDA under an Emergency Use Authorization (EUA). This EUA will remain in effect (meaning this test can be used) for the duration of the COVID-19 declaration under Section 564(b)(1) of the Act, 21 U.S.C. section 360bbb-3(b)(1), unless the authorization is terminated or revoked.  Performed at [facility] [facility] Lab, [address] [place], [place] [address]      Time coordinating discharge: 35 minutes  SIGNED:   Erick Blinks, DO [facility] 03/16/2021, 12:09 PM  If 7PM-7AM, please contact night-coverage [email or web address]

## 2021-03-16 NOTE — Progress Notes (Addendum)
Advanced Heart Failure Team Rounding Note   Primary Physician: none Primary Cardiologist:  none  Reason for Consultation: acute systolic biventricular CHF  HPI:     Cath 3/18 EF 15% No CAD. Elevated filling pressures. High cardiac output.  Ab u/s with evidence of cirrhosis but no portal HTN   Volume status improved w/ diuresis. No dyspnea. BP remains soft. Awaiting cardiac MRI.    Objective:    Vital Signs:   Temp:  [98.2 F (36.8 C)-98.5 F (36.9 C)] 98.3 F (36.8 C) (03/21 0755) Pulse Rate:  [72-103] 81 (03/21 0755) Resp:  [17-20] 20 (03/21 0755) BP: (89-106)/(56-80) 91/63 (03/21 0755) SpO2:  [95 %-100 %] 97 % (03/21 0755) Weight:  [66.5 kg] 66.5 kg (03/21 0526) Last BM Date: 03/10/21  Weight change: Filed Weights   03/14/21 0415 03/15/21 0547 03/16/21 0526  Weight: 67 kg 65.6 kg 66.5 kg    Intake/Output:  No intake or output data in the 24 hours ending 03/16/21 0958    Physical Exam    PHYSICAL EXAM: General:  Well appearing. No respiratory difficulty HEENT: normal Neck: supple. no JVD. Carotids 2+ bilat; no bruits. No lymphadenopathy or thyromegaly appreciated. Cor: PMI nondisplaced. Regular rate & rhythm. No rubs, gallops or murmurs. Lungs: clear Abdomen: soft, nontender, nondistended. No hepatosplenomegaly. No bruits or masses. Good bowel sounds. Extremities: no cyanosis, clubbing, rash, edema Neuro: alert & oriented x 3, cranial nerves grossly intact. moves all 4 extremities w/o difficulty. Affect pleasant.   Telemetry   Sinus tach low 100s Personally reviewed   Labs   Basic Metabolic Panel: Recent Labs  Lab 03/12/21 0240 03/13/21 0144 03/13/21 1750 03/13/21 1756 03/13/21 1800 03/14/21 0120 03/15/21 0151 03/16/21 0216  NA 136 134*   < > 136 142 135 134* 133*  K 3.9 3.7   < > 4.7 3.8 4.7 4.6 4.4  CL 99 99  --   --   --  102 98 101  CO2 26 25  --   --   --  23 25 24   GLUCOSE 111* 121*  --   --   --  122* 94 96  BUN 14 19  --   --    --  19 21* 23*  CREATININE 0.88 0.95  --   --   --  0.93 0.88 0.98  CALCIUM 8.8* 8.9  --   --   --  9.1 9.1 9.1  MG 1.8 1.8  --   --   --  2.1  --   --   PHOS  --  5.0*  --   --   --  5.3*  --   --    < > = values in this interval not displayed.    Liver Function Tests: Recent Labs  Lab 03/10/21 2253 03/13/21 0144 03/14/21 0120  AST 48* 32 30  ALT 74* 55* 45*  ALKPHOS 67 71 77  BILITOT 0.5 1.3* 1.2  PROT 5.6* 5.4* 5.5*  ALBUMIN 3.8 3.1* 3.2*   No results for input(s): LIPASE, AMYLASE in the last 168 hours. No results for input(s): AMMONIA in the last 168 hours.  CBC: Recent Labs  Lab 03/10/21 2253 03/11/21 2008 03/12/21 0240 03/13/21 0144 03/13/21 1750 03/13/21 1752 03/13/21 1756 03/13/21 1800  WBC 8.9 7.2 6.5 7.4  --   --   --   --   HGB 14.5 15.4* 14.9 14.9 11.9* 16.0* 16.3* 13.9  HCT 41.5 44.4 42.8 42.8 35.0* 47.0* 48.0* 41.0  MCV 98.8 100.0 97.5 97.5  --   --   --   --   PLT 274 308 314 300  --   --   --   --     Cardiac Enzymes: No results for input(s): CKTOTAL, CKMB, CKMBINDEX, TROPONINI in the last 168 hours.  BNP: BNP (last 3 results) Recent Labs    03/10/21 2253 03/13/21 0953  BNP 2,348.9* 2,254.2*    ProBNP (last 3 results) No results for input(s): PROBNP in the last 8760 hours.   CBG: No results for input(s): GLUCAP in the last 168 hours.  Coagulation Studies: No results for input(s): LABPROT, INR in the last 72 hours.     Assessment/Plan   1. Acute systolic CHF, Biventricular CHF - ECHO 03/12/21 EF 15% severe RV dysfunction - Cath 03/13/21 EF 15% No CAD. Elevated filling pressures. High cardiac output.  - Suspect ETOH CM. TSH normal, UDS negative - Volume status much improved.   - Continue digoxin 0.125  - Continue spiro 12.5 mg  - BP too low for ARB/ARNI - avoid bb for now with severely low EF and decompensation - add Farxiga 10  - Plan cMRI  Today    2. High cardiac output on cath - RUQ u/s suggestive of cirrhosis - BP  soft, increase midodrine to 5 tid   3. Alcohol Use disorder: - on CIWA discussed cessation  - per primary  4. Tobacco Use disorder: -on nicotine patch, discussed cessation  5. Snoring - will need outpatient sleep study  Ok for d/c from HF standpoint after cMRI is completed. We will arrange post hospital f/u in the [facility] next week and will place appt info in AVS  Cardiac Meds for discharge home Farxiga 10 mg  Digoxin 0.125 mg  Midodrine 5 mg tid Spironolactone 12.5 mg daily     Length of Stay: [address] PA-C  03/16/2021, 9:58 AM  Advanced Heart Failure Team Pager [phone] (M-F; 7a - 4p)  Please contact [facility] for night-coverage after hours (4p -7a ) and weekends on [email or web address]  Patient seen and examined with the above-signed Advanced Practice Provider and/or Housestaff. I personally reviewed laboratory data, imaging studies and relevant notes. I independently examined the patient and formulated the important aspects of the plan. I have edited the note to reflect any of my changes or salient points. I have personally discussed the plan with the patient and/or family.  Breathing BP. Volume status ok. BP remains soft.   General:  Well appearing. No resp difficulty HEENT: normal Neck: supple. no JVD. Carotids 2+ bilat; no bruits. No lymphadenopathy or thryomegaly appreciated. Cor: PMI nondisplaced. Regular rate & rhythm. No rubs, gallops or murmurs. Lungs: clear Abdomen: soft, nontender, nondistended. No hepatosplenomegaly. No bruits or masses. Good bowel sounds. Extremities: no cyanosis, clubbing, rash, edema Neuro: alert & orientedx3, cranial nerves grossly intact. moves all 4 extremities w/o difficulty. Affect pleasant  Stable from HF perspective though BP remains low. Agree with increasing midodrine. Add Farxiga for HF benefit and volume control. PharmD providing med assistance. Can go home from our standpoint after cMRI complete. Will arrange HF f/u.  Stressed need for ETOH cessation.   Would strongly consider adding klnonpin 0.5 bid on d/c to help with ETOH avoidance.   Arvilla Meres, MD  11:50 AM

## 2021-03-18 ENCOUNTER — Ambulatory Visit: Payer: No Typology Code available for payment source | Admitting: Cardiovascular Disease

## 2021-03-29 NOTE — Progress Notes (Addendum)
PCP: [facility] Primary Cardiologist: Dr Gala Romney  HPI: 50 yo female with PMH of alcohol use disorder, tobacco use disorder, and recently diagnosed NICM--> HFrEF.   Admitted with 03/10/21 with worsening bilateral leg edema. Cath with preserved cardiac output and severely reduced EF. MRI concerning for possible myocarditis. Started on GDMT. Counseled stop drinking alcohol.   Today she returns for post HF follow up.Overall feeling fine. Denies SOB/PND/Orthopnea. Reportedly snores at night.  Walking 16,000 steps a day. Appetite ok. No fever or chills. Weight at home has been stable. Not drinking. Now using nicoderm patch but having hard time not smoking. Asking for Chantix. Going AA meetings. Taking all medications. Working full time at [employer] at [employer].   RHC/LHC 02/2021  Ao = 86/68 (77) LV = 92/25 RA =  4 RV = 36/10 PA = 36/17 (25) PCW = 20 Fick cardiac output/index = 5.4/3.0 PVR = 1.0 WU SVR = 1088 Ao sat = 98% PA sat = 74%, 78% High SVC sat = 83% Assessment: 1. Normal coronary arteries 2. 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 Elevated filling pressures 4. Cardiac output higher than I expected. Suspect peripheral shunting in setting of probable cirrhosis  CMRI 02/2021. Severely dilated left ventricle with EF 11%, diffuse hypokinesis. 2.  Mildly dilated right ventricle with EF 33%. 3. Abnormal aortic valve, looks functionally bicuspid. By regurgitant fraction, there appears to be severe aortic stenosis. Would consider TEE confirmation. 4. Delayed enhancement imaging shows inferoseptal RV insertion site LGE which tends to be nonspecific and related to volume/pressure overload. However, the lateral wall subepicardial LGE is concerning for possible prior myocarditis (less likely sarcoidosis). It is not in a CAD pattern.  Echo  03/12/21 EF < 20% RV normal  ROS: All systems negative except as listed in HPI, PMH and Problem List.  SH:  Social History   Socioeconomic History  . Marital  status: Divorced    Spouse name: Not on file  . Number of children: Not on file  . Years of education: Not on file  . Highest education level: Not on file  Occupational History  . Not on file  Tobacco Use  . Smoking status: Current Every Day Smoker  . Smokeless tobacco: Not on file  Substance and Sexual Activity  . Alcohol use: Yes    Comment: occasional  . Drug use: No  . Sexual activity: Not on file  Other Topics Concern  . Not on file  Social History Narrative  . Not on file   Social Determinants of Health   Financial Resource Strain: Not on file  Food Insecurity: Not on file  Transportation Needs: Not on file  Physical Activity: Not on file  Stress: Not on file  Social Connections: Not on file  Intimate Partner Violence: Not on file    FH:  Family History  Problem Relation Age of Onset  . Valvular heart disease Sister     Past Medical History:  Diagnosis Date  . CHF (congestive heart failure) (HCC)     Current Outpatient Medications  Medication Sig Dispense Refill  . clonazePAM (KLONOPIN) 0.5 MG tablet Take 1 tablet (0.5 mg total) by mouth 2 (two) times daily for 15 days. 30 tablet 0  . dapagliflozin propanediol (FARXIGA) 10 MG TABS tablet Take 1 tablet (10 mg total) by mouth daily. 30 tablet 1  . digoxin (LANOXIN) 0.125 MG tablet Take 1 tablet (0.125 mg total) by mouth daily. 30 tablet 2  . levonorgestrel (LILETTA, 52 MG,) 19.5 MCG/DAY IUD IUD  1 Intra Uterine Device by Intrauterine route.    . midodrine (PROAMATINE) 5 MG tablet Take 1 tablet (5 mg total) by mouth 3 (three) times daily with meals. 90 tablet 0  . Multiple Vitamin (MULTIVITAMIN) capsule Take 1 capsule by mouth daily.    . nicotine (NICODERM CQ - DOSED IN MG/24 HOURS) 21 mg/24hr patch Place 1 patch (21 mg total) onto the skin daily. 28 patch 0  . spironolactone (ALDACTONE) 25 MG tablet Take 0.5 tablets (12.5 mg total) by mouth daily. 15 tablet 0  . thiamine 100 MG tablet Take 1 tablet (100 mg  total) by mouth daily. 30 tablet 0   No current facility-administered medications for this encounter.    Vitals:   03/30/21 0942  BP: 108/78  Pulse: (!) 112  SpO2: 98%  Weight: 66.4 kg   Wt Readings from Last 3 Encounters:  03/30/21 66.4 kg  03/16/21 66.5 kg  03/10/21 74.8 kg    PHYSICAL EXAM: General:  Well appearing. No resp difficulty HEENT: normal Neck: supple. JVP flat. Carotids 2+ bilaterally; no bruits. No lymphadenopathy or thryomegaly appreciated. Cor: PMI normal. Regular rate & rhythm. Tachy regular. No rubs, gallops or murmurs. Lungs: clear Abdomen: soft, nontender, nondistended. No hepatosplenomegaly. No bruits or masses. Good bowel sounds. Extremities: no cyanosis, clubbing, rash, edema Neuro: alert & orientedx3, cranial nerves grossly intact. Moves all 4 extremities w/o difficulty. Affect pleasant.   ECG: ST 108 bpm   ASSESSMENT & PLAN: 1. Chronic systolic CHF, Biventricular CHF - ECHO 03/12/21 EF 15% severe RV dysfunction - Cath 03/13/21 EF 15% No CAD. Elevated filling pressures. High cardiac output.  -03/16/2021 CMRI EF < 20%  Possible myocaritis.  - Suspect ETOH CM. TSH normal, UDS negative -NYHA II. Volume status stable.  - Continue digoxin 0.125  - Continue spiro 12.5 mg  - BP too low for ARB/ARNI - avoid bb for now with severely low EF and decompensation - Add 5 mg ivabradine twice a day.  - Continue  Farxiga 10  -Check BMET and  dig level  - Plan to repeat ECHO in 3 months.   2. High cardiac output on cath - RUQ u/s suggestive of cirrhosis - Continue  midodrine to 5 tid   3. Alcohol Use disorder: - Congratulated on cessation progress and attending AA meetings!  4. Tobacco Use disorder: - Discussed cessation  -on nicotine patch -Chantix is not available and remains on recall.  - Start wellbutrin 150 mg daily x3 days then 150 mg twice a day to assist with smoking cessation   5. Snoring Set up home sleep study.    Check BMET & CBCand  EKG  Follow up in 3 months.  Jahmad Petrich NP-C  10:31 AM

## 2021-03-30 ENCOUNTER — Ambulatory Visit (HOSPITAL_COMMUNITY)
Admit: 2021-03-30 | Discharge: 2021-03-30 | Disposition: A | Discharge status: Home / Self Care | Payer: No Typology Code available for payment source | Source: Ambulatory Visit | Attending: Adult Health | Admitting: Adult Health

## 2021-03-30 ENCOUNTER — Encounter (HOSPITAL_COMMUNITY): Payer: Self-pay

## 2021-03-30 ENCOUNTER — Other Ambulatory Visit: Payer: Self-pay

## 2021-03-30 VITALS — BP 108/78 | HR 112 | Wt 146.4 lb

## 2021-03-30 DIAGNOSIS — Z79899 Other long term (current) drug therapy: Secondary | ICD-10-CM | POA: Insufficient documentation

## 2021-03-30 DIAGNOSIS — R0683 Snoring: Secondary | ICD-10-CM

## 2021-03-30 DIAGNOSIS — F1010 Alcohol abuse, uncomplicated: Secondary | ICD-10-CM

## 2021-03-30 DIAGNOSIS — I5082 Biventricular heart failure: Secondary | ICD-10-CM | POA: Insufficient documentation

## 2021-03-30 DIAGNOSIS — I428 Other cardiomyopathies: Secondary | ICD-10-CM | POA: Insufficient documentation

## 2021-03-30 DIAGNOSIS — F17200 Nicotine dependence, unspecified, uncomplicated: Secondary | ICD-10-CM | POA: Insufficient documentation

## 2021-03-30 DIAGNOSIS — F1020 Alcohol dependence, uncomplicated: Secondary | ICD-10-CM | POA: Diagnosis not present

## 2021-03-30 DIAGNOSIS — Z793 Long term (current) use of hormonal contraceptives: Secondary | ICD-10-CM | POA: Insufficient documentation

## 2021-03-30 DIAGNOSIS — Z7984 Long term (current) use of oral hypoglycemic drugs: Secondary | ICD-10-CM | POA: Diagnosis not present

## 2021-03-30 DIAGNOSIS — I5022 Chronic systolic (congestive) heart failure: Secondary | ICD-10-CM | POA: Diagnosis present

## 2021-03-30 DIAGNOSIS — Z720 Tobacco use: Secondary | ICD-10-CM | POA: Diagnosis not present

## 2021-03-30 LAB — BASIC METABOLIC PANEL
Anion gap: 13 (ref 5–15)
BUN: 15 mg/dL (ref 6–20)
CO2: 28 mmol/L (ref 22–32)
Calcium: 9.7 mg/dL (ref 8.9–10.3)
Chloride: 98 mmol/L (ref 98–111)
Creatinine, Ser: 0.98 mg/dL (ref 0.44–1.00)
GFR, Estimated: >60 mL/min (ref >60)
Glucose, Bld: 89 mg/dL (ref 70–99)
Potassium: 4.4 mmol/L (ref 3.5–5.1)
Sodium: 139 mmol/L (ref 135–145)

## 2021-03-30 LAB — DIGOXIN LEVEL: Digoxin Level: 0.6 ng/mL — ABNORMAL LOW (ref 0.8–2.0)

## 2021-03-30 MED ORDER — IVABRADINE HCL 5 MG PO TABS: 5.0000 mg | ORAL_TABLET | Freq: Two times a day (BID) | ORAL | 11 refills | Status: DC

## 2021-03-30 MED ORDER — BUPROPION HCL ER (SR) 150 MG PO TB12: ORAL_TABLET | ORAL | 11 refills | Status: DC

## 2021-03-30 NOTE — Progress Notes (Signed)
Patient given a home sleep study device and instructed how to perform test during clinic appt today.

## 2021-03-30 NOTE — Addendum Note (Signed)
Encounter addended by: Sherald Hess, NP on: 03/30/2021 11:08 AM  Actions taken: Clinical Note Signed

## 2021-03-30 NOTE — Patient Instructions (Signed)
START Corlanor 5 mg, one tab twice a day START Wellbutrin 150 mg daily for 3 days, the 150 mg  Twice a day thereafter   Labs today We will only contact you if something comes back abnormal or we need to make some changes. Otherwise no news is good news!  Your physician has recommended that you have a sleep study. This test records several body functions during sleep, including: brain activity, eye movement, oxygen and carbon dioxide blood levels, heart rate and rhythm, breathing rate and rhythm, the flow of air through your mouth and nose, snoring, body muscle movements, and chest and belly movement.  Your physician recommends that you schedule a follow-up appointment in: 5-6 weeks with the pharmacy team, 6-8 weeks  in the Advanced Practitioners (PA/NP) Clinic, and 8-12 weeks with Dr Gala Romney and echocardiogram  Your physician has requested that you have an echocardiogram. Echocardiography is a painless test that uses sound waves to create images of your heart. It provides your doctor with information about the size and shape of your heart and how well your heart's chambers and valves are working. This procedure takes approximately one hour. There are no restrictions for this procedure.   At the [facility] Clinic, you and your health needs are our priority. As part of our continuing mission to provide you with exceptional heart care, we have created designated Provider Care Teams. These Care Teams include your primary Cardiologist (physician) and Advanced Practice Providers (APPs- Physician Assistants and Nurse Practitioners) who all work together to provide you with the care you need, when you need it.   You may see any of the following providers on your designated Care Team at your next follow up: Marland Kitchen Dr Arvilla Meres . Dr Marca Ancona . Dr Thornell Mule . Tonye Becket, NP . Robbie Lis, PA . Shanda Bumps [place],NP . Karle Plumber, PharmD   Please be sure to bring in all  your medications bottles to every appointment.   If you have any questions or concerns before your next appointment please send [place] a message through [place] or call our office at [phone].    TO LEAVE A MESSAGE FOR THE NURSE SELECT OPTION 2, PLEASE LEAVE A MESSAGE INCLUDING: . YOUR NAME . DATE OF BIRTH . CALL BACK NUMBER . REASON FOR CALL**this is important as we prioritize the call backs  YOU WILL RECEIVE A CALL BACK THE SAME DAY AS LONG AS YOU CALL BEFORE 4:00 PM  Please see our updated No Show and Same Day Appointment Cancellation Policy attached to your AVS.

## 2021-03-30 NOTE — Progress Notes (Signed)
Patient Name: Nyra Anspaugh        DOB: 1971/02/05       Height: 5'7"    Weight: 146lb  Office Name:[facility]         Referring Provider:Amy Clegg,NP/ Truman Hayward  Today's Date: 03/30/2021    STOP BANG RISK ASSESSMENT S (snore) Have you been told that you snore?     YES   T (tired) Are you often tired, fatigued, or sleepy during the day?   YES  O (obstruction) Do you stop breathing, choke, or gasp during sleep? YE   P (pressure) Do you have or are you being treated for high blood pressure? NO   B (BMI) Is your body index greater than 35 kg/m? NO   A (age) Are you 43 years old or older? NO   N (neck) Do you have a neck circumference greater than 16 inches?   NO   G (gender) Are you a female? NO   TOTAL STOP/BANG "YES" ANSWERS                                                                        For Office Use Only              Procedure Order Form    YES to 3+ Stop Bang questions OR two clinical symptoms - patient qualifies for WatchPAT (CPT 95800)             Clinical Notes: Will consult Sleep Specialist and refer for management of therapy due to patient increased risk of Sleep Apnea. Ordering a sleep study due to the following two clinical symptoms: Excessive daytime sleepiness G47.10 / Loud snoring R06.83 / Unrefreshed by sleep G47.8/ Insomnia G47.00    I understand that I am proceeding with a home sleep apnea test as ordered by my treating physician. I understand that untreated sleep apnea is a serious cardiovascular risk factor and it is my responsibility to perform the test and seek management for sleep apnea. I will be contacted with the results and be managed for sleep apnea by a local sleep physician. I will be receiving equipment and further instructions from [facility]. I shall promptly ship back the equipment via the included mailing label. I understand my insurance will be billed for the test and as the patient I am responsible for  any insurance related out-of-pocket costs incurred. I have been provided with written instructions and can call for additional video or telephonic instruction, with 24-hour availability of qualified personnel to answer any questions: Patient Help Desk [phone].  Patient Signature ______________________________________________________   Date______________________ Patient Telemedicine Verbal Consent

## 2021-03-30 NOTE — Addendum Note (Signed)
Encounter addended by: Theresia Bough, CMA on: 03/30/2021 10:43 AM  Actions taken: Clinical Note Signed

## 2021-03-31 ENCOUNTER — Telehealth (HOSPITAL_COMMUNITY): Payer: Self-pay | Admitting: Surgery

## 2021-03-31 NOTE — Telephone Encounter (Signed)
I called and left message to inform patient that she can proceed with home sleep study as no insurance prior authorization is required.

## 2021-04-10 ENCOUNTER — Telehealth (HOSPITAL_COMMUNITY): Payer: Self-pay

## 2021-04-10 MED ORDER — FUROSEMIDE 40 MG PO TABS: 40.0000 mg | ORAL_TABLET | Freq: Every day | ORAL | 0 refills | Status: DC | PRN

## 2021-04-10 NOTE — Telephone Encounter (Signed)
Patient advised and verbalized understanding. Rx sent into patients pharmacy.   Meds ordered this encounter  Medications   furosemide (LASIX) 40 MG tablet    Sig: Take 1 tablet (40 mg total) by mouth daily as needed for fluid or edema.    Dispense:  15 tablet    Refill:  0

## 2021-04-10 NOTE — Telephone Encounter (Signed)
lmtrc

## 2021-04-10 NOTE — Telephone Encounter (Signed)
Please call.   Instruct to take 40 mg po lasix daily for 2 days then as needed for 3 pound weight gain  Please send in prescription for lasix 40 mg tablet . 15 tablets.   Panayiotis Rainville NP-C  12:17 PM

## 2021-04-10 NOTE — Addendum Note (Signed)
Addended by: Rob Bunting R on: 04/10/2021 02:55 PM   Modules accepted: Orders

## 2021-04-10 NOTE — Telephone Encounter (Signed)
Patient called to report that she has had a weight gain for 4 pounds over the pat 2-3 days. She denies swelling,chest pain/pressure, dizziness. She does complain of feeling short of breath while laying on her right side at night and had a slight cough last night while laying down. please advise.

## 2021-04-13 ENCOUNTER — Telehealth (HOSPITAL_COMMUNITY): Payer: Self-pay | Admitting: *Deleted

## 2021-04-13 ENCOUNTER — Other Ambulatory Visit (HOSPITAL_COMMUNITY): Payer: Self-pay | Admitting: *Deleted

## 2021-04-13 MED ORDER — DAPAGLIFLOZIN PROPANEDIOL 10 MG PO TABS: 10.0000 mg | ORAL_TABLET | Freq: Every day | ORAL | 0 refills | Status: AC

## 2021-04-13 NOTE — Telephone Encounter (Signed)
Pt left vm stating she was out of town and running out of a medication. I called back to get more information no answer/left vm requesting return call.

## 2021-04-15 ENCOUNTER — Encounter (HOSPITAL_BASED_OUTPATIENT_CLINIC_OR_DEPARTMENT_OTHER): Payer: No Typology Code available for payment source | Admitting: Cardiology

## 2021-04-15 ENCOUNTER — Ambulatory Visit: Payer: No Typology Code available for payment source

## 2021-04-15 DIAGNOSIS — G4734 Idiopathic sleep related nonobstructive alveolar hypoventilation: Secondary | ICD-10-CM | POA: Diagnosis not present

## 2021-04-15 DIAGNOSIS — R0683 Snoring: Secondary | ICD-10-CM

## 2021-04-15 DIAGNOSIS — I5022 Chronic systolic (congestive) heart failure: Secondary | ICD-10-CM

## 2021-04-15 NOTE — Procedures (Signed)
   Sleep Study Report  Patient Information Name: Pamela Bryan  ID: [id number] Birth Date: Jun 27, 1971 Age: 50 Gender:Female Study Date: 04/08/2021 Referring Physician:  Arvilla Meres, MD  TEST DESCRIPTION: Home sleep apnea testing was completed using the WatchPat, a Type 1 device, utilizing peripheral arterial tonometry (PAT), chest movement, actigraphy, pulse oximetry, pulse rate, body position and snore. AHI was calculated with apnea and hypopnea using valid sleep time as the denominator. RDI includes apneas, hypopneas, and RERAs. The data acquired and the scoring of sleep and all associated events were performed in accordance with the recommended standards and specifications as outlined in the AASM Manual for the Scoring of Sleep and Associated Events 2.2.0 (2015).  FINDINGS: 1. No evidence of Obstructive Sleep Apnea with AHI 4.7/hr. 2. No Central Sleep Apnea. 3. Oxygen desaturations as low as 68%. 4. Moderate snoring was present. O2 sats were < 88% for 7.70minutes. 5. Total sleep time was * hrs and * min. 6. 25.7% of total sleep time was spent in REM sleep. 7. Short sleep onset latency at 6 min. 8. Prolonged REM sleep onset latency at 199 min. 9. Total awakenings were 13.  DIAGNOSIS: Nocturnal Hypoxemia.  RECOMMENDATIONS:  1. No significant sleep disordered breathing but nocturnal hypoxemia is present.  2. Healthy sleep recommendations include: adequate nightly sleep (normal 7-9 hrs/night), avoidance of caffeine after noon and alcohol near bedtime, and maintaining a sleep environment that is cool, dark and quiet.  3. Weight loss for overweight patients is recommended.  4. Snoring recommendations include: weight loss where appropriate, side sleeping, and avoidance of alcohol before Bed.  5. Operation of motor vehicle or dangerous equipment must be avoided when feeling drowsy, excessively sleepy, or mentally fatigued.  6. An ENT consultation which may be useful for  specific causes of and possible treatment of bothersome snoring .  7. Weight loss may be of benefit in reducing the severity of snoring.   8. Recommend in lab Polysomnography for further evaluation for nocturnal hypoxemia.  Report prepared by: Signature: Armanda Magic, MD, [facility], Diplomat American Board of Sleep Medicine   Electronically Signed: Apr 15, 2021 Face-to-face demonstration or video or telephone instructions of the recorder's application and use were provided to the patient . A 24-hour support line was available to the patient during testing. Apr 08, 2021,[id number],April 07, 1971 Page 2 of 4 Rev. 318 Printed

## 2021-04-21 ENCOUNTER — Telehealth: Payer: Self-pay | Admitting: *Deleted

## 2021-04-21 ENCOUNTER — Other Ambulatory Visit (HOSPITAL_COMMUNITY): Payer: Self-pay | Admitting: Adult Health

## 2021-04-21 NOTE — Telephone Encounter (Signed)
Informed patient of sleep study results and patient understanding was verbalized. Patient understands her sleep study showed No obvious sleep apnea but has nocturnal hypoxemia - please set up in lab PSG for nocturnal hypoxemia and CHF.

## 2021-04-21 NOTE — Telephone Encounter (Signed)
-----   Message from Quintella Reichert, MD sent at 04/15/2021  1:31 PM EDT ----- No obvious sleep apnea but has nocturnal hypoxemia - please set up in lab PSG for nocturnal hypoxemia and CHF

## 2021-04-22 ENCOUNTER — Telehealth: Payer: Self-pay | Admitting: *Deleted

## 2021-04-22 DIAGNOSIS — I5022 Chronic systolic (congestive) heart failure: Secondary | ICD-10-CM

## 2021-04-22 DIAGNOSIS — G4734 Idiopathic sleep related nonobstructive alveolar hypoventilation: Secondary | ICD-10-CM

## 2021-04-22 NOTE — Telephone Encounter (Signed)
-----   Message from Traci R Turner, MD sent at 04/15/2021  1:31 PM EDT ----- No obvious sleep apnea but has nocturnal hypoxemia - please set up in lab PSG for nocturnal hypoxemia and CHF 

## 2021-04-22 NOTE — Telephone Encounter (Signed)
Informed patient of sleep study results and patient understanding was verbalized. Patient understands her sleep study showed No obvious sleep apnea but has nocturnal hypoxemia. please set up in lab PSG for nocturnal hypoxemia and CHF.  Pt is aware of her results  Left detailed message on voicemail and informed patient to call back with questions.

## 2021-04-24 ENCOUNTER — Other Ambulatory Visit (HOSPITAL_COMMUNITY): Payer: Self-pay | Admitting: *Deleted

## 2021-04-24 MED ORDER — MIDODRINE HCL 5 MG PO TABS: 5.0000 mg | ORAL_TABLET | Freq: Three times a day (TID) | ORAL | 3 refills | Status: DC

## 2021-04-27 ENCOUNTER — Telehealth: Payer: Self-pay | Admitting: *Deleted

## 2021-04-27 NOTE — Telephone Encounter (Signed)
This encounter was created in error - please disregard.

## 2021-04-27 NOTE — Telephone Encounter (Signed)
-----   Message from Reesa Chew, CMA sent at 04/22/2021  3:02 PM EDT ----- Regarding: precert  in lab PSG for nocturnal hypoxemia and CHF

## 2021-04-27 NOTE — Addendum Note (Signed)
Addended by: Reesa Chew on: 04/27/2021 09:56 AM   Modules accepted: Level of Service, SmartSet

## 2021-04-27 NOTE — Telephone Encounter (Signed)
Prior Authorization for NSPG sent to [facility] via web portal. Per web portal no PA is required.

## 2021-04-28 NOTE — Telephone Encounter (Signed)
IPatient is scheduled for lab study on 07-08-21. Patient understands her sleep study will be done at [facility] sleep lab. Patient understands she will receive a sleep packet in a week or so. Patient understands to call if she does not receive the sleep packet in a timely manner. Patient agrees with treatment and thanked me for call.

## 2021-05-14 ENCOUNTER — Other Ambulatory Visit (HOSPITAL_COMMUNITY): Payer: No Typology Code available for payment source

## 2021-05-17 ENCOUNTER — Other Ambulatory Visit (HOSPITAL_COMMUNITY): Payer: Self-pay | Admitting: Adult Health

## 2021-05-26 NOTE — Progress Notes (Signed)
PCP: [facility]  MS morrison FNP  Primary Cardiologist: Dr Gala Romney  HPI: 50 yo female with PMH of alcohol use disorder, tobacco use disorder, and recently diagnosed NICM--> HFrEF.   Admitted with 03/10/21 with worsening bilateral leg edema. Cath with preserved cardiac output and severely reduced EF. MRI concerning for possible myocarditis. Started on GDMT. Counseled stop drinking alcohol.   Today she returns for HF follow up. Last visit she was started on ivabradine 5 mg twice a day.Overall feeling fine. Denies SOB/PND/Orthopnea. Appetite ok. No fever or chills. Weight at home 143-147  pounds. Ran out of farxiga 3 days ago. Has not been taking spironolactone. Taking lasix 2 times a week. Not drinking alcohol. Smoking 1 pack per week.  Working full time at [employer] at [employer].   RHC/LHC 02/2021  Ao = 86/68 (77) LV = 92/25 RA =  4 RV = 36/10 PA = 36/17 (25) PCW = 20 Fick cardiac output/index = 5.4/3.0 PVR = 1.0 WU SVR = 1088 Ao sat = 98% PA sat = 74%, 78% High SVC sat = 83% Assessment: 1. Normal coronary arteries 2. 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 Elevated filling pressures 4. Cardiac output higher than I expected. Suspect peripheral shunting in setting of probable cirrhosis  CMRI 02/2021. Severely dilated left ventricle with EF 11%, diffuse hypokinesis. 2.  Mildly dilated right ventricle with EF 33%. 3. Abnormal aortic valve, looks functionally bicuspid. By regurgitant fraction, there appears to be severe aortic stenosis. Would consider TEE confirmation. 4. Delayed enhancement imaging shows inferoseptal RV insertion site LGE which tends to be nonspecific and related to volume/pressure overload. However, the lateral wall subepicardial LGE is concerning for possible prior myocarditis (less likely sarcoidosis). It is not in a CAD pattern.  Echo  03/12/21 EF < 20% RV normal  ROS: All systems negative except as listed in HPI, PMH and Problem List.  SH:  Social History    Socioeconomic History  . Marital status: Divorced    Spouse name: Not on file  . Number of children: Not on file  . Years of education: Not on file  . Highest education level: Not on file  Occupational History  . Not on file  Tobacco Use  . Smoking status: Current Every Day Smoker  . Smokeless tobacco: Never Used  Substance and Sexual Activity  . Alcohol use: Yes    Comment: occasional  . Drug use: No  . Sexual activity: Not on file  Other Topics Concern  . Not on file  Social History Narrative  . Not on file   Social Determinants of Health   Financial Resource Strain: Not on file  Food Insecurity: Not on file  Transportation Needs: Not on file  Physical Activity: Not on file  Stress: Not on file  Social Connections: Not on file  Intimate Partner Violence: Not on file    FH:  Family History  Problem Relation Age of Onset  . Valvular heart disease Sister     Past Medical History:  Diagnosis Date  . CHF (congestive heart failure) (HCC)     Current Outpatient Medications  Medication Sig Dispense Refill  . buPROPion (WELLBUTRIN SR) 150 MG 12 hr tablet Take 1 tablet (150 mg total) by mouth daily for 3 days, THEN 1 tablet (150 mg total) 2 (two) times daily. 63 tablet 11  . digoxin (LANOXIN) 0.125 MG tablet Take 1 tablet (0.125 mg total) by mouth daily. 30 tablet 2  . furosemide (LASIX) 40 MG tablet TAKE 1 TABLET (  40 MG TOTAL) BY MOUTH DAILY AS NEEDED FOR FLUID OR EDEMA. 15 tablet 0  . ivabradine (CORLANOR) 5 MG TABS tablet Take 1 tablet (5 mg total) by mouth 2 (two) times daily with a meal. 60 tablet 11  . levonorgestrel (LILETTA, 52 MG,) 19.5 MCG/DAY IUD IUD 1 Intra Uterine Device by Intrauterine route.    . midodrine (PROAMATINE) 5 MG tablet Take 1 tablet (5 mg total) by mouth 3 (three) times daily with meals. 90 tablet 3  . Multiple Vitamin (MULTIVITAMIN) capsule Take 1 capsule by mouth daily.     No current facility-administered medications for this encounter.     Vitals:   05/27/21 0907  BP: 118/78  Pulse: 87  SpO2: 98%  Weight: 67.5 kg (148 lb 12.8 oz)   Wt Readings from Last 3 Encounters:  05/27/21 67.5 kg (148 lb 12.8 oz)  03/30/21 66.4 kg (146 lb 6.4 oz)  03/16/21 66.5 kg (146 lb 8 oz)    PHYSICAL EXAM: General:  Well appearing. No resp difficulty HEENT: normal Neck: supple. no JVD. Carotids 2+ bilat; no bruits. No lymphadenopathy or thryomegaly appreciated. Cor: PMI nondisplaced. Regular rate & rhythm. No rubs, gallops or murmurs. Lungs: clear Abdomen: soft, nontender, nondistended. No hepatosplenomegaly. No bruits or masses. Good bowel sounds. Extremities: no cyanosis, clubbing, rash, edema Neuro: alert & orientedx3, cranial nerves grossly intact. moves all 4 extremities w/o difficulty. Affect pleasant  EKG: SR 81 bpm   ASSESSMENT & PLAN: 1. Chronic systolic CHF, Biventricular CHF - ECHO 03/12/21 EF 15% severe RV dysfunction - Cath 03/13/21 EF 15% No CAD. Elevated filling pressures. High cardiac output.  -03/16/2021 CMRI EF < 20%  Possible myocaritis.  - Suspect ETOH CM. TSH normal, UDS negative - Continue digoxin 0.125 , dig level 0.4  - Restart  spiro 12.5 mg . Check BMET in 7 days.  - BP too low for ARB/ARNI and still on midodrine.  - avoid bb for now with severely low EF and decompensation - Cut back midodrine 2.5 mg twice a day.  - Continue 5 mg ivabradine twice a day. Heart much better on ivabradine.  - Restart Farxiga 10  - Plan to repeat ECHO in August with Dr Gala Romney    2. High cardiac output on cath - RUQ u/s suggestive of cirrhosis - Cut back  midodrine to 2.5 tid   3. Alcohol Use disorder: - Congratulated on cessation !!!.   4. Tobacco Use disorder: - Rarely smoking, encouraged to stop all together.  -Continue wellbutrin 150 mg twice a day to assist with smoking cessation   5. Night Time Hypoxia Sleep study negative for OSA. Has night time hypoxia. Plan for formal sleep study next month to  further assess.   Follow up in 3 weeks with pharmacy and in August with Dr Gala Romney + ECHO.   Aldrick Derrig NP-C  9:13 AM

## 2021-05-27 ENCOUNTER — Ambulatory Visit (HOSPITAL_COMMUNITY)
Admission: RE | Admit: 2021-05-27 | Discharge: 2021-05-27 | Disposition: A | Discharge status: Home / Self Care | Payer: No Typology Code available for payment source | Source: Ambulatory Visit | Attending: Adult Health | Admitting: Adult Health

## 2021-05-27 ENCOUNTER — Encounter (HOSPITAL_COMMUNITY): Payer: Self-pay

## 2021-05-27 ENCOUNTER — Other Ambulatory Visit: Payer: Self-pay

## 2021-05-27 VITALS — BP 118/78 | HR 87 | Wt 148.8 lb

## 2021-05-27 DIAGNOSIS — I5082 Biventricular heart failure: Secondary | ICD-10-CM | POA: Insufficient documentation

## 2021-05-27 DIAGNOSIS — I428 Other cardiomyopathies: Secondary | ICD-10-CM | POA: Insufficient documentation

## 2021-05-27 DIAGNOSIS — I5022 Chronic systolic (congestive) heart failure: Secondary | ICD-10-CM | POA: Diagnosis present

## 2021-05-27 DIAGNOSIS — Z79899 Other long term (current) drug therapy: Secondary | ICD-10-CM | POA: Diagnosis not present

## 2021-05-27 DIAGNOSIS — Z720 Tobacco use: Secondary | ICD-10-CM | POA: Diagnosis not present

## 2021-05-27 DIAGNOSIS — F1010 Alcohol abuse, uncomplicated: Secondary | ICD-10-CM

## 2021-05-27 DIAGNOSIS — G4734 Idiopathic sleep related nonobstructive alveolar hypoventilation: Secondary | ICD-10-CM

## 2021-05-27 DIAGNOSIS — F17210 Nicotine dependence, cigarettes, uncomplicated: Secondary | ICD-10-CM | POA: Diagnosis not present

## 2021-05-27 DIAGNOSIS — R0902 Hypoxemia: Secondary | ICD-10-CM | POA: Insufficient documentation

## 2021-05-27 DIAGNOSIS — F1021 Alcohol dependence, in remission: Secondary | ICD-10-CM | POA: Insufficient documentation

## 2021-05-27 MED ORDER — DAPAGLIFLOZIN PROPANEDIOL 10 MG PO TABS: 10.0000 mg | ORAL_TABLET | Freq: Every day | ORAL | 6 refills | Status: DC

## 2021-05-27 MED ORDER — MIDODRINE HCL 5 MG PO TABS: 2.5000 mg | ORAL_TABLET | Freq: Two times a day (BID) | ORAL | 3 refills | Status: DC

## 2021-05-27 MED ORDER — SPIRONOLACTONE 25 MG PO TABS: 12.5000 mg | ORAL_TABLET | Freq: Every day | ORAL | 3 refills | Status: DC

## 2021-05-27 NOTE — Patient Instructions (Signed)
RESTART Pamela Bryan 10mg  (1 tab) daily  START Spironolactone 12.5mg  (1/2 tab) daily  DECREASE Midodrine to 2.5mg  (1/2 tab) twice a day  Labs in 1 week We will only contact you if something comes back abnormal or we need to make some changes. Otherwise no news is good news!  Your physician recommends that you schedule a follow-up appointment in: please keep next scheduled appointment.   Please call office at [phone] option 2 if you have any questions or concerns.   At the [facility], you and your health needs are our priority. As part of our continuing mission to provide you with exceptional heart care, we have created designated Provider Care Teams. These Care Teams include your primary Cardiologist (physician) and Advanced Practice Providers (APPs- Physician Assistants and Nurse Practitioners) who all work together to provide you with the care you need, when you need it.   You may see any of the following providers on your designated Care Team at your next follow up: [phone] Dr Marland Kitchen . Dr Arvilla Meres . Dr Marca Ancona . Thornell Mule, NP . Tonye Becket, PA . Robbie Lis [place],NP . Shanda Bumps, PharmD   Please be sure to bring in all your medications bottles to every appointment.

## 2021-06-03 ENCOUNTER — Other Ambulatory Visit: Payer: Self-pay

## 2021-06-03 ENCOUNTER — Ambulatory Visit (HOSPITAL_COMMUNITY)
Admission: RE | Admit: 2021-06-03 | Discharge: 2021-06-03 | Disposition: A | Discharge status: Home / Self Care | Payer: No Typology Code available for payment source | Source: Ambulatory Visit | Attending: Cardiology | Admitting: Cardiology

## 2021-06-03 DIAGNOSIS — I5022 Chronic systolic (congestive) heart failure: Secondary | ICD-10-CM | POA: Diagnosis present

## 2021-06-03 LAB — BASIC METABOLIC PANEL
Anion gap: 9 (ref 5–15)
BUN: 22 mg/dL — ABNORMAL HIGH (ref 6–20)
CO2: 26 mmol/L (ref 22–32)
Calcium: 9.2 mg/dL (ref 8.9–10.3)
Chloride: 102 mmol/L (ref 98–111)
Creatinine, Ser: 1.18 mg/dL — ABNORMAL HIGH (ref 0.44–1.00)
GFR, Estimated: 57 mL/min — ABNORMAL LOW (ref >60)
Glucose, Bld: 110 mg/dL — ABNORMAL HIGH (ref 70–99)
Potassium: 4.3 mmol/L (ref 3.5–5.1)
Sodium: 137 mmol/L (ref 135–145)

## 2021-06-18 ENCOUNTER — Other Ambulatory Visit (HOSPITAL_COMMUNITY): Payer: No Typology Code available for payment source

## 2021-06-27 ENCOUNTER — Other Ambulatory Visit (HOSPITAL_COMMUNITY): Payer: Self-pay | Admitting: Adult Health

## 2021-07-08 ENCOUNTER — Other Ambulatory Visit: Payer: Self-pay

## 2021-07-08 ENCOUNTER — Other Ambulatory Visit (HOSPITAL_COMMUNITY): Payer: No Typology Code available for payment source

## 2021-07-08 ENCOUNTER — Ambulatory Visit (HOSPITAL_BASED_OUTPATIENT_CLINIC_OR_DEPARTMENT_OTHER): Payer: No Typology Code available for payment source | Attending: Cardiology | Admitting: Cardiology

## 2021-07-08 ENCOUNTER — Encounter (HOSPITAL_COMMUNITY): Payer: No Typology Code available for payment source | Admitting: Internal Medicine

## 2021-07-08 DIAGNOSIS — R0902 Hypoxemia: Secondary | ICD-10-CM | POA: Insufficient documentation

## 2021-07-08 DIAGNOSIS — G4736 Sleep related hypoventilation in conditions classified elsewhere: Secondary | ICD-10-CM | POA: Diagnosis not present

## 2021-07-08 DIAGNOSIS — I5022 Chronic systolic (congestive) heart failure: Secondary | ICD-10-CM

## 2021-07-08 DIAGNOSIS — I509 Heart failure, unspecified: Secondary | ICD-10-CM | POA: Diagnosis present

## 2021-07-08 DIAGNOSIS — G4734 Idiopathic sleep related nonobstructive alveolar hypoventilation: Secondary | ICD-10-CM

## 2021-07-09 ENCOUNTER — Telehealth: Payer: Self-pay | Admitting: *Deleted

## 2021-07-09 NOTE — Procedures (Signed)
   Patient Name: Pamela Bryan, Pamela Bryan Date:07/08/2021 Gender: Female D.O.B: October 30, 1971 Age (years): 6 Referring Provider: Armanda Magic MD, ABSM Height (inches): 67 Interpreting Physician: Armanda Magic MD, ABSM Weight (lbs): 148 RPSGT: Shelah Lewandowsky BMI: 23 MRN: [id number] Neck Size: 14.00  CLINICAL INFORMATION Sleep Study Type: NPSG  Indication for sleep study: Congestive Heart Failure, Snoring, Witnesses Apnea / Gasping During Sleep  Epworth Sleepiness Score: 3  Most recent polysomnogram dated 04/08/2021 revealed an AHI of 4.7/h.  SLEEP STUDY TECHNIQUE As per the AASM Manual for the Scoring of Sleep and Associated Events v2.3 (April 2016) with a hypopnea requiring 4% desaturations.  The channels recorded and monitored were frontal, central and occipital EEG, electrooculogram (EOG), submentalis EMG (chin), nasal and oral airflow, thoracic and abdominal wall motion, anterior tibialis EMG, snore microphone, electrocardiogram, and pulse oximetry.  MEDICATIONS Medications self-administered by patient taken the night of the study : N/A  SLEEP ARCHITECTURE The study was initiated at 10:52:19 PM and ended at 5:26:31 AM.  Sleep onset time was 5.4 minutes and the sleep efficiency was 88.2%. The total sleep time was 347.8 minutes.  Stage REM latency was 65.5 minutes.  The patient spent 9.9% of the night in stage N1 sleep, 67.6% in stage N2 sleep, 0.0%% in stage N3 and 22.6% in REM.  Alpha intrusion was absent.  Supine sleep was 53.71%.  RESPIRATORY PARAMETERS The overall apnea/hypopnea index (AHI) was 4.1 per hour. There were 0 total apneas, including 0 obstructive, 0 central and 0 mixed apneas. There were 24 hypopneas and 34 RERAs.  The AHI during Stage REM sleep was 16.8 per hour.  AHI while supine was 7.1 per hour.  The mean oxygen saturation was 93.6%. The minimum SpO2 during sleep was 85.0%.  moderate snoring was noted during this study.  CARDIAC DATA The 2 lead  EKG demonstrated sinus rhythm. The mean heart rate was 75.0 beats per minute. Other EKG findings include: PVCs.  LEG MOVEMENT DATA The total PLMS were 0 with a resulting PLMS index of 0.0. Associated arousal with leg movement index was 0.0 .  IMPRESSIONS - No significant obstructive sleep apnea occurred during this study (AHI = 4.1/h). - Mild oxygen desaturation was noted during this study (Min O2 = 85.0%). - The patient snored with moderate snoring volume. - EKG findings include PVCs. - Clinically significant periodic limb movements did not occur during sleep. No significant associated arousals.  DIAGNOSIS - Normal Study  RECOMMENDATIONS - Avoid alcohol, sedatives and other CNS depressants that may worsen sleep apnea and disrupt normal sleep architecture. - Sleep hygiene should be reviewed to assess factors that may improve sleep quality. - Weight management and regular exercise should be initiated or continued if appropriate.  [Electronically signed] 07/09/2021 08:48 AM  Armanda Magic MD, ABSM Diplomate, American Board of Sleep Medicine

## 2021-07-09 NOTE — Telephone Encounter (Signed)
The patient has been notified of the result and verbalized understanding.  All questions (if any) were answered. Latrelle Dodrill, CMA 07/09/2021 2:13 PM

## 2021-07-09 NOTE — Telephone Encounter (Signed)
-----   Message from Quintella Reichert, MD sent at 07/09/2021  8:51 AM EDT ----- Please let patient know that sleep study showed no significant sleep apnea.

## 2021-07-30 ENCOUNTER — Ambulatory Visit (HOSPITAL_COMMUNITY)
Admission: RE | Admit: 2021-07-30 | Discharge: 2021-07-30 | Disposition: A | Discharge status: Home / Self Care | Payer: No Typology Code available for payment source | Source: Ambulatory Visit | Attending: Adult Health | Admitting: Adult Health

## 2021-07-30 ENCOUNTER — Other Ambulatory Visit: Payer: Self-pay

## 2021-07-30 ENCOUNTER — Ambulatory Visit (HOSPITAL_BASED_OUTPATIENT_CLINIC_OR_DEPARTMENT_OTHER)
Admission: RE | Admit: 2021-07-30 | Discharge: 2021-07-30 | Disposition: A | Discharge status: Home / Self Care | Payer: No Typology Code available for payment source | Source: Ambulatory Visit | Attending: Internal Medicine | Admitting: Internal Medicine

## 2021-07-30 ENCOUNTER — Encounter (HOSPITAL_COMMUNITY): Payer: Self-pay | Admitting: Internal Medicine

## 2021-07-30 VITALS — BP 104/60 | HR 93 | Wt 149.6 lb

## 2021-07-30 DIAGNOSIS — Z79899 Other long term (current) drug therapy: Secondary | ICD-10-CM | POA: Diagnosis not present

## 2021-07-30 DIAGNOSIS — Z793 Long term (current) use of hormonal contraceptives: Secondary | ICD-10-CM | POA: Insufficient documentation

## 2021-07-30 DIAGNOSIS — I5022 Chronic systolic (congestive) heart failure: Secondary | ICD-10-CM

## 2021-07-30 DIAGNOSIS — F1010 Alcohol abuse, uncomplicated: Secondary | ICD-10-CM

## 2021-07-30 DIAGNOSIS — Q2381 Bicuspid aortic valve: Secondary | ICD-10-CM

## 2021-07-30 DIAGNOSIS — Z7984 Long term (current) use of oral hypoglycemic drugs: Secondary | ICD-10-CM | POA: Insufficient documentation

## 2021-07-30 DIAGNOSIS — R0902 Hypoxemia: Secondary | ICD-10-CM | POA: Insufficient documentation

## 2021-07-30 DIAGNOSIS — Z720 Tobacco use: Secondary | ICD-10-CM

## 2021-07-30 DIAGNOSIS — F17210 Nicotine dependence, cigarettes, uncomplicated: Secondary | ICD-10-CM | POA: Diagnosis not present

## 2021-07-30 DIAGNOSIS — I428 Other cardiomyopathies: Secondary | ICD-10-CM | POA: Diagnosis not present

## 2021-07-30 DIAGNOSIS — Q231 Congenital insufficiency of aortic valve: Secondary | ICD-10-CM

## 2021-07-30 DIAGNOSIS — I5082 Biventricular heart failure: Secondary | ICD-10-CM | POA: Insufficient documentation

## 2021-07-30 DIAGNOSIS — I080 Rheumatic disorders of both mitral and aortic valves: Secondary | ICD-10-CM | POA: Insufficient documentation

## 2021-07-30 LAB — CBC
HCT: 47 % — ABNORMAL HIGH (ref 36.0–46.0)
Hemoglobin: 15.7 g/dL — ABNORMAL HIGH (ref 12.0–15.0)
MCH: 33.3 pg (ref 26.0–34.0)
MCHC: 33.4 g/dL (ref 30.0–36.0)
MCV: 99.6 fL (ref 80.0–100.0)
Platelets: 262 10*3/uL (ref 150–400)
RBC: 4.72 MIL/uL (ref 3.87–5.11)
RDW: 13.7 % (ref 11.5–15.5)
WBC: 9.7 10*3/uL (ref 4.0–10.5)
nRBC: 0 % (ref 0.0–0.2)

## 2021-07-30 LAB — ECHOCARDIOGRAM COMPLETE
AR max vel: 1.47 cm2
AV Area VTI: 1.63 cm2
AV Area mean vel: 1.32 cm2
AV Mean grad: 14 mmHg
AV Peak grad: 22.1 mmHg
Ao pk vel: 2.35 m/s
P 1/2 time: 180 msec
S' Lateral: 6.7 cm
Single Plane A2C EF: 20.9 %

## 2021-07-30 LAB — BASIC METABOLIC PANEL
Anion gap: 13 (ref 5–15)
BUN: 12 mg/dL (ref 6–20)
CO2: 22 mmol/L (ref 22–32)
Calcium: 9.4 mg/dL (ref 8.9–10.3)
Chloride: 103 mmol/L (ref 98–111)
Creatinine, Ser: 0.91 mg/dL (ref 0.44–1.00)
GFR, Estimated: >60 mL/min (ref >60)
Glucose, Bld: 78 mg/dL (ref 70–99)
Potassium: 3.4 mmol/L — ABNORMAL LOW (ref 3.5–5.1)
Sodium: 138 mmol/L (ref 135–145)

## 2021-07-30 LAB — BRAIN NATRIURETIC PEPTIDE: B Natriuretic Peptide: 677.2 pg/mL — ABNORMAL HIGH (ref 0.0–100.0)

## 2021-07-30 MED ORDER — IVABRADINE HCL 7.5 MG PO TABS
7.5000 mg | ORAL_TABLET | Freq: Two times a day (BID) | ORAL | 3 refills | Status: DC
Start: 2021-07-30 — End: 2021-11-11

## 2021-07-30 MED ORDER — LOSARTAN POTASSIUM 25 MG PO TABS: 25.0000 mg | ORAL_TABLET | Freq: Every day | ORAL | 3 refills | Status: DC

## 2021-07-30 NOTE — Addendum Note (Signed)
Encounter addended by: Chinita Pester, CMA on: 07/30/2021 3:02 PM  Actions taken: Visit diagnoses modified, Diagnosis association updated, Order list changed, Pharmacy for encounter modified, Charge Capture section accepted, Pend clinical note, Clinical Note Signed

## 2021-07-30 NOTE — Progress Notes (Signed)
  Echocardiogram 2D Echocardiogram has been performed.  Delcie Roch 07/30/2021, 2:05 PM

## 2021-07-30 NOTE — Addendum Note (Signed)
Encounter addended by: Chinita Pester, CMA on: 07/30/2021 3:05 PM  Actions taken: Clinical Note Signed

## 2021-07-30 NOTE — Addendum Note (Signed)
Encounter addended by: Chinita Pester, CMA on: 07/30/2021 3:03 PM  Actions taken: Order list changed, Pharmacy for encounter modified

## 2021-07-30 NOTE — Progress Notes (Signed)
ADVANCED HF CLINIC NOTE   PCP: [facility]  MS morrison FNP  Primary Cardiologist: Dr Gala Romney  HPI: 50 yo female with PMH of alcohol use disorder, tobacco use disorder and systolic HF due to NICM   Admitted with 3/22 with acute systolic HF. EF 15-20% Cath with preserved cardiac output and severely reduced EF. MRI EF 11% concerning for possible myocarditis. Started on GDMT. Counseled stop drinking alcohol.   Today she returns for HF follow up. Says she feels good. Works as Production designer, theatre/television/film at [employer]. Very active. Gets 22K steps per day. No SOB, orthopnea or PND. Compliant with HF meds. Not drinking much ETOH ("only 3 times since March")  Echo today  EF 20% severely dilated RV normal AoV functionally bicuspid mild AS at least moderate AI Personally reviewed   Cardiac studies:  RHC/LHC 02/2021  Ao = 86/68 (77) LV = 92/25 RA =  4 RV = 36/10 PA = 36/17 (25) PCW = 20 Fick cardiac output/index = 5.4/3.0 PVR = 1.0 WU SVR = 1088 Ao sat = 98% PA sat = 74%, 78% High SVC sat = 83%  Assessment: 1. Normal coronary arteries 2. 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 Elevated filling pressures 4. Cardiac output higher than I expected. Suspect peripheral shunting in setting of probable cirrhosis  CMRI 02/2021. Severely dilated left ventricle with EF 11%, diffuse hypokinesis.  2.  Mildly dilated right ventricle with EF 33%.  3. Abnormal aortic valve, looks functionally bicuspid. By regurgitant fraction, there appears to be severe aortic stenosis. Would consider TEE confirmation.  4. Delayed enhancement imaging shows inferoseptal RV insertion site LGE which tends to be nonspecific and related to volume/pressure overload. However, the lateral wall subepicardial LGE is concerning for possible prior myocarditis (less likely sarcoidosis). It is not in a CAD pattern.  Echo  03/12/21 EF < 20% RV normal  ROS: All systems negative except as listed in HPI, PMH and Problem List.  SH:  Social History    Socioeconomic History   Marital status: Divorced    Spouse name: Not on file   Number of children: Not on file   Years of education: Not on file   Highest education level: Not on file  Occupational History   Not on file  Tobacco Use   Smoking status: Every Day   Smokeless tobacco: Never  Substance and Sexual Activity   Alcohol use: Yes    Comment: occasional   Drug use: No   Sexual activity: Not on file  Other Topics Concern   Not on file  Social History Narrative   Not on file   Social Determinants of Health   Financial Resource Strain: Not on file  Food Insecurity: Not on file  Transportation Needs: Not on file  Physical Activity: Not on file  Stress: Not on file  Social Connections: Not on file  Intimate Partner Violence: Not on file    FH:  Family History  Problem Relation Age of Onset   Valvular heart disease Sister     Past Medical History:  Diagnosis Date   CHF (congestive heart failure) (HCC)     Current Outpatient Medications  Medication Sig Dispense Refill   buPROPion (WELLBUTRIN XL) 150 MG 24 hr tablet Take 150 mg by mouth 2 (two) times daily.     dapagliflozin propanediol (FARXIGA) 10 MG TABS tablet Take 1 tablet (10 mg total) by mouth daily. 30 tablet 6   digoxin (LANOXIN) 0.125 MG tablet Take 1 tablet (0.125 mg total) by  mouth daily. 30 tablet 2   furosemide (LASIX) 40 MG tablet TAKE 1 TABLET (40 MG TOTAL) BY MOUTH DAILY AS NEEDED FOR FLUID OR EDEMA. 15 tablet 3   ivabradine (CORLANOR) 5 MG TABS tablet Take 1 tablet (5 mg total) by mouth 2 (two) times daily with a meal. 60 tablet 11   levonorgestrel (LILETTA, 52 MG,) 19.5 MCG/DAY IUD IUD 1 Intra Uterine Device by Intrauterine route.     midodrine (PROAMATINE) 5 MG tablet Take 0.5 tablets (2.5 mg total) by mouth 2 (two) times daily with a meal. 45 tablet 3   Multiple Vitamin (MULTIVITAMIN) capsule Take 1 capsule by mouth daily.     spironolactone (ALDACTONE) 25 MG tablet Take 0.5 tablets (12.5 mg  total) by mouth daily. 45 tablet 3   No current facility-administered medications for this encounter.    Vitals:   07/30/21 1410  BP: 104/60  Pulse: 93  SpO2: 97%  Weight: 67.9 kg (149 lb 9.6 oz)    Wt Readings from Last 3 Encounters:  07/30/21 67.9 kg (149 lb 9.6 oz)  07/08/21 67.1 kg (148 lb)  05/27/21 67.5 kg (148 lb 12.8 oz)    PHYSICAL EXAM: General:  Well appearing. No resp difficulty HEENT: normal Neck: supple. no JVD. Carotids 2+ bilat; no bruits. No lymphadenopathy or thryomegaly appreciated. Cor: PMI nondisplaced. Regular rate & rhythm. No rubs, gallops or murmurs. Lungs: clear Abdomen: soft, nontender, nondistended. No hepatosplenomegaly. No bruits or masses. Good bowel sounds. Extremities: no cyanosis, clubbing, rash, edema Neuro: alert & orientedx3, cranial nerves grossly intact. moves all 4 extremities w/o difficulty. Affect pleasant   ASSESSMENT & PLAN: 1. Chronic systolic CHF, Biventricular CHF - ECHO 03/12/21 EF 15% severe RV dysfunction - Cath 03/13/21 EF 15% No CAD. Elevated filling pressures. High cardiac output.  -03/16/2021 CMRI EF < 20%  Possible myocaritis.  - Possible ETOH CM. TSH normal, UDS negative - Echo today 07/30/21  EF 20% severely dilated RV normal AoV functionally bicuspid mild AS at least moderate AI Personally reviewed - Remains NYHA I despite severe LV dysfunction. Volume status ok  - Continue digoxin 0.125 - Continue spiro 12.5mg  daily - Continue Farxiga 10 daily - Continue Colanor 5 bid  - Start losartan 25 mg po qhs - Add b-blocker at next visit.   - Cut back midodrine 2.5 mg twice a day.  - In looking at echo she appears to have bicuspid AoV and possibly severe AI which may be leading to LV dysfunction. Will proceed with TEE - See NP/PA in 1 month  2. Probable bicuspid Aov - (her sister has it too) - This was underappreciated before. Suspect may be cause of cardiomyopathy if AI is severe - As above plan TEE   3. High cardiac  output on cath - RUQ u/s suggestive of cirrhosis - Continue midodrine 2.5 tid    4. Alcohol Use disorder: - Congratulated on cessation !!!.    5. Tobacco Use disorder: - Vapes on rare occasion  6. Night Time Hypoxia - Sleep study negative for OSA. Has night time hypoxia. - Formal sleep study was OK    Arvilla Meres MD 2:38 PM

## 2021-07-30 NOTE — Patient Instructions (Addendum)
Labs done today. We will contact you only if your labs are abnormal.  START Losartan 25mg  (1 tablet) by mouth daily at bedtime.  INCREASE Corlanor to 7.5mg  (1 tablet) by mouth 2 times daily.   No other medication changes were made. Please continue all current medications as prescribed.  Your physician recommends that you schedule a follow-up appointment in: 1 month with our [facility] here in our office.  If you have any questions or concerns before your next appointment please send a message through [place] or call our office at [phone].    TO LEAVE A MESSAGE FOR THE NURSE SELECT OPTION 2, PLEASE LEAVE A MESSAGE INCLUDING: YOUR NAME DATE OF BIRTH CALL BACK NUMBER REASON FOR CALL**this is important as we prioritize the call backs  YOU WILL RECEIVE A CALL BACK THE SAME DAY AS LONG AS YOU CALL BEFORE 4:00 PM   Do the following things EVERYDAY: Weigh yourself in the morning before breakfast. Write it down and keep it in a log. Take your medicines as prescribed Eat low salt foods--Limit salt (sodium) to 2000 mg per day.  Stay as active as you can everyday Limit all fluids for the day to less than 2 liters   At the [facility], you and your health needs are our priority. As part of our continuing mission to provide you with exceptional heart care, we have created designated Provider Care Teams. These Care Teams include your primary Cardiologist (physician) and Advanced Practice Providers (APPs- Physician Assistants and Nurse Practitioners) who all work together to provide you with the care you need, when you need it.   You may see any of the following providers on your designated Care Team at your next follow up: Dr [phone] Dr Arvilla Meres, NP Carron Curie, Robbie Lis [place], PharmD   Please be sure to bring in all your medications bottles to every appointment.   You are scheduled for a TEE on Friday August 12th 2022 with Dr. [id number].  Please arrive at the [facility] (Main Entrance A) at [facility]: [address] [place], [place] at 6:30am. (1 hour prior to procedure unless lab work is needed; if lab work is needed arrive 1.5 hours ahead)  DIET: Nothing to eat or drink after midnight except a sip of water with medications (see medication instructions below)  FYI: For your safety, and to allow [id number] to monitor your vital signs accurately during the surgery/procedure we request that   if you have artificial nails, gel coating, SNS etc. Please have those removed prior to your surgery/procedure. Not having the nail coverings /polish removed may result in cancellation or delay of your surgery/procedure.  Medication Instructions: Hold your Lasix the day of the procedure  You must have a responsible person to drive you home and stay in the waiting area during your procedure. Failure to do so could result in cancellation.  Bring your insurance cards.  *Special Note: Every effort is made to have your procedure done on time. Occasionally there are emergencies that occur at the hospital that may cause delays. Please be patient if a delay does occur.

## 2021-07-31 ENCOUNTER — Other Ambulatory Visit (HOSPITAL_COMMUNITY): Payer: Self-pay

## 2021-08-03 ENCOUNTER — Telehealth (HOSPITAL_COMMUNITY): Payer: Self-pay | Admitting: *Deleted

## 2021-08-03 NOTE — Telephone Encounter (Signed)
TEE Auth faxed to [employer] [phone]

## 2021-08-06 ENCOUNTER — Ambulatory Visit (HOSPITAL_COMMUNITY): Payer: No Typology Code available for payment source | Admitting: Anesthesiology

## 2021-08-06 NOTE — Anesthesia Preprocedure Evaluation (Deleted)
Anesthesia Evaluation    Reviewed: Allergy & Precautions, H&P , Patient's Chart, lab work & pertinent test results  Airway        Dental   Pulmonary neg pulmonary ROS, Current Smoker,           Cardiovascular Exercise Tolerance: Good +CHF  negative cardio ROS       Neuro/Psych negative neurological ROS  negative psych ROS   GI/Hepatic negative GI ROS, Neg liver ROS,   Endo/Other  negative endocrine ROS  Renal/GU negative Renal ROS  negative genitourinary   Musculoskeletal   Abdominal   Peds  Hematology negative hematology ROS (+)   Anesthesia Other Findings   Reproductive/Obstetrics negative OB ROS                             Anesthesia Physical Anesthesia Plan  ASA: 4  Anesthesia Plan: MAC   Post-op Pain Management:    Induction: Intravenous  PONV Risk Score and Plan: 1  Airway Management Planned: Nasal Cannula  Additional Equipment:   Intra-op Plan:   Post-operative Plan:   Informed Consent: I have reviewed the patients History and Physical, chart, labs and discussed the procedure including the risks, benefits and alternatives for the proposed anesthesia with the patient or authorized representative who has indicated his/her understanding and acceptance.       Plan Discussed with:   Anesthesia Plan Comments: (Cancelled procedure. Pt drove herself to the hospital and has no one to giver her a ride back home.)       Anesthesia Quick Evaluation

## 2021-08-07 ENCOUNTER — Encounter (HOSPITAL_COMMUNITY)
Admission: RE | Disposition: A | Discharge status: Home / Self Care | Payer: Self-pay | Source: Home / Self Care | Attending: Internal Medicine

## 2021-08-07 ENCOUNTER — Ambulatory Visit (HOSPITAL_COMMUNITY)
Admission: RE | Admit: 2021-08-07 | Discharge: 2021-08-07 | Disposition: A | Discharge status: Home / Self Care | Payer: No Typology Code available for payment source | Attending: Internal Medicine | Admitting: Internal Medicine

## 2021-08-07 ENCOUNTER — Encounter (HOSPITAL_COMMUNITY): Payer: Self-pay | Admitting: Internal Medicine

## 2021-08-07 DIAGNOSIS — I509 Heart failure, unspecified: Secondary | ICD-10-CM | POA: Insufficient documentation

## 2021-08-07 DIAGNOSIS — Z5309 Procedure and treatment not carried out because of other contraindication: Secondary | ICD-10-CM | POA: Diagnosis not present

## 2021-08-07 SURGERY — CANCELLED PROCEDURE
Anesthesia: Monitor Anesthesia Care

## 2021-08-07 NOTE — Progress Notes (Signed)
Patient was unaware she was to be sedated for the procedure and had no one that could take her home, as well as planning to go to work after. Advised would need to be NPO with a driver, patient will call Dr Bensimhon's office to reschedule procedure.

## 2021-08-09 NOTE — H&P (Signed)
  Patient cancelled procedure due to not having anyone to stay with her at home after the procedure. Will reschedule.

## 2021-08-10 ENCOUNTER — Encounter (HOSPITAL_COMMUNITY): Payer: Self-pay | Admitting: *Deleted

## 2021-08-25 ENCOUNTER — Telehealth (HOSPITAL_COMMUNITY): Payer: Self-pay | Admitting: Internal Medicine

## 2021-08-25 NOTE — Telephone Encounter (Signed)
Pt called to r/s surgery that was canceled on 08/12, please advise

## 2021-09-01 NOTE — Progress Notes (Signed)
ADVANCED HF CLINIC NOTE   PCP: [facility]  MS morrison FNP  HF Cardiologist: Dr Gala Romney  HPI: Pamela Bryan is a 50 y.o. female with PMH of alcohol use disorder, tobacco use disorder and systolic HF due to NICM.   Admitted with 3/22 with acute systolic HF. EF 15-20% Cath with preserved cardiac output and severely reduced EF. MRI EF 11% concerning for possible myocarditis. Started on GDMT. Counseled stop drinking alcohol.   Echo 8/22  EF 20% severely dilated RV normal AoV functionally bicuspid mild AS at least moderate AI (reviewed by Dr. Gala Romney).   Losartan added at her follow up and she was scheduled for TEE to evaluate AV (? Bicuspid & severe AI) but cancelled as she did not have anyone to take her to procedure.  Today she returns for HF follow up. In process of moving into townhouse, very busy with work. Overall feeling fine. No SOB at work. Denies CP, dizziness, edema, or PND/Orthopnea. Weight at home 144-146 pounds. Took lasix last week. Has been out of midodrine & spironolactone for past couple days. Had glass of wine the other night, otherwise not much. Her sister has bicuspid Aortic Valve and three of her sister's children do as well. Her tubes are tied and she has IUD. She vapes but uses 0% nicotine cartridges.  Cardiac studies:  RHC/LHC 02/2021  Ao = 86/68 (77) LV = 92/25 RA =  4 RV = 36/10 PA = 36/17 (25) PCW = 20 Fick cardiac output/index = 5.4/3.0 PVR = 1.0 WU SVR = 1088 Ao sat = 98% PA sat = 74%, 78% High SVC sat = 83%  Assessment: 1. Normal coronary arteries 2. 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 Elevated filling pressures 4. Cardiac output higher than I expected. Suspect peripheral shunting in setting of probable cirrhosis  cMRI (02/2021):Marland Kitchen Severely dilated left ventricle with EF 11%, diffuse hypokinesis.  2.  Mildly dilated right ventricle with EF 33%.  3. Abnormal aortic valve, looks functionally bicuspid. By regurgitant fraction, there appears to be severe aortic  stenosis. Would consider TEE confirmation.  4. Delayed enhancement imaging shows inferoseptal RV insertion site LGE which tends to be nonspecific and related to volume/pressure overload. However, the lateral wall subepicardial LGE is concerning for possible prior myocarditis (less likely sarcoidosis). It is not in a CAD pattern.  Echo  - 03/12/21 EF < 20% RV normal  ROS: All systems negative except as listed in HPI, PMH and Problem List.  SH:  Social History   Socioeconomic History   Marital status: Divorced    Spouse name: Not on file   Number of children: Not on file   Years of education: Not on file   Highest education level: Not on file  Occupational History   Not on file  Tobacco Use   Smoking status: Every Day   Smokeless tobacco: Never  Substance and Sexual Activity   Alcohol use: Yes    Comment: occasional   Drug use: No   Sexual activity: Not on file  Other Topics Concern   Not on file  Social History Narrative   Not on file   Social Determinants of Health   Financial Resource Strain: Not on file  Food Insecurity: Not on file  Transportation Needs: Not on file  Physical Activity: Not on file  Stress: Not on file  Social Connections: Not on file  Intimate Partner Violence: Not on file   FH:  Family History  Problem Relation Age of Onset   Valvular  heart disease Sister    Past Medical History:  Diagnosis Date   CHF (congestive heart failure) (HCC)    Current Outpatient Medications  Medication Sig Dispense Refill   buPROPion (WELLBUTRIN XL) 150 MG 24 hr tablet Take 150 mg by mouth 2 (two) times daily.     dapagliflozin propanediol (FARXIGA) 10 MG TABS tablet Take 1 tablet (10 mg total) by mouth daily. 30 tablet 6   digoxin (LANOXIN) 0.125 MG tablet Take 1 tablet (0.125 mg total) by mouth daily. 30 tablet 2   furosemide (LASIX) 40 MG tablet TAKE 1 TABLET (40 MG TOTAL) BY MOUTH DAILY AS NEEDED FOR FLUID OR EDEMA. 15 tablet 3   ivabradine (CORLANOR) 7.5  MG TABS tablet Take 1 tablet (7.5 mg total) by mouth 2 (two) times daily with a meal. 90 tablet 3   levonorgestrel (LILETTA, 52 MG,) 19.5 MCG/DAY IUD IUD 1 Intra Uterine Device by Intrauterine route.     losartan (COZAAR) 25 MG tablet Take 1 tablet (25 mg total) by mouth at bedtime. 90 tablet 3   midodrine (PROAMATINE) 5 MG tablet Take 0.5 tablets (2.5 mg total) by mouth 2 (two) times daily with a meal. 45 tablet 3   Multiple Vitamin (MULTIVITAMIN) capsule Take 1 capsule by mouth daily.     spironolactone (ALDACTONE) 25 MG tablet Take 0.5 tablets (12.5 mg total) by mouth daily. 45 tablet 3   No current facility-administered medications for this encounter.   BP 104/78   Pulse 90   Wt 68.9 kg   SpO2 99%   BMI 23.81 kg/m   Wt Readings from Last 3 Encounters:  09/02/21 68.9 kg  07/30/21 67.9 kg  07/08/21 67.1 kg   PHYSICAL EXAM: General:  NAD. No resp difficulty, anxious-appearing. HEENT: Normal Neck: Supple. JVP 6-7. Carotids 2+ bilat; no bruits. No lymphadenopathy or thryomegaly appreciated. Cor: PMI nondisplaced. Regular rate & rhythm. No rubs, gallops or murmurs. Lungs: Clear Abdomen: Soft, nontender, nondistended. No hepatosplenomegaly. No bruits or masses. Good bowel sounds. Extremities: No cyanosis, clubbing, rash, edema Neuro: Alert & oriented x 3, cranial nerves grossly intact. Moves all 4 extremities w/o difficulty. Affect pleasant.  ASSESSMENT & PLAN: 1. Chronic systolic CHF, Biventricular CHF - Echo (03/12/21): EF 15% severe RV dysfunction. - Cath (03/13/21): EF 15% No CAD. Elevated filling pressures. High cardiac output.  - cMRI 03/16/2021  EF < 20%  Possible myocaritis.  - Possible ETOH CM. TSH normal, UDS negative. - Echo (07/30/21):  EF 20% severely dilated RV normal AoV functionally bicuspid mild AS at least moderate AI. - Remains NYHA I despite severe LV dysfunction. Volume status mildly elevated, likely due to being out of spiro for a few days. OK to take lasix dose  today. - Restart spiro 12.5 mg daily. (Will refill today). - Start carvedilol 3.125 mg bid. - Continue digoxin 0.125 mg daily. - Continue Farxiga 10 daily. - Continue Colanor 7.5 mg bid.  - Continue losartan 25 mg po qhs. - Restart midodrine 2.5 mg bid (has been out for a few days). - She appears to have bicuspid AoV and possibly severe AI which may be leading to LV dysfunction. Will arranged for TEE. - BMET and dig level today.  2. Probable bicuspid Aov - (her sister has it too). - This was underappreciated before. Suspect may be cause of cardiomyopathy if AI is severe. - As above plan TEE.   3. High cardiac output on cath - RUQ u/s suggestive of cirrhosis. - Continue midodrine 2.5 mg  bid.   4. Alcohol Use disorder: - Congratulated on cutting back.   5. Tobacco Use disorder: - Vapes daily but has weaned down to 0% nicotine cartridges.  6. Night Time Hypoxia - Sleep study negative for OSA. Has night time hypoxia. - Formal sleep study was OK.   Follow up with PharmD in 3-4 weeks (may be able to stop midodrine) then with APP in 6 weeks.  Jacklynn Ganong FNP 9:55 AM

## 2021-09-01 NOTE — H&P (View-Only) (Signed)
ADVANCED HF CLINIC NOTE   PCP: [facility]  MS morrison FNP  HF Cardiologist: Dr Gala Romney  HPI: Pamela Bryan is a 50 y.o. female with PMH of alcohol use disorder, tobacco use disorder and systolic HF due to NICM.   Admitted with 3/22 with acute systolic HF. EF 15-20% Cath with preserved cardiac output and severely reduced EF. MRI EF 11% concerning for possible myocarditis. Started on GDMT. Counseled stop drinking alcohol.   Echo 8/22  EF 20% severely dilated RV normal AoV functionally bicuspid mild AS at least moderate AI (reviewed by Dr. Gala Romney).   Losartan added at her follow up and she was scheduled for TEE to evaluate AV (? Bicuspid & severe AI) but cancelled as she did not have anyone to take her to procedure.  Today she returns for HF follow up. In process of moving into townhouse, very busy with work. Overall feeling fine. No SOB at work. Denies CP, dizziness, edema, or PND/Orthopnea. Weight at home 144-146 pounds. Took lasix last week. Has been out of midodrine & spironolactone for past couple days. Had glass of wine the other night, otherwise not much. Her sister has bicuspid Aortic Valve and three of her sister's children do as well. Her tubes are tied and she has IUD. She vapes but uses 0% nicotine cartridges.  Cardiac studies:  RHC/LHC 02/2021  Ao = 86/68 (77) LV = 92/25 RA =  4 RV = 36/10 PA = 36/17 (25) PCW = 20 Fick cardiac output/index = 5.4/3.0 PVR = 1.0 WU SVR = 1088 Ao sat = 98% PA sat = 74%, 78% High SVC sat = 83%  Assessment: 1. Normal coronary arteries 2. 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 Elevated filling pressures 4. Cardiac output higher than I expected. Suspect peripheral shunting in setting of probable cirrhosis  cMRI (02/2021):Marland Kitchen Severely dilated left ventricle with EF 11%, diffuse hypokinesis.  2.  Mildly dilated right ventricle with EF 33%.  3. Abnormal aortic valve, looks functionally bicuspid. By regurgitant fraction, there appears to be severe aortic  stenosis. Would consider TEE confirmation.  4. Delayed enhancement imaging shows inferoseptal RV insertion site LGE which tends to be nonspecific and related to volume/pressure overload. However, the lateral wall subepicardial LGE is concerning for possible prior myocarditis (less likely sarcoidosis). It is not in a CAD pattern.  Echo  - 03/12/21 EF < 20% RV normal  ROS: All systems negative except as listed in HPI, PMH and Problem List.  SH:  Social History   Socioeconomic History   Marital status: Divorced    Spouse name: Not on file   Number of children: Not on file   Years of education: Not on file   Highest education level: Not on file  Occupational History   Not on file  Tobacco Use   Smoking status: Every Day   Smokeless tobacco: Never  Substance and Sexual Activity   Alcohol use: Yes    Comment: occasional   Drug use: No   Sexual activity: Not on file  Other Topics Concern   Not on file  Social History Narrative   Not on file   Social Determinants of Health   Financial Resource Strain: Not on file  Food Insecurity: Not on file  Transportation Needs: Not on file  Physical Activity: Not on file  Stress: Not on file  Social Connections: Not on file  Intimate Partner Violence: Not on file   FH:  Family History  Problem Relation Age of Onset   Valvular  heart disease Sister    Past Medical History:  Diagnosis Date   CHF (congestive heart failure) (HCC)    Current Outpatient Medications  Medication Sig Dispense Refill   buPROPion (WELLBUTRIN XL) 150 MG 24 hr tablet Take 150 mg by mouth 2 (two) times daily.     dapagliflozin propanediol (FARXIGA) 10 MG TABS tablet Take 1 tablet (10 mg total) by mouth daily. 30 tablet 6   digoxin (LANOXIN) 0.125 MG tablet Take 1 tablet (0.125 mg total) by mouth daily. 30 tablet 2   furosemide (LASIX) 40 MG tablet TAKE 1 TABLET (40 MG TOTAL) BY MOUTH DAILY AS NEEDED FOR FLUID OR EDEMA. 15 tablet 3   ivabradine (CORLANOR) 7.5  MG TABS tablet Take 1 tablet (7.5 mg total) by mouth 2 (two) times daily with a meal. 90 tablet 3   levonorgestrel (LILETTA, 52 MG,) 19.5 MCG/DAY IUD IUD 1 Intra Uterine Device by Intrauterine route.     losartan (COZAAR) 25 MG tablet Take 1 tablet (25 mg total) by mouth at bedtime. 90 tablet 3   midodrine (PROAMATINE) 5 MG tablet Take 0.5 tablets (2.5 mg total) by mouth 2 (two) times daily with a meal. 45 tablet 3   Multiple Vitamin (MULTIVITAMIN) capsule Take 1 capsule by mouth daily.     spironolactone (ALDACTONE) 25 MG tablet Take 0.5 tablets (12.5 mg total) by mouth daily. 45 tablet 3   No current facility-administered medications for this encounter.   BP 104/78   Pulse 90   Wt 68.9 kg   SpO2 99%   BMI 23.81 kg/m   Wt Readings from Last 3 Encounters:  09/02/21 68.9 kg  07/30/21 67.9 kg  07/08/21 67.1 kg   PHYSICAL EXAM: General:  NAD. No resp difficulty, anxious-appearing. HEENT: Normal Neck: Supple. JVP 6-7. Carotids 2+ bilat; no bruits. No lymphadenopathy or thryomegaly appreciated. Cor: PMI nondisplaced. Regular rate & rhythm. No rubs, gallops or murmurs. Lungs: Clear Abdomen: Soft, nontender, nondistended. No hepatosplenomegaly. No bruits or masses. Good bowel sounds. Extremities: No cyanosis, clubbing, rash, edema Neuro: Alert & oriented x 3, cranial nerves grossly intact. Moves all 4 extremities w/o difficulty. Affect pleasant.  ASSESSMENT & PLAN: 1. Chronic systolic CHF, Biventricular CHF - Echo (03/12/21): EF 15% severe RV dysfunction. - Cath (03/13/21): EF 15% No CAD. Elevated filling pressures. High cardiac output.  - cMRI 03/16/2021  EF < 20%  Possible myocaritis.  - Possible ETOH CM. TSH normal, UDS negative. - Echo (07/30/21):  EF 20% severely dilated RV normal AoV functionally bicuspid mild AS at least moderate AI. - Remains NYHA I despite severe LV dysfunction. Volume status mildly elevated, likely due to being out of spiro for a few days. OK to take lasix dose  today. - Restart spiro 12.5 mg daily. (Will refill today). - Start carvedilol 3.125 mg bid. - Continue digoxin 0.125 mg daily. - Continue Farxiga 10 daily. - Continue Colanor 7.5 mg bid.  - Continue losartan 25 mg po qhs. - Restart midodrine 2.5 mg bid (has been out for a few days). - She appears to have bicuspid AoV and possibly severe AI which may be leading to LV dysfunction. Will arranged for TEE. - BMET and dig level today.  2. Probable bicuspid Aov - (her sister has it too). - This was underappreciated before. Suspect may be cause of cardiomyopathy if AI is severe. - As above plan TEE.   3. High cardiac output on cath - RUQ u/s suggestive of cirrhosis. - Continue midodrine 2.5 mg  bid.   4. Alcohol Use disorder: - Congratulated on cutting back.   5. Tobacco Use disorder: - Vapes daily but has weaned down to 0% nicotine cartridges.  6. Night Time Hypoxia - Sleep study negative for OSA. Has night time hypoxia. - Formal sleep study was OK.   Follow up with PharmD in 3-4 weeks (may be able to stop midodrine) then with APP in 6 weeks.  Jacklynn Ganong FNP 9:55 AM

## 2021-09-02 ENCOUNTER — Ambulatory Visit (HOSPITAL_COMMUNITY)
Admission: RE | Admit: 2021-09-02 | Discharge: 2021-09-02 | Disposition: A | Discharge status: Home / Self Care | Payer: No Typology Code available for payment source | Source: Ambulatory Visit | Attending: Family Medicine | Admitting: Family Medicine

## 2021-09-02 ENCOUNTER — Other Ambulatory Visit (HOSPITAL_COMMUNITY): Payer: Self-pay

## 2021-09-02 ENCOUNTER — Other Ambulatory Visit: Payer: Self-pay

## 2021-09-02 ENCOUNTER — Encounter (HOSPITAL_COMMUNITY): Payer: Self-pay

## 2021-09-02 VITALS — BP 104/78 | HR 90 | Wt 152.0 lb

## 2021-09-02 DIAGNOSIS — I428 Other cardiomyopathies: Secondary | ICD-10-CM | POA: Diagnosis not present

## 2021-09-02 DIAGNOSIS — I5022 Chronic systolic (congestive) heart failure: Secondary | ICD-10-CM | POA: Diagnosis present

## 2021-09-02 DIAGNOSIS — F17210 Nicotine dependence, cigarettes, uncomplicated: Secondary | ICD-10-CM | POA: Insufficient documentation

## 2021-09-02 DIAGNOSIS — Z79899 Other long term (current) drug therapy: Secondary | ICD-10-CM | POA: Diagnosis not present

## 2021-09-02 DIAGNOSIS — Z720 Tobacco use: Secondary | ICD-10-CM | POA: Diagnosis not present

## 2021-09-02 DIAGNOSIS — F1010 Alcohol abuse, uncomplicated: Secondary | ICD-10-CM | POA: Diagnosis not present

## 2021-09-02 DIAGNOSIS — Q231 Congenital insufficiency of aortic valve: Secondary | ICD-10-CM | POA: Insufficient documentation

## 2021-09-02 DIAGNOSIS — R0902 Hypoxemia: Secondary | ICD-10-CM | POA: Insufficient documentation

## 2021-09-02 DIAGNOSIS — G4734 Idiopathic sleep related nonobstructive alveolar hypoventilation: Secondary | ICD-10-CM

## 2021-09-02 DIAGNOSIS — I5082 Biventricular heart failure: Secondary | ICD-10-CM | POA: Insufficient documentation

## 2021-09-02 DIAGNOSIS — Z7984 Long term (current) use of oral hypoglycemic drugs: Secondary | ICD-10-CM | POA: Insufficient documentation

## 2021-09-02 LAB — BASIC METABOLIC PANEL
Anion gap: 8 (ref 5–15)
BUN: 14 mg/dL (ref 6–20)
CO2: 23 mmol/L (ref 22–32)
Calcium: 9.1 mg/dL (ref 8.9–10.3)
Chloride: 108 mmol/L (ref 98–111)
Creatinine, Ser: 0.85 mg/dL (ref 0.44–1.00)
GFR, Estimated: >60 mL/min (ref >60)
Glucose, Bld: 85 mg/dL (ref 70–99)
Potassium: 4.3 mmol/L (ref 3.5–5.1)
Sodium: 139 mmol/L (ref 135–145)

## 2021-09-02 LAB — DIGOXIN LEVEL: Digoxin Level: <0.2 ng/mL — ABNORMAL LOW (ref 0.8–2.0)

## 2021-09-02 MED ORDER — SPIRONOLACTONE 25 MG PO TABS: 12.5000 mg | ORAL_TABLET | Freq: Every day | ORAL | 3 refills | Status: DC

## 2021-09-02 MED ORDER — CARVEDILOL 3.125 MG PO TABS: 3.1250 mg | ORAL_TABLET | Freq: Two times a day (BID) | ORAL | 3 refills | Status: DC

## 2021-09-02 MED ORDER — POTASSIUM CHLORIDE CRYS ER 20 MEQ PO TBCR: 20.0000 meq | EXTENDED_RELEASE_TABLET | Freq: Every day | ORAL | 3 refills | Status: DC

## 2021-09-02 NOTE — Patient Instructions (Signed)
Labs were performed today, if labs are abnormal the clinic will call you  START Potassium Chloride 20 meq daily  START Carvedilol 3.125 mg twice daily  You were given a prescription for a blood pressure cuff   Your physician recommends that you schedule a follow-up appointment in: 6 weeks and in 3-4 week with Pharmacy   You are scheduled for a TEE/Cardioversion/TEE Cardioversion on 09/11/2021 with Dr. Gala Romney.  Please arrive at the [facility] (Main Entrance A) at [facility]: [address] [place], [place] [address] at 0930 am. (1 hour prior to procedure unless lab work is needed; if lab work is needed arrive 1.5 hours ahead)  DIET: Nothing to eat or drink after midnight except a sip of water with medications (see medication instructions below)  Medication Instructions: Hold lasix   Labs: If patient is on Coumadin, patient needs pt/INR, CBC, BMET within 3 days (No pt/INR needed for patients taking Xarelto, Eliquis, Pradaxa) For patients receiving anesthesia for TEE and all Cardioversion patients: BMET, CBC within 1 week  PreProcedure labs were performed  You must have a responsible person to drive you home and stay in the waiting area during your procedure. Failure to do so could result in cancellation.  Bring your insurance cards.  *Special Note: Every effort is made to have your procedure done on time. Occasionally there are emergencies that occur at the hospital that may cause delays. Please be patient if a delay does occur.    At the [facility], you and your health needs are our priority. As part of our continuing mission to provide you with exceptional heart care, we have created designated Provider Care Teams. These Care Teams include your primary Cardiologist (physician) and Advanced Practice Providers (APPs- Physician Assistants and Nurse Practitioners) who all work together to provide you with the care you need, when you need it.   You may see  any of the following providers on your designated Care Team at your next follow up: Dr Arvilla Meres Dr Marca Ancona Dr Brandon Melnick, NP Robbie Lis, [place] Mikki Santee Karle Plumber, PharmD   Please be sure to bring in all your medications bottles to every appointment.    If you have any questions or concerns before your next appointment please send [place] a message through [place] or call our office at [phone].    TO LEAVE A MESSAGE FOR THE NURSE SELECT OPTION 2, PLEASE LEAVE A MESSAGE INCLUDING: YOUR NAME DATE OF BIRTH CALL BACK NUMBER REASON FOR CALL**this is important as we prioritize the call backs  YOU WILL RECEIVE A CALL BACK THE SAME DAY AS LONG AS YOU CALL BEFORE 4:00 PM

## 2021-09-03 ENCOUNTER — Other Ambulatory Visit (HOSPITAL_COMMUNITY): Payer: Self-pay | Admitting: Internal Medicine

## 2021-09-11 ENCOUNTER — Ambulatory Visit (HOSPITAL_COMMUNITY): Payer: No Typology Code available for payment source | Admitting: Certified Registered"

## 2021-09-11 ENCOUNTER — Encounter (HOSPITAL_COMMUNITY)
Admission: RE | Disposition: A | Discharge status: Home / Self Care | Payer: Self-pay | Source: Home / Self Care | Attending: Internal Medicine

## 2021-09-11 ENCOUNTER — Other Ambulatory Visit: Payer: Self-pay

## 2021-09-11 ENCOUNTER — Ambulatory Visit (HOSPITAL_BASED_OUTPATIENT_CLINIC_OR_DEPARTMENT_OTHER)
Admission: RE | Admit: 2021-09-11 | Discharge: 2021-09-11 | Disposition: A | Discharge status: Home / Self Care | Payer: No Typology Code available for payment source | Source: Ambulatory Visit | Attending: Internal Medicine | Admitting: Internal Medicine

## 2021-09-11 ENCOUNTER — Encounter (HOSPITAL_COMMUNITY): Payer: Self-pay | Admitting: Internal Medicine

## 2021-09-11 ENCOUNTER — Ambulatory Visit (HOSPITAL_COMMUNITY)
Admission: RE | Admit: 2021-09-11 | Discharge: 2021-09-11 | Disposition: A | Discharge status: Home / Self Care | Payer: No Typology Code available for payment source | Attending: Internal Medicine | Admitting: Internal Medicine

## 2021-09-11 DIAGNOSIS — Z7984 Long term (current) use of oral hypoglycemic drugs: Secondary | ICD-10-CM | POA: Insufficient documentation

## 2021-09-11 DIAGNOSIS — F17200 Nicotine dependence, unspecified, uncomplicated: Secondary | ICD-10-CM | POA: Diagnosis not present

## 2021-09-11 DIAGNOSIS — I5082 Biventricular heart failure: Secondary | ICD-10-CM | POA: Diagnosis not present

## 2021-09-11 DIAGNOSIS — Q211 Atrial septal defect: Secondary | ICD-10-CM

## 2021-09-11 DIAGNOSIS — I700 Atherosclerosis of aorta: Secondary | ICD-10-CM | POA: Diagnosis not present

## 2021-09-11 DIAGNOSIS — R0902 Hypoxemia: Secondary | ICD-10-CM | POA: Diagnosis not present

## 2021-09-11 DIAGNOSIS — Z793 Long term (current) use of hormonal contraceptives: Secondary | ICD-10-CM | POA: Diagnosis not present

## 2021-09-11 DIAGNOSIS — F1010 Alcohol abuse, uncomplicated: Secondary | ICD-10-CM | POA: Diagnosis not present

## 2021-09-11 DIAGNOSIS — I5022 Chronic systolic (congestive) heart failure: Secondary | ICD-10-CM | POA: Insufficient documentation

## 2021-09-11 DIAGNOSIS — I351 Nonrheumatic aortic (valve) insufficiency: Secondary | ICD-10-CM | POA: Diagnosis not present

## 2021-09-11 DIAGNOSIS — Q231 Congenital insufficiency of aortic valve: Secondary | ICD-10-CM

## 2021-09-11 DIAGNOSIS — Z79899 Other long term (current) drug therapy: Secondary | ICD-10-CM | POA: Insufficient documentation

## 2021-09-11 DIAGNOSIS — I428 Other cardiomyopathies: Secondary | ICD-10-CM | POA: Insufficient documentation

## 2021-09-11 HISTORY — PX: TEE WITHOUT CARDIOVERSION: SHX5443

## 2021-09-11 SURGERY — ECHOCARDIOGRAM, TRANSESOPHAGEAL
Anesthesia: Monitor Anesthesia Care

## 2021-09-11 MED ORDER — SODIUM CHLORIDE 0.9 % IV SOLN: INTRAVENOUS | Status: DC

## 2021-09-11 MED ORDER — PROPOFOL 10 MG/ML IV BOLUS
INTRAVENOUS | Status: DC | PRN
  Administered 2021-09-11: 20 mg via INTRAVENOUS

## 2021-09-11 MED ORDER — EPHEDRINE SULFATE 50 MG/ML IJ SOLN
INTRAMUSCULAR | Status: DC | PRN
  Administered 2021-09-11: 5 mg via INTRAVENOUS

## 2021-09-11 MED ORDER — PHENYLEPHRINE HCL (PRESSORS) 10 MG/ML IV SOLN
INTRAVENOUS | Status: DC | PRN
  Administered 2021-09-11: 80 ug via INTRAVENOUS
  Administered 2021-09-11: 40 ug via INTRAVENOUS
  Administered 2021-09-11: 80 ug via INTRAVENOUS
  Administered 2021-09-11: 40 ug via INTRAVENOUS
  Administered 2021-09-11: 80 ug via INTRAVENOUS

## 2021-09-11 MED ORDER — PROPOFOL 500 MG/50ML IV EMUL
INTRAVENOUS | Status: DC | PRN
  Administered 2021-09-11: 100 ug/kg/min via INTRAVENOUS

## 2021-09-11 MED ORDER — DEXMEDETOMIDINE (PRECEDEX) IN NS 20 MCG/5ML (4 MCG/ML) IV SYRINGE
PREFILLED_SYRINGE | INTRAVENOUS | Status: DC | PRN
  Administered 2021-09-11 (×6): 4 ug via INTRAVENOUS

## 2021-09-11 NOTE — Discharge Instructions (Signed)

## 2021-09-11 NOTE — Anesthesia Postprocedure Evaluation (Signed)
Anesthesia Post Note  Patient: Pamela Bryan  Procedure(s) Performed: TRANSESOPHAGEAL ECHOCARDIOGRAM (TEE)     Patient location during evaluation: Endoscopy Anesthesia Type: MAC Level of consciousness: awake Pain management: pain level controlled Vital Signs Assessment: post-procedure vital signs reviewed and stable Respiratory status: spontaneous breathing, nonlabored ventilation, respiratory function stable and patient connected to nasal cannula oxygen Cardiovascular status: stable and blood pressure returned to baseline Postop Assessment: no apparent nausea or vomiting Anesthetic complications: no   No notable events documented.  Last Vitals:  Vitals:   09/11/21 1040 09/11/21 1046  BP:  (!) (P) 97/56  Pulse: 67 65  Resp: 16 18  Temp:    SpO2: 96% 97%    Last Pain:  Vitals:   09/11/21 1046  TempSrc:   PainSc: (P) 0-No pain                 Carsen Machi P Erykah Lippert

## 2021-09-11 NOTE — Progress Notes (Signed)
  Echocardiogram Echocardiogram Transesophageal has been performed.  Pamela Bryan 09/11/2021, 10:47 AM

## 2021-09-11 NOTE — CV Procedure (Signed)
    TRANSESOPHAGEAL ECHOCARDIOGRAM   NAME:  Shikha Bibb   MRN: [id number] DOB:  1971/08/17   ADMIT DATE: 09/11/2021  INDICATIONS:  Aortic insufficiency   PROCEDURE:   Informed consent was obtained prior to the procedure. The risks, benefits and alternatives for the procedure were discussed and the patient comprehended these risks.  Risks include, but are not limited to, cough, sore throat, vomiting, nausea, somnolence, esophageal and stomach trauma or perforation, bleeding, low blood pressure, aspiration, pneumonia, infection, trauma to the teeth and death.    After a procedural time-out, the patient was sedated by the anesthesia service. The transesophageal probe was inserted in the esophagus and stomach without difficulty and multiple views were obtained.    COMPLICATIONS:    There were no immediate complications.  FINDINGS:  LEFT VENTRICLE: EF = 20%. Global HK. Markedly dilated.   RIGHT VENTRICLE: Moderate HK  LEFT ATRIUM: Moderately dilated  LEFT ATRIAL APPENDAGE: No thrombus.   RIGHT ATRIUM: Normal  AORTIC VALVE:  Probable unicuspid valve with severe eccentric AI  MITRAL VALVE:    Normal. Trivial MR  TRICUSPID VALVE: Normal. Trivial TR  PULMONIC VALVE: Grossly normal.  INTERATRIAL SEPTUM: Small PFO  PERICARDIUM: No effusion  DESCENDING AORTA: Mild plaque with diastolic flow reversal    Braylen Staller,MD 10:28 AM

## 2021-09-11 NOTE — Interval H&P Note (Signed)
History and Physical Interval Note:  09/11/2021 9:31 AM  Pamela Bryan  has presented today for surgery, with the diagnosis of EVALUATION FOR AORTIC HEART VALVE.  The various methods of treatment have been discussed with the patient and family. After consideration of risks, benefits and other options for treatment, the patient has consented to  Procedure(s): TRANSESOPHAGEAL ECHOCARDIOGRAM (TEE) (N/A) as a surgical intervention.  The patient's history has been reviewed, patient examined, no change in status, stable for surgery.  I have reviewed the patient's chart and labs.  Questions were answered to the patient's satisfaction.     [name]

## 2021-09-11 NOTE — Anesthesia Preprocedure Evaluation (Signed)
Anesthesia Evaluation  Patient identified by MRN, date of birth, ID band Patient awake    Reviewed: Allergy & Precautions, NPO status , Patient's Chart, lab work & pertinent test results  Airway Mallampati: II  TM Distance: >3 FB Neck ROM: Full    Dental no notable dental hx.    Pulmonary Current Smoker,    Pulmonary exam normal breath sounds clear to auscultation       Cardiovascular hypertension, Pt. on medications +CHF  Normal cardiovascular exam Rhythm:Regular Rate:Normal  ECHO: Left ventricular ejection fraction, by estimation, is <20%. The left ventricle has severely decreased function. The left ventricle demonstrates global hypokinesis. The left ventricular internal cavity size was severely dilated. Indeterminate diastolic filling due to E-A fusion. Right ventricular systolic function is normal. The right ventricular size is normal. Left atrial size was severely dilated. Right atrial size was mildly dilated. The mitral valve is normal in structure. Mild mitral valve regurgitation. No evidence of mitral stenosis. Aortic valve appears functionally bicuspid. AI jet appears to be moderate but pressure-half time suggests it may be severe. AS is mild by Doppler but may be underestimated in setting of severely depressed LV function. Consider TEE to further evalaute . The aortic valve is bicuspid. There is moderate calcification of the aortic valve. Aortic valve regurgitation is moderate. Mild aortic valve stenosis. Aortic regurgitation PHT measures 180 msec. Aortic valve area, by VTI measures 1.63 cm. Aortic valve mean gradient measures 14.0 mmHg. Aortic valve Vmax measures 2.35 m/s. There is mild dilatation of the ascending aorta, measuring 39 mm. The inferior vena cava is normal in size with greater than 50% respiratory variability, suggesting right atrial pressure of 3 mmHg.   Neuro/Psych PSYCHIATRIC DISORDERS negative  neurological ROS     GI/Hepatic negative GI ROS, (+) Cirrhosis     substance abuse  ,   Endo/Other  negative endocrine ROS  Renal/GU negative Renal ROS     Musculoskeletal negative musculoskeletal ROS (+)   Abdominal   Peds  Hematology negative hematology ROS (+)   Anesthesia Other Findings EVALUATION OF AORTIC HEART VALVE  Reproductive/Obstetrics                             Anesthesia Physical Anesthesia Plan  ASA: 4  Anesthesia Plan: MAC   Post-op Pain Management:    Induction: Intravenous  PONV Risk Score and Plan: 1 and Propofol infusion and Treatment may vary due to age or medical condition  Airway Management Planned: Nasal Cannula  Additional Equipment:   Intra-op Plan:   Post-operative Plan:   Informed Consent: I have reviewed the patients History and Physical, chart, labs and discussed the procedure including the risks, benefits and alternatives for the proposed anesthesia with the patient or authorized representative who has indicated his/her understanding and acceptance.     Dental advisory given  Plan Discussed with: CRNA  Anesthesia Plan Comments:         Anesthesia Quick Evaluation

## 2021-09-11 NOTE — Transfer of Care (Signed)
Immediate Anesthesia Transfer of Care Note  Patient: Rudolpho Sevin  Procedure(s) Performed: TRANSESOPHAGEAL ECHOCARDIOGRAM (TEE)  Patient Location: Endoscopy Unit  Anesthesia Type:MAC  Level of Consciousness: awake, drowsy and patient cooperative  Airway & Oxygen Therapy: Patient Spontanous Breathing and Patient connected to nasal cannula oxygen  Post-op Assessment: Report given to RN, Post -op Vital signs reviewed and stable and Patient moving all extremities X 4  Post vital signs: Reviewed and stable  Last Vitals:  Vitals Value Taken Time  BP    Temp    Pulse 61 09/11/21 1026  Resp 11 09/11/21 1026  SpO2 97 % 09/11/21 1026  Vitals shown include unvalidated device data.  Last Pain:  Vitals:   09/11/21 0849  TempSrc: Temporal  PainSc: 4          Complications: No notable events documented.

## 2021-09-12 ENCOUNTER — Encounter (HOSPITAL_COMMUNITY): Payer: Self-pay | Admitting: Internal Medicine

## 2021-09-30 ENCOUNTER — Inpatient Hospital Stay (HOSPITAL_COMMUNITY)
Admission: RE | Admit: 2021-09-30 | Discharge: 2021-09-30 | Disposition: A | Discharge status: Home / Self Care | Payer: No Typology Code available for payment source | Source: Ambulatory Visit

## 2021-10-02 ENCOUNTER — Encounter: Payer: No Typology Code available for payment source | Admitting: Surgery

## 2021-10-05 ENCOUNTER — Institutional Professional Consult (permissible substitution) (INDEPENDENT_AMBULATORY_CARE_PROVIDER_SITE_OTHER): Payer: No Typology Code available for payment source | Admitting: Surgery

## 2021-10-05 ENCOUNTER — Encounter: Payer: Self-pay | Admitting: Surgery

## 2021-10-05 ENCOUNTER — Other Ambulatory Visit: Payer: Self-pay

## 2021-10-05 VITALS — BP 113/71 | HR 74 | Resp 20 | Ht 67.0 in | Wt 153.0 lb

## 2021-10-05 DIAGNOSIS — I351 Nonrheumatic aortic (valve) insufficiency: Secondary | ICD-10-CM

## 2021-10-05 NOTE — Progress Notes (Signed)
Cardiothoracic Surgery Consultation   PCP is Patient, No Pcp Per (Inactive) Referring Provider is Bensimhon, Bevelyn Buckles, MD  Chief Complaint  Patient presents with   Aortic Insuffiency    Initial surgical consult, ECHO 8/4, cath 9/16    HPI:  The patient is a 50 year old woman with a history of alcohol and tobacco abuse who was admitted in March 2022 with acute systolic congestive heart failure with worsening bilateral lower extremity edema, 10 to 15 pound weight gain, and pulmonary edema with bilateral pleural effusions on chest x-ray.  She related that her symptoms began in January with orthopnea and PND and progressed to lower extremity edema few weeks prior to that admission.  2D echocardiogram on 03/12/2021 showed an ejection fraction of less than 20% with an LV diastolic diameter of 7.2 cm and a systolic diameter of 6.4 cm.  Stroke-volume index was 18.  Aortic insufficiency was moderate to severe with a pressure half-time of 188 ms.  Right ventricular function was felt to be normal at that time.  Cardiac catheterization on 03/13/2021 showed normal coronary arteries.  PA pressure is 36/17 with a mean of 25.  Wedge pressure was 20.  Cardiac index was 3.0.  Right atrial pressure was 4.  PA saturation was 74% and 78%.  She was continued on GDMT.  She had an ultrasound the abdomen that showed mildly nodular contour of the liver suggesting possible cirrhosis.  Cardiac MRI showed an aortic regurgitant fraction of 51% with an LVEF of 11%.  RVEF was 33%.  Right ventricle is mildly dilated.  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  She remains active with no shortness of breath or orthopnea.  She has had no peripheral edema.  She denies any chest pain or pressure.  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  Left ventricular ejection fraction was estimated at 20% with a markedly dilated left ventricle and  global hypokinesis.  Past Medical History:  Diagnosis Date   CHF (congestive heart failure) (HCC)     Past Surgical History:  Procedure Laterality Date   CHOLECYSTECTOMY     RIGHT/LEFT HEART CATH AND CORONARY ANGIOGRAPHY N/A 03/13/2021   Procedure: RIGHT/LEFT HEART CATH AND CORONARY ANGIOGRAPHY;  Surgeon: Dolores Patty, MD;  Location: [facility] INVASIVE CV LAB;  Service: Cardiovascular;  Laterality: N/A;   TEE WITHOUT CARDIOVERSION N/A 09/11/2021   Procedure: TRANSESOPHAGEAL ECHOCARDIOGRAM (TEE);  Surgeon: Dolores Patty, MD;  Location[facility] ENDOSCOPY;  Service: Cardiovascular;  Laterality: N/A;    Family History  Problem Relation Age of Onset   Valvular heart disease Sister     Social History Social History   Tobacco Use   Smoking status: Every Day   Smokeless tobacco: Never  Substance Use Topics   Alcohol use: Yes    Comment: occasional   Drug use: No    Current Outpatient Medications  Medication Sig Dispense Refill   buPROPion (WELLBUTRIN XL) 150 MG 24 hr tablet Take 150 mg by mouth 2 (two) times daily.     carvedilol (COREG) 3.125 MG tablet Take 1 tablet (3.125 mg total) by mouth 2 (two) times daily. 180 tablet 3   dapagliflozin propanediol (FARXIGA) 10 MG TABS tablet Take 1 tablet (10 mg total) by mouth daily. 30 tablet 6   digoxin (LANOXIN) 0.125 MG tablet Take 1 tablet (0.125 mg total) by mouth daily. 30 tablet 2  furosemide (LASIX) 40 MG tablet TAKE 1 TABLET (40 MG TOTAL) BY MOUTH DAILY AS NEEDED FOR FLUID OR EDEMA. 15 tablet 3   ivabradine (CORLANOR) 7.5 MG TABS tablet Take 1 tablet (7.5 mg total) by mouth 2 (two) times daily with a meal. 90 tablet 3   levonorgestrel (LILETTA, 52 MG,) 19.5 MCG/DAY IUD IUD 1 Intra Uterine Device by Intrauterine route once.     losartan (COZAAR) 25 MG tablet Take 1 tablet (25 mg total) by mouth at bedtime. 90 tablet 3   midodrine (PROAMATINE) 5 MG tablet Take 0.5 tablets (2.5 mg total) by mouth 2 (two) times daily with a meal. 45  tablet 3   Multiple Vitamin (MULTIVITAMIN) capsule Take 1 capsule by mouth daily.     potassium chloride SA (KLOR-CON) 20 MEQ tablet Take 1 tablet (20 mEq total) by mouth daily. 90 tablet 3   spironolactone (ALDACTONE) 25 MG tablet Take 0.5 tablets (12.5 mg total) by mouth daily. 30 tablet 3   No current facility-administered medications for this visit.    No Known Allergies  Review of Systems  Constitutional:  Negative for activity change, chills, fatigue, fever and unexpected weight change.  HENT: Negative.    Eyes: Negative.   Respiratory:  Negative for chest tightness and shortness of breath.   Cardiovascular:  Negative for chest pain, palpitations and leg swelling.  Gastrointestinal: Negative.   Endocrine: Negative.   Genitourinary: Negative.   Musculoskeletal: Negative.   Skin: Negative.   Allergic/Immunologic: Negative.   Neurological:  Negative for dizziness and syncope.  Hematological: Negative.   Psychiatric/Behavioral: Negative.     BP 113/71 (BP Location: Left Arm, Patient Position: Sitting)   Pulse 74   Resp 20   Ht 5\' 7"  (1.702 m)   Wt 153 lb (69.4 kg)   SpO2 97% Comment: RA  BMI 23.96 kg/m  Physical Exam Constitutional:      Appearance: Normal appearance. She is normal weight.  HENT:     Head: Normocephalic and atraumatic.  Eyes:     Extraocular Movements: Extraocular movements intact.     Conjunctiva/sclera: Conjunctivae normal.     Pupils: Pupils are equal, round, and reactive to light.  Neck:     Vascular: No carotid bruit.  Cardiovascular:     Rate and Rhythm: Normal rate and regular rhythm.     Heart sounds: Murmur heard.     Comments: 2/6 systolic murmur along the right sternal border.  2/6 diastolic murmur along the left lower sternal border. Pulmonary:     Effort: Pulmonary effort is normal.     Breath sounds: Normal breath sounds.  Musculoskeletal:        General: No swelling. Normal range of motion.     Cervical back: Normal range of  motion and neck supple.  Skin:    General: Skin is warm and dry.  Neurological:     General: No focal deficit present.     Mental Status: She is alert and oriented to person, place, and time.  Psychiatric:        Mood and Affect: Mood normal.        Behavior: Behavior normal.     Diagnostic Tests:  ECHOCARDIOGRAM REPORT         Patient Name:   LAKEISHA WALDROP Date of Exam: 07/30/2021  Medical Rec #:  09/[id number]     Height:       67.0 in  Accession #:    [id number]    Weight:  148.0 lb  Date of Birth:  03/15/1971      BSA:          1.779 m  Patient Age:    50 years      BP:           109/70 mmHg  Patient Gender: F             HR:           97 bpm.  Exam Location:  Outpatient   Procedure: 2D Echo   Indications:    congestive heart failure     History:        Patient has prior history of Echocardiogram examinations,  most                  recent 03/12/2021. Pulmonary edema.     Sonographer:    Delcie Roch RDCS  Referring Phys: [phone] AMY D CLEGG   IMPRESSIONS     1. Left ventricular ejection fraction, by estimation, is <20%. The left  ventricle has severely decreased function. The left ventricle demonstrates  global hypokinesis. The left ventricular internal cavity size was severely  dilated. Indeterminate  diastolic filling due to E-A fusion.   2. Right ventricular systolic function is normal. The right ventricular  size is normal.   3. Left atrial size was severely dilated.   4. Right atrial size was mildly dilated.   5. The mitral valve is normal in structure. Mild mitral valve  regurgitation. No evidence of mitral stenosis.   6. Aortic valve appears functionally bicuspid. AI jet appears to be  moderate but pressure-half time suggests it may be severe. AS is mild by  Doppler but may be underestimated in setting of severely depressed LV  function. Consider TEE to further evalaute   . The aortic valve is bicuspid. There is moderate calcification of the   aortic valve. Aortic valve regurgitation is moderate. Mild aortic valve  stenosis. Aortic regurgitation PHT measures 180 msec. Aortic valve area,  by VTI measures 1.63 cm. Aortic  valve mean gradient measures 14.0 mmHg. Aortic valve Vmax measures 2.35  m/s.   7. There is mild dilatation of the ascending aorta, measuring 39 mm.   8. The inferior vena cava is normal in size with greater than 50%  respiratory variability, suggesting right atrial pressure of 3 mmHg.   FINDINGS   Left Ventricle: Left ventricular ejection fraction, by estimation, is  <20%. The left ventricle has severely decreased function. The left  ventricle demonstrates global hypokinesis. The left ventricular internal  cavity size was severely dilated. There is  no left ventricular hypertrophy. Indeterminate diastolic filling due to  E-A fusion.   Right Ventricle: The right ventricular size is normal. No increase in  right ventricular wall thickness. Right ventricular systolic function is  normal.   Left Atrium: Left atrial size was severely dilated.   Right Atrium: Right atrial size was mildly dilated.   Pericardium: There is no evidence of pericardial effusion.   Mitral Valve: The mitral valve is normal in structure. Mild mitral valve  regurgitation. No evidence of mitral valve stenosis.   Tricuspid Valve: The tricuspid valve is normal in structure. Tricuspid  valve regurgitation is trivial. No evidence of tricuspid stenosis.   Aortic Valve: Aortic valve appears functionally bicuspid. AI jet appears  to be moderate but pressure-half time suggests it may be severe. AS is  mild by Doppler but may be underestimated in setting of severely depressed  LV  function. Consider TEE to  further evalaute. The aortic valve is bicuspid. There is moderate  calcification of the aortic valve. Aortic valve regurgitation is moderate.  Aortic regurgitation PHT measures 180 msec. Mild aortic stenosis is  present. Aortic valve  mean gradient measures  14.0 mmHg. Aortic valve peak gradient measures 22.1 mmHg. Aortic valve  area, by VTI measures 1.63 cm.   Pulmonic Valve: The pulmonic valve was normal in structure. Pulmonic valve  regurgitation is trivial. No evidence of pulmonic stenosis.   Aorta: The aortic root is normal in size and structure. There is mild  dilatation of the ascending aorta, measuring 39 mm.   Venous: The inferior vena cava is normal in size with greater than 50%  respiratory variability, suggesting right atrial pressure of 3 mmHg.   IAS/Shunts: No atrial level shunt detected by color flow Doppler.      LEFT VENTRICLE  PLAX 2D  LVIDd:         7.30 cm      Diastology  LVIDs:         6.70 cm      LV e' medial: 12.50 cm/s  LV PW:         0.80 cm  LV IVS:        0.70 cm  LVOT diam:     2.40 cm  LV SV:         63  LV SV Index:   36  LVOT Area:     4.52 cm     LV Volumes (MOD)  LV vol d, MOD A2C: 374.0 ml  LV vol s, MOD A2C: 296.0 ml  LV SV MOD A2C:     78.0 ml   RIGHT VENTRICLE             IVC  RV S prime:     10.20 cm/s  IVC diam: 1.80 cm  TAPSE (M-mode): 2.5 cm   LEFT ATRIUM              Index       RIGHT ATRIUM           Index  LA diam:        4.70 cm  2.64 cm/m  RA Area:     12.00 cm  LA Vol (A2C):   113.0 ml 63.51 ml/m RA Volume:   30.90 ml  17.37 ml/m  LA Vol (A4C):   91.7 ml  51.54 ml/m  LA Biplane Vol: 102.0 ml 57.32 ml/m   AORTIC VALVE  AV Area (Vmax):    1.47 cm  AV Area (Vmean):   1.32 cm  AV Area (VTI):     1.63 cm  AV Vmax:           235.00 cm/s  AV Vmean:          176.000 cm/s  AV VTI:            0.388 m  AV Peak Grad:      22.1 mmHg  AV Mean Grad:      14.0 mmHg  LVOT Vmax:         76.15 cm/s  LVOT Vmean:        51.450 cm/s  LVOT VTI:          0.140 m  LVOT/AV VTI ratio: 0.36  AI PHT:            180 msec     AORTA  Ao Root diam: 3.10 cm  Ao  Asc diam:  4.10 cm      SHUNTS  Systemic VTI:  0.14 m  Systemic Diam: 2.40 cm   Arvilla Meres  MD  Electronically signed by Arvilla Meres MD  Signature Date/Time: 07/30/2021/2:14:51 PM      TEE:  FINDINGS:   LEFT VENTRICLE: EF = 20%. Global HK. Markedly dilated.   RIGHT VENTRICLE: Moderate HK   LEFT ATRIUM: Moderately dilated   LEFT ATRIAL APPENDAGE: No thrombus.    RIGHT ATRIUM: Normal   AORTIC VALVE:  Probable unicuspid valve with severe eccentric AI   MITRAL VALVE:    Normal. Trivial MR   TRICUSPID VALVE: Normal. Trivial TR   PULMONIC VALVE: Grossly normal.   INTERATRIAL SEPTUM: Small PFO   PERICARDIUM: No effusion   DESCENDING AORTA: Mild plaque with diastolic flow reversal      Truman Hayward 10:28 AM            Electronically signed by Dolores Patty, MD at 09/11/2021 10:30 AM    Final     Physicians  Panel Physicians Referring Physician Case Authorizing Physician  Bensimhon, Bevelyn Buckles, MD (Primary)     Procedures  RIGHT/LEFT HEART CATH AND CORONARY ANGIOGRAPHY   Conclusion  Findings:   Ao = 86/68 (77) LV = 92/25 RA =  4 RV = 36/10 PA = 36/17 (25) PCW = 20 Fick cardiac output/index = 5.4/3.0 PVR = 1.0 WU SVR = 1088 Ao sat = 98% PA sat = 74%, 78% High SVC sat = 83%   Assessment: 1. Normal coronary arteries 2. 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 Elevated filling pressures 4. Cardiac output higher than I expected. Suspect peripheral shunting in setting of probable cirrhosis   Plan/Discussion:   Continue diuresis. Titrate GDMT. Consider cMRI. Check RUQ u/s to evaluate for cirrhosis.    Arvilla Meres, MD  6:15 PM       Indications  Acute systolic heart failure (HCC) [I50.21 (ICD-10-CM)]   Procedural Details  Technical Details The risks and indication of the procedure were explained. Consent was signed and placed on the chart. An appropriate timeout was taken prior to the procedure.   The right AC fossa was prepped and draped in the routine sterile fashion and anesthetized with 1% local lidocaine. The pre-existing PIV  in the right [facility] was exchanged over a wire for a 5 FR venous sheath using a modified Seldinger technique. A standard Swan-Ganz catheter was used for the procedure.   After a normal Allen's test was confirmed, the right wrist was prepped and draped in the routine sterile fashion and anesthetized with 1% local lidocaine. A 5 FR arterial sheath was then placed in the right radial artery using a modified Seldinger technique.  IV verapamil was given through the sheath. Systemic heparin was administered.  Standard catheters including a JL 3.5 and a JR 4 were used. All catheter exchanges were made over a wire.  Estimated blood loss <50 mL.   During this procedure medications were administered to achieve and maintain moderate conscious sedation while the patient's heart rate, blood pressure, and oxygen saturation were continuously monitored and I was present face-to-face 100% of this time.   Medications (Filter: Administrations occurring from 1710 to 1815 on 03/13/21)  important  Continuous medications are totaled by the amount administered until 03/13/21 1815.   midazolam (VERSED) injection (mg) Total dose:  1 mg Date/Time Rate/Dose/Volume Action   03/13/21 1736 1 mg Given    fentaNYL (SUBLIMAZE) injection (mcg) Total dose:  25 mcg  Date/Time Rate/Dose/Volume Action   03/13/21 1736 25 mcg Given    lidocaine (PF) (XYLOCAINE) 1 % injection (mL) Total volume:  5 mL Date/Time Rate/Dose/Volume Action   03/13/21 1743 5 mL Given    Heparin (Porcine) in NaCl 1000-0.9 UT/500ML-% SOLN (mL) Total volume:  1,000 mL Date/Time Rate/Dose/Volume Action   03/13/21 1743 500 mL Given   1743 500 mL Given    heparin sodium (porcine) injection (Units) Total dose:  3,500 Units Date/Time Rate/Dose/Volume Action   03/13/21 1750 3,500 Units Given    Radial Cocktail/Verapamil only (mL) Total volume:  10 mL Date/Time Rate/Dose/Volume Action   03/13/21 1746 10 mL Given    iohexol (OMNIPAQUE) 350 MG/ML  injection (mL) Total volume:  20 mL Date/Time Rate/Dose/Volume Action   03/13/21 1801 20 mL Given    0.9 %  sodium chloride infusion (mL) Total dose:  Cannot be calculated* Dosing weight:  74.8 *Administration dose not documented Date/Time Rate/Dose/Volume Action   03/13/21 1729 0  [vol]     0.9 %  sodium chloride infusion (mL/hr) Total dose:  Cannot be calculated* Dosing weight:  74.8 *Administration dose not documented Date/Time Rate/Dose/Volume Action   03/13/21 1729 80  [vol]     digoxin (LANOXIN) tablet 0.125 mg (mg) Total dose:  Cannot be calculated* Dosing weight:  74.8 *Administration dose not documented Date/Time Rate/Dose/Volume Action   03/13/21 1710 *Not included in total MAR Hold    enoxaparin (LOVENOX) injection 40 mg (mg) Total dose:  Cannot be calculated* Dosing weight:  74.8 *Administration dose not documented Date/Time Rate/Dose/Volume Action   03/13/21 1710 *Not included in total MAR Hold    furosemide (LASIX) injection 40 mg (mg) Total dose:  Cannot be calculated* Dosing weight:  74.8 *Administration dose not documented Date/Time Rate/Dose/Volume Action   03/13/21 1710 *Not included in total MAR Hold   1800 *Not included in total Automatically Held    nicotine (NICODERM CQ - dosed in mg/24 hours) patch 21 mg (mg) Total dose:  Cannot be calculated* Dosing weight:  74.8 *Administration dose not documented Date/Time Rate/Dose/Volume Action   03/13/21 1710 *Not included in total MAR Hold    potassium chloride (KLOR-CON) packet 20 mEq (mEq) Total dose:  Cannot be calculated* Dosing weight:  74.8 *Administration dose not documented Date/Time Rate/Dose/Volume Action   03/13/21 1710 *Not included in total MAR Hold    sodium chloride flush (NS) 0.9 % injection 3 mL (mL) Total dose:  Cannot be calculated* Dosing weight:  74.8 *Administration dose not documented Date/Time Rate/Dose/Volume Action   03/13/21 1710 *Not included in total MAR Hold     spironolactone (ALDACTONE) tablet 12.5 mg (mg) Total dose:  Cannot be calculated* Dosing weight:  74.8 *Administration dose not documented Date/Time Rate/Dose/Volume Action   03/13/21 1710 *Not included in total MAR Hold    Sedation Time  Sedation Time Physician-1: 22 minutes 34 seconds Contrast  Medication Name Total Dose  iohexol (OMNIPAQUE) 350 MG/ML injection 20 mL   Radiation/Fluoro  Fluoro time: 3.7 (min) DAP: 8572 (mGycm2) Cumulative Air Kerma: 117 (mGy) Coronary Findings  Diagnostic Dominance: Right Left Main  Vessel is angiographically normal.  Left Anterior Descending  Vessel is angiographically normal.  Left Circumflex  Vessel is angiographically normal.  Right Coronary Artery  Vessel is angiographically normal.  Intervention  No interventions have been documented. Wall Motion  Resting       All segments of the heart are hypokinetic.  EF 15%  Coronary Diagrams  Diagnostic Dominance: Right Intervention  Implants     No implant documentation for this case.   Syngo Images   Show images for CARDIAC CATHETERIZATION Images on Long Term Storage   Show images for [place], V[place]ter to Procedure Log  Procedure Log    Hemo Data  Flowsheet Row Most Recent Value  Fick Cardiac Output 5.37 L/min  Fick Cardiac Output Index 2.98 (L/min)/BSA  RA A Wave 8 mmHg  RA V Wave 6 mmHg  RA Mean 4 mmHg  RV Systolic Pressure 36 mmHg  RV Diastolic Pressure 4 mmHg  RV EDP 10 mmHg  PA Systolic Pressure 36 mmHg  PA Diastolic Pressure 17 mmHg  PA Mean 25 mmHg  PW A Wave 22 mmHg  PW V Wave 22 mmHg  PW Mean 20 mmHg  AO Systolic Pressure 86 mmHg  AO Diastolic Pressure 68 mmHg  AO Mean 77 mmHg  LV Systolic Pressure 92 mmHg  LV Diastolic Pressure 16 mmHg  LV EDP 25 mmHg  AOp Systolic Pressure 85 mmHg  AOp Diastolic Pressure 65 mmHg  AOp Mean Pressure 75 mmHg  LVp Systolic Pressure 96 mmHg  LVp Diastolic Pressure 15 mmHg  LVp EDP Pressure  25 mmHg  QP/QS 1  TPVR Index 8.38 HRUI  TSVR Index 25.81 HRUI  PVR SVR Ratio 0.07  TPVR/TSVR Ratio 0.32    Impression:  This 50 year old woman has a possible unicuspid aortic valve with severe aortic insufficiency of unknown duration presenting in March 2022 with acute systolic congestive heart failure.  Cardiac MRI at that time showed a regurgitant fraction of 51% with left ventricular ejection fraction of 11%.  There was moderate right ventricular systolic dysfunction with ejection fraction of 33%.  She has been stable on GDMT and is currently asymptomatic.  Her recent TEE showed an ejection fraction of 20% with a severely dilated left ventricle and severe aortic insufficiency.  Right ventricular systolic function is moderately depressed.  She has no coronary disease and it is felt that the etiology of this may be alcohol abuse or longstanding severe aortic insufficiency.  I think the best option for treating her is going to be aortic valve replacement using a bioprosthetic valve.  Her surgical risk is increased due to biventricular systolic dysfunction.  If the aortic valve is not replaced she will likely have progressively worsening left ventricular function and end-stage congestive heart failure that will require advanced therapies.  This could certainly happen at any point.  I reviewed the echocardiogram images with her and her mother and answered all their questions.  I told them that I would like to discuss her case further with Dr. Gala Romney before making any final recommendations and scheduling surgery.  Plan:  I will discuss her case further with Dr. Gala Romney and then will decide about aortic valve replacement.  I spent 45 minutes performing this consultation and > 50% of this time was spent face to face counseling and coordinating the care of this patient's severe aortic insufficiency and severe left ventricular systolic dysfunction.  Alleen Borne, MD [facility] [phone]

## 2021-10-13 ENCOUNTER — Telehealth (HOSPITAL_COMMUNITY): Payer: Self-pay

## 2021-10-13 NOTE — Telephone Encounter (Signed)
Called and left patient a detailed message of the below annotation to confirm/remind patient of their appointment at [facility] on 10/14/21.   I also left on the vm for patient to bring all medications and/or complete list.

## 2021-10-14 ENCOUNTER — Other Ambulatory Visit: Payer: Self-pay

## 2021-10-14 ENCOUNTER — Ambulatory Visit (HOSPITAL_COMMUNITY)
Admission: RE | Admit: 2021-10-14 | Discharge: 2021-10-14 | Disposition: A | Discharge status: Home / Self Care | Payer: No Typology Code available for payment source | Source: Ambulatory Visit | Attending: Family Medicine | Admitting: Family Medicine

## 2021-10-14 ENCOUNTER — Encounter (HOSPITAL_COMMUNITY): Payer: No Typology Code available for payment source

## 2021-10-14 ENCOUNTER — Encounter (HOSPITAL_COMMUNITY): Payer: Self-pay

## 2021-10-14 VITALS — BP 106/70 | HR 85 | Wt 151.6 lb

## 2021-10-14 DIAGNOSIS — I5082 Biventricular heart failure: Secondary | ICD-10-CM | POA: Diagnosis not present

## 2021-10-14 DIAGNOSIS — R0902 Hypoxemia: Secondary | ICD-10-CM | POA: Diagnosis not present

## 2021-10-14 DIAGNOSIS — Z7901 Long term (current) use of anticoagulants: Secondary | ICD-10-CM | POA: Insufficient documentation

## 2021-10-14 DIAGNOSIS — Q231 Congenital insufficiency of aortic valve: Secondary | ICD-10-CM | POA: Insufficient documentation

## 2021-10-14 DIAGNOSIS — K7469 Other cirrhosis of liver: Secondary | ICD-10-CM | POA: Diagnosis not present

## 2021-10-14 DIAGNOSIS — I5022 Chronic systolic (congestive) heart failure: Secondary | ICD-10-CM | POA: Insufficient documentation

## 2021-10-14 DIAGNOSIS — I428 Other cardiomyopathies: Secondary | ICD-10-CM | POA: Insufficient documentation

## 2021-10-14 DIAGNOSIS — F1010 Alcohol abuse, uncomplicated: Secondary | ICD-10-CM

## 2021-10-14 DIAGNOSIS — Z7984 Long term (current) use of oral hypoglycemic drugs: Secondary | ICD-10-CM | POA: Diagnosis not present

## 2021-10-14 DIAGNOSIS — F17200 Nicotine dependence, unspecified, uncomplicated: Secondary | ICD-10-CM | POA: Insufficient documentation

## 2021-10-14 DIAGNOSIS — Z720 Tobacco use: Secondary | ICD-10-CM

## 2021-10-14 DIAGNOSIS — Q248 Other specified congenital malformations of heart: Secondary | ICD-10-CM

## 2021-10-14 DIAGNOSIS — Z79899 Other long term (current) drug therapy: Secondary | ICD-10-CM | POA: Insufficient documentation

## 2021-10-14 DIAGNOSIS — G4734 Idiopathic sleep related nonobstructive alveolar hypoventilation: Secondary | ICD-10-CM

## 2021-10-14 LAB — BASIC METABOLIC PANEL
Anion gap: 10 (ref 5–15)
BUN: 19 mg/dL (ref 6–20)
CO2: 22 mmol/L (ref 22–32)
Calcium: 9.5 mg/dL (ref 8.9–10.3)
Chloride: 104 mmol/L (ref 98–111)
Creatinine, Ser: 0.92 mg/dL (ref 0.44–1.00)
GFR, Estimated: >60 mL/min (ref >60)
Glucose, Bld: 78 mg/dL (ref 70–99)
Potassium: 4 mmol/L (ref 3.5–5.1)
Sodium: 136 mmol/L (ref 135–145)

## 2021-10-14 LAB — DIGOXIN LEVEL: Digoxin Level: <0.2 ng/mL — ABNORMAL LOW (ref 0.8–2.0)

## 2021-10-14 NOTE — Patient Instructions (Signed)
It was great to see you today! No medication changes are needed at this time.  Labs today We will only contact you if something comes back abnormal or we need to make some changes. Otherwise no news is good news!    You are scheduled for a Cardiac Catheterization on Friday, November 4 with Dr. Arvilla Meres.  1. Please arrive at the [facility] (Main Entrance A) at [facility]: [address] [place], [place] [address] at 7:00 AM (This time is two hours before your procedure to ensure your preparation). Free valet parking service is available.   Special note: Every effort is made to have your procedure done on time. Please understand that emergencies sometimes delay scheduled procedures.  2. Diet: Do not eat solid foods after midnight.  The patient may have clear liquids until 5am upon the day of the procedure.  3. Labs: PRE PROCEDURE LABS DONE 10/14/21  4. Medication instructions in preparation for your procedure:   Contrast Allergy: No    Stop taking, Lasix (Furosemide)  Friday, November 4,    On the morning of your procedure, take your Aspirin and any morning medicines NOT listed above.  You may use sips of water.  5. Plan for one night stay--bring personal belongings. 6. Bring a current list of your medications and current insurance cards. 7. You MUST have a responsible person to drive you home. 8. Someone MUST be with you the first 24 hours after you arrive home or your discharge will be delayed. 9. Please wear clothes that are easy to get on and off and wear slip-on shoes.  Thank you for allowing [place] to care for you!   -- [facility]

## 2021-10-14 NOTE — H&P (View-Only) (Signed)
Advanced Heart Failure Clinic Note   PCP: [facility]  Jon Billings FNP  HF Cardiologist: Dr Gala Romney  HPI: Pamela Bryan is a 50 y.o. female with PMH of alcohol use disorder, tobacco use disorder and systolic HF due to NICM.   Admitted with 3/22 with acute systolic HF. EF 15-20% Cath with preserved cardiac output and severely reduced EF. MRI EF 11% concerning for possible myocarditis. Started on GDMT. Counseled stop drinking alcohol.   Echo 8/22 EF 20% severely dilated RV normal AoV functionally bicuspid mild AS at least moderate AI (reviewed by Dr. Gala Romney).   Losartan added at her follow up and she was scheduled for TEE to evaluate AV (? Bicuspid & severe AI) but cancelled as she did not have anyone to take her to procedure.  TEE (9/22) EF 20% with a severely dilated left ventricle and severe aortic insufficiency.  Right ventricular systolic function is moderately depressed.   Plan for aortic valve replacement with bioprosthetic valve 10/30/21 with Dr. Laneta Simmers.   Today she returns for HF follow up. She has no exertional dyspnea, CP, dizziness, edema, or PND/Orthopnea. Appetite ok. No fever or chills. Weight at home 143 pounds. Taking all medications. Took PRN lasix once last week. Her tubes are tied and she has IUD. She vapes but uses 0% nicotine cartridges. Works full time at [employer]. She is no longer drinking ETOH. Anxious about surgery and recent death of her ex-husband, whom she shares children with.  Cardiac studies:  - TEE (9/22): EF 20%, severely dilated LV and severe AI, RV moderately depressed.  - Echo (3/22): EF < 20% RV normal  - RHC/LHC 02/2021  Ao = 86/68 (77) LV = 92/25 RA =  4 RV = 36/10 PA = 36/17 (25) PCW = 20 Fick cardiac output/index = 5.4/3.0 PVR = 1.0 WU SVR = 1088 Ao sat = 98% PA sat = 74%, 78% High SVC sat = 83%   Assessment: 1. Normal coronary arteries 2. 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 Elevated filling pressures 4. Cardiac output higher than I  expected. Suspect peripheral shunting in setting of probable cirrhosis  - cMRI (02/2021):Marland Kitchen Severely dilated left ventricle with EF 11%, diffuse hypokinesis.  2.  Mildly dilated right ventricle with EF 33%.  3. Abnormal aortic valve, looks functionally bicuspid. By regurgitant fraction, there appears to be severe aortic stenosis. Would consider TEE confirmation.  4. Delayed enhancement imaging shows inferoseptal RV insertion site LGE which tends to be nonspecific and related to volume/pressure overload. However, the lateral wall subepicardial LGE is concerning for possible prior myocarditis (less likely sarcoidosis). It is not in a CAD pattern.  ROS: All systems negative except as listed in HPI, PMH and Problem List.  SH:  Social History   Socioeconomic History   Marital status: Divorced    Spouse name: Not on file   Number of children: Not on file   Years of education: Not on file   Highest education level: Not on file  Occupational History   Not on file  Tobacco Use   Smoking status: Every Day   Smokeless tobacco: Never  Substance and Sexual Activity   Alcohol use: Yes    Comment: occasional   Drug use: No   Sexual activity: Not on file  Other Topics Concern   Not on file  Social History Narrative   Not on file   Social Determinants of Health   Financial Resource Strain: Not on file  Food  Insecurity: Not on file  Transportation Needs: Not on file  Physical Activity: Not on file  Stress: Not on file  Social Connections: Not on file  Intimate Partner Violence: Not on file   FH:  Family History  Problem Relation Age of Onset   Valvular heart disease Sister    Past Medical History:  Diagnosis Date   CHF (congestive heart failure) (HCC)    Current Outpatient Medications  Medication Sig Dispense Refill   buPROPion (WELLBUTRIN XL) 150 MG 24 hr tablet Take 150 mg by mouth 2 (two) times daily.     carvedilol (COREG) 3.125 MG tablet Take 1 tablet (3.125 mg total) by  mouth 2 (two) times daily. 180 tablet 3   dapagliflozin propanediol (FARXIGA) 10 MG TABS tablet Take 1 tablet (10 mg total) by mouth daily. 30 tablet 6   digoxin (LANOXIN) 0.125 MG tablet Take 1 tablet (0.125 mg total) by mouth daily. 30 tablet 2   furosemide (LASIX) 40 MG tablet TAKE 1 TABLET (40 MG TOTAL) BY MOUTH DAILY AS NEEDED FOR FLUID OR EDEMA. 15 tablet 3   ivabradine (CORLANOR) 7.5 MG TABS tablet Take 1 tablet (7.5 mg total) by mouth 2 (two) times daily with a meal. 90 tablet 3   levonorgestrel (LILETTA, 52 MG,) 19.5 MCG/DAY IUD IUD 1 Intra Uterine Device by Intrauterine route once.     losartan (COZAAR) 25 MG tablet Take 1 tablet (25 mg total) by mouth at bedtime. 90 tablet 3   midodrine (PROAMATINE) 5 MG tablet Take 0.5 tablets (2.5 mg total) by mouth 2 (two) times daily with a meal. 45 tablet 3   Multiple Vitamin (MULTIVITAMIN) capsule Take 1 capsule by mouth daily.     potassium chloride SA (KLOR-CON) 20 MEQ tablet Take 1 tablet (20 mEq total) by mouth daily. 90 tablet 3   spironolactone (ALDACTONE) 25 MG tablet Take 0.5 tablets (12.5 mg total) by mouth daily. 30 tablet 3   No current facility-administered medications for this encounter.   BP 106/70   Pulse 85   Wt 68.8 kg (151 lb 9.6 oz)   SpO2 95%   BMI 23.74 kg/m   Wt Readings from Last 3 Encounters:  10/14/21 68.8 kg (151 lb 9.6 oz)  10/05/21 69.4 kg (153 lb)  09/11/21 68.9 kg (151 lb 14.4 oz)   PHYSICAL EXAM: General:  NAD. No resp difficulty, anxious HEENT: Normal Neck: Supple. No JVD. Carotids 2+ bilat; no bruits. No lymphadenopathy or thryomegaly appreciated. Cor: PMI nondisplaced. Regular rate & rhythm. No rubs, gallops or murmurs. Lungs: Clear Abdomen: Soft, nontender, nondistended. No hepatosplenomegaly. No bruits or masses. Good bowel sounds. Extremities: No cyanosis, clubbing, rash, edema Neuro: Alert & oriented x 3, cranial nerves grossly intact. Moves all 4 extremities w/o difficulty. Affect  pleasant.  ECG (personally reviewed): SR 77 bpm  ReDs: 34%  ASSESSMENT & PLAN: 1. Chronic systolic CHF, Biventricular CHF - Echo (03/12/21): EF 15% severe RV dysfunction. - Cath (03/13/21): EF 15% No CAD. Elevated filling pressures. High cardiac output.  - cMRI 03/16/2021  EF < 20%, possible myocaritis.  - Possible ETOH CM. TSH normal, UDS negative. - Echo (07/30/21): EF 20% severely dilated RV normal AoV functionally bicuspid, mild AS at least moderate AI. - TEE (9/22): EF 20%, severely dilated LV, RV moderately depressed, severe AI. - Stable NYHA I despite severe LV dysfunction. Volume status ok today, ReDs 34%. - Continue spiro 12.5 mg daily.  - Continue carvedilol 3.125 mg bid. - Continue digoxin 0.125 mg  daily. - Continue Farxiga 10 daily. - Continue Colanor 7.5 mg bid. HR 85 today. - Continue losartan 25 mg po qhs. I don't think BP will tolerate Entresto. - Continue midodrine 2.5 mg bid.  - BMET and dig level today.  2. Unicuspid Aortic Valve - This was underappreciated before. Suspect may be cause of cardiomyopathy as AI is severe. - AVR scheduled 11/02/21 with Dr. Laneta Simmers. - Will schedule RHC 10/30/21 with Dr. Gala Romney for optimization and plan to admit from cath lab.   3. High cardiac output on cath - RUQ u/s (3/22) suggestive of cirrhosis. - Continue midodrine 2.5 mg bid for now (discussed with Dr. Gala Romney).   4. Alcohol Use disorder: - Congratulated on stopping.   5. Tobacco Use disorder: - Vapes daily but has weaned down to 0% nicotine cartridges.  6. Night Time Hypoxia - Sleep study negative for OSA.  - Has night time hypoxia.  Follow up 1 week after discharge from AVR. She was given HCPOA/Living Will packet today.  [facility] FNP 10/14/21 12:18 PM

## 2021-10-14 NOTE — Progress Notes (Addendum)
Advanced Heart Failure Clinic Note   PCP: [facility]  Jon Billings FNP  HF Cardiologist: Dr Gala Romney  HPI: Pamela Bryan is a 50 y.o. female with PMH of alcohol use disorder, tobacco use disorder and systolic HF due to NICM.   Admitted with 3/22 with acute systolic HF. EF 15-20% Cath with preserved cardiac output and severely reduced EF. MRI EF 11% concerning for possible myocarditis. Started on GDMT. Counseled stop drinking alcohol.   Echo 8/22 EF 20% severely dilated RV normal AoV functionally bicuspid mild AS at least moderate AI (reviewed by Dr. Gala Romney).   Losartan added at her follow up and she was scheduled for TEE to evaluate AV (? Bicuspid & severe AI) but cancelled as she did not have anyone to take her to procedure.  TEE (9/22) EF 20% with a severely dilated left ventricle and severe aortic insufficiency.  Right ventricular systolic function is moderately depressed.   Plan for aortic valve replacement with bioprosthetic valve 10/30/21 with Dr. Laneta Simmers.   Today she returns for HF follow up. She has no exertional dyspnea, CP, dizziness, edema, or PND/Orthopnea. Appetite ok. No fever or chills. Weight at home 143 pounds. Taking all medications. Took PRN lasix once last week. Her tubes are tied and she has IUD. She vapes but uses 0% nicotine cartridges. Works full time at [employer]. She is no longer drinking ETOH. Anxious about surgery and recent death of her ex-husband, whom she shares children with.  Cardiac studies:  - TEE (9/22): EF 20%, severely dilated LV and severe AI, RV moderately depressed.  - Echo (3/22): EF < 20% RV normal  - RHC/LHC 02/2021  Ao = 86/68 (77) LV = 92/25 RA =  4 RV = 36/10 PA = 36/17 (25) PCW = 20 Fick cardiac output/index = 5.4/3.0 PVR = 1.0 WU SVR = 1088 Ao sat = 98% PA sat = 74%, 78% High SVC sat = 83%   Assessment: 1. Normal coronary arteries 2. 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 Elevated filling pressures 4. Cardiac output higher than I  expected. Suspect peripheral shunting in setting of probable cirrhosis  - cMRI (02/2021):Marland Kitchen Severely dilated left ventricle with EF 11%, diffuse hypokinesis.  2.  Mildly dilated right ventricle with EF 33%.  3. Abnormal aortic valve, looks functionally bicuspid. By regurgitant fraction, there appears to be severe aortic stenosis. Would consider TEE confirmation.  4. Delayed enhancement imaging shows inferoseptal RV insertion site LGE which tends to be nonspecific and related to volume/pressure overload. However, the lateral wall subepicardial LGE is concerning for possible prior myocarditis (less likely sarcoidosis). It is not in a CAD pattern.  ROS: All systems negative except as listed in HPI, PMH and Problem List.  SH:  Social History   Socioeconomic History   Marital status: Divorced    Spouse name: Not on file   Number of children: Not on file   Years of education: Not on file   Highest education level: Not on file  Occupational History   Not on file  Tobacco Use   Smoking status: Every Day   Smokeless tobacco: Never  Substance and Sexual Activity   Alcohol use: Yes    Comment: occasional   Drug use: No   Sexual activity: Not on file  Other Topics Concern   Not on file  Social History Narrative   Not on file   Social Determinants of Health   Financial Resource Strain: Not on file  Food  Insecurity: Not on file  Transportation Needs: Not on file  Physical Activity: Not on file  Stress: Not on file  Social Connections: Not on file  Intimate Partner Violence: Not on file   FH:  Family History  Problem Relation Age of Onset   Valvular heart disease Sister    Past Medical History:  Diagnosis Date   CHF (congestive heart failure) (HCC)    Current Outpatient Medications  Medication Sig Dispense Refill   buPROPion (WELLBUTRIN XL) 150 MG 24 hr tablet Take 150 mg by mouth 2 (two) times daily.     carvedilol (COREG) 3.125 MG tablet Take 1 tablet (3.125 mg total) by  mouth 2 (two) times daily. 180 tablet 3   dapagliflozin propanediol (FARXIGA) 10 MG TABS tablet Take 1 tablet (10 mg total) by mouth daily. 30 tablet 6   digoxin (LANOXIN) 0.125 MG tablet Take 1 tablet (0.125 mg total) by mouth daily. 30 tablet 2   furosemide (LASIX) 40 MG tablet TAKE 1 TABLET (40 MG TOTAL) BY MOUTH DAILY AS NEEDED FOR FLUID OR EDEMA. 15 tablet 3   ivabradine (CORLANOR) 7.5 MG TABS tablet Take 1 tablet (7.5 mg total) by mouth 2 (two) times daily with a meal. 90 tablet 3   levonorgestrel (LILETTA, 52 MG,) 19.5 MCG/DAY IUD IUD 1 Intra Uterine Device by Intrauterine route once.     losartan (COZAAR) 25 MG tablet Take 1 tablet (25 mg total) by mouth at bedtime. 90 tablet 3   midodrine (PROAMATINE) 5 MG tablet Take 0.5 tablets (2.5 mg total) by mouth 2 (two) times daily with a meal. 45 tablet 3   Multiple Vitamin (MULTIVITAMIN) capsule Take 1 capsule by mouth daily.     potassium chloride SA (KLOR-CON) 20 MEQ tablet Take 1 tablet (20 mEq total) by mouth daily. 90 tablet 3   spironolactone (ALDACTONE) 25 MG tablet Take 0.5 tablets (12.5 mg total) by mouth daily. 30 tablet 3   No current facility-administered medications for this encounter.   BP 106/70   Pulse 85   Wt 68.8 kg (151 lb 9.6 oz)   SpO2 95%   BMI 23.74 kg/m   Wt Readings from Last 3 Encounters:  10/14/21 68.8 kg (151 lb 9.6 oz)  10/05/21 69.4 kg (153 lb)  09/11/21 68.9 kg (151 lb 14.4 oz)   PHYSICAL EXAM: General:  NAD. No resp difficulty, anxious HEENT: Normal Neck: Supple. No JVD. Carotids 2+ bilat; no bruits. No lymphadenopathy or thryomegaly appreciated. Cor: PMI nondisplaced. Regular rate & rhythm. No rubs, gallops or murmurs. Lungs: Clear Abdomen: Soft, nontender, nondistended. No hepatosplenomegaly. No bruits or masses. Good bowel sounds. Extremities: No cyanosis, clubbing, rash, edema Neuro: Alert & oriented x 3, cranial nerves grossly intact. Moves all 4 extremities w/o difficulty. Affect  pleasant.  ECG (personally reviewed): SR 77 bpm  ReDs: 34%  ASSESSMENT & PLAN: 1. Chronic systolic CHF, Biventricular CHF - Echo (03/12/21): EF 15% severe RV dysfunction. - Cath (03/13/21): EF 15% No CAD. Elevated filling pressures. High cardiac output.  - cMRI 03/16/2021  EF < 20%, possible myocaritis.  - Possible ETOH CM. TSH normal, UDS negative. - Echo (07/30/21): EF 20% severely dilated RV normal AoV functionally bicuspid, mild AS at least moderate AI. - TEE (9/22): EF 20%, severely dilated LV, RV moderately depressed, severe AI. - Stable NYHA I despite severe LV dysfunction. Volume status ok today, ReDs 34%. - Continue spiro 12.5 mg daily.  - Continue carvedilol 3.125 mg bid. - Continue digoxin 0.125 mg   daily. - Continue Farxiga 10 daily. - Continue Colanor 7.5 mg bid. HR 85 today. - Continue losartan 25 mg po qhs. I don't think BP will tolerate Entresto. - Continue midodrine 2.5 mg bid.  - BMET and dig level today.  2. Unicuspid Aortic Valve - This was underappreciated before. Suspect may be cause of cardiomyopathy as AI is severe. - AVR scheduled 11/02/21 with Dr. Bartle. - Will schedule RHC 10/30/21 with Dr. Bensimhon for optimization and plan to admit from cath lab.   3. High cardiac output on cath - RUQ u/s (3/22) suggestive of cirrhosis. - Continue midodrine 2.5 mg bid for now (discussed with Dr. Bensimhon).   4. Alcohol Use disorder: - Congratulated on stopping.   5. Tobacco Use disorder: - Vapes daily but has weaned down to 0% nicotine cartridges.  6. Night Time Hypoxia - Sleep study negative for OSA.  - Has night time hypoxia.  Follow up 1 week after discharge from AVR. She was given HCPOA/Living Will packet today.  Dezaray Shibuya M Moss Berry FNP 10/14/21 12:18 PM 

## 2021-10-14 NOTE — Progress Notes (Signed)
ReDS Vest / Clip - 10/14/21 1200       ReDS Vest / Clip   Station Marker C    Ruler Value 27.5    ReDS Value Range Low volume    ReDS Actual Value 34

## 2021-10-15 ENCOUNTER — Other Ambulatory Visit (HOSPITAL_COMMUNITY): Payer: Self-pay | Admitting: *Deleted

## 2021-10-15 DIAGNOSIS — I5022 Chronic systolic (congestive) heart failure: Secondary | ICD-10-CM

## 2021-10-15 NOTE — Addendum Note (Signed)
Encounter addended by: Jacklynn Ganong, FNP on: 10/15/2021 9:10 AM  Actions taken: Clinical Note Signed

## 2021-10-21 ENCOUNTER — Encounter (HOSPITAL_COMMUNITY): Payer: Self-pay | Admitting: *Deleted

## 2021-10-22 ENCOUNTER — Telehealth: Payer: Self-pay

## 2021-10-22 NOTE — Telephone Encounter (Signed)
FMLA form completed for [employer]. Patient called to pick up at front desk.Beginning leave 11/02/21 through 01/25/22

## 2021-10-29 LAB — ECHO TEE: P 1/2 time: 400 msec

## 2021-10-30 ENCOUNTER — Ambulatory Visit (HOSPITAL_BASED_OUTPATIENT_CLINIC_OR_DEPARTMENT_OTHER)
Admission: RE | Admit: 2021-10-30 | Discharge: 2021-10-30 | Disposition: A | Discharge status: Home / Self Care | Payer: No Typology Code available for payment source | Source: Home / Self Care | Attending: Internal Medicine | Admitting: Internal Medicine

## 2021-10-30 ENCOUNTER — Ambulatory Visit (HOSPITAL_COMMUNITY): Payer: No Typology Code available for payment source

## 2021-10-30 ENCOUNTER — Other Ambulatory Visit: Payer: Self-pay

## 2021-10-30 ENCOUNTER — Encounter (HOSPITAL_COMMUNITY): Payer: Self-pay | Admitting: Internal Medicine

## 2021-10-30 ENCOUNTER — Ambulatory Visit (HOSPITAL_BASED_OUTPATIENT_CLINIC_OR_DEPARTMENT_OTHER): Payer: No Typology Code available for payment source

## 2021-10-30 ENCOUNTER — Encounter (HOSPITAL_COMMUNITY): Payer: Self-pay

## 2021-10-30 ENCOUNTER — Encounter (HOSPITAL_COMMUNITY)
Admission: RE | Disposition: A | Discharge status: Home / Self Care | Payer: Self-pay | Source: Home / Self Care | Attending: Internal Medicine

## 2021-10-30 DIAGNOSIS — R0902 Hypoxemia: Secondary | ICD-10-CM | POA: Insufficient documentation

## 2021-10-30 DIAGNOSIS — F17200 Nicotine dependence, unspecified, uncomplicated: Secondary | ICD-10-CM | POA: Insufficient documentation

## 2021-10-30 DIAGNOSIS — Z793 Long term (current) use of hormonal contraceptives: Secondary | ICD-10-CM | POA: Insufficient documentation

## 2021-10-30 DIAGNOSIS — I5022 Chronic systolic (congestive) heart failure: Secondary | ICD-10-CM | POA: Insufficient documentation

## 2021-10-30 DIAGNOSIS — Z01818 Encounter for other preprocedural examination: Secondary | ICD-10-CM

## 2021-10-30 DIAGNOSIS — Z7984 Long term (current) use of oral hypoglycemic drugs: Secondary | ICD-10-CM | POA: Insufficient documentation

## 2021-10-30 DIAGNOSIS — I351 Nonrheumatic aortic (valve) insufficiency: Secondary | ICD-10-CM | POA: Insufficient documentation

## 2021-10-30 DIAGNOSIS — I5082 Biventricular heart failure: Secondary | ICD-10-CM | POA: Insufficient documentation

## 2021-10-30 DIAGNOSIS — F1090 Alcohol use, unspecified, uncomplicated: Secondary | ICD-10-CM | POA: Insufficient documentation

## 2021-10-30 DIAGNOSIS — I6523 Occlusion and stenosis of bilateral carotid arteries: Secondary | ICD-10-CM | POA: Insufficient documentation

## 2021-10-30 DIAGNOSIS — Z20822 Contact with and (suspected) exposure to covid-19: Secondary | ICD-10-CM | POA: Insufficient documentation

## 2021-10-30 DIAGNOSIS — I110 Hypertensive heart disease with heart failure: Secondary | ICD-10-CM | POA: Insufficient documentation

## 2021-10-30 DIAGNOSIS — Z01810 Encounter for preprocedural cardiovascular examination: Secondary | ICD-10-CM

## 2021-10-30 DIAGNOSIS — Z79899 Other long term (current) drug therapy: Secondary | ICD-10-CM | POA: Insufficient documentation

## 2021-10-30 HISTORY — PX: RIGHT HEART CATH: CATH118263

## 2021-10-30 HISTORY — DX: Atherosclerotic heart disease of native coronary artery without angina pectoris: I25.10

## 2021-10-30 HISTORY — DX: Essential (primary) hypertension: I10

## 2021-10-30 HISTORY — DX: Personal history of other diseases of the digestive system: Z87.19

## 2021-10-30 LAB — COMPREHENSIVE METABOLIC PANEL
ALT: 18 U/L (ref 0–44)
AST: 30 U/L (ref 15–41)
Albumin: 3.5 g/dL (ref 3.5–5.0)
Alkaline Phosphatase: 62 U/L (ref 38–126)
Anion gap: 11 (ref 5–15)
BUN: 18 mg/dL (ref 6–20)
CO2: 21 mmol/L — ABNORMAL LOW (ref 22–32)
Calcium: 8.7 mg/dL — ABNORMAL LOW (ref 8.9–10.3)
Chloride: 106 mmol/L (ref 98–111)
Creatinine, Ser: 0.79 mg/dL (ref 0.44–1.00)
GFR, Estimated: >60 mL/min (ref >60)
Glucose, Bld: 88 mg/dL (ref 70–99)
Potassium: 3.8 mmol/L (ref 3.5–5.1)
Sodium: 138 mmol/L (ref 135–145)
Total Bilirubin: 0.7 mg/dL (ref 0.3–1.2)
Total Protein: 5.7 g/dL — ABNORMAL LOW (ref 6.5–8.1)

## 2021-10-30 LAB — CBC
HCT: 43.2 % (ref 36.0–46.0)
HCT: 40.5 % (ref 36.0–46.0)
Hemoglobin: 14.8 g/dL (ref 12.0–15.0)
Hemoglobin: 13.4 g/dL (ref 12.0–15.0)
MCH: 33.6 pg (ref 26.0–34.0)
MCH: 32.9 pg (ref 26.0–34.0)
MCHC: 34.3 g/dL (ref 30.0–36.0)
MCHC: 33.1 g/dL (ref 30.0–36.0)
MCV: 98.2 fL (ref 80.0–100.0)
MCV: 99.5 fL (ref 80.0–100.0)
Platelets: 287 10*3/uL (ref 150–400)
Platelets: 259 10*3/uL (ref 150–400)
RBC: 4.4 MIL/uL (ref 3.87–5.11)
RBC: 4.07 MIL/uL (ref 3.87–5.11)
RDW: 12.5 % (ref 11.5–15.5)
RDW: 12.5 % (ref 11.5–15.5)
WBC: 7.4 10*3/uL (ref 4.0–10.5)
WBC: 5.8 10*3/uL (ref 4.0–10.5)
nRBC: 0 % (ref 0.0–0.2)
nRBC: 0 % (ref 0.0–0.2)

## 2021-10-30 LAB — POCT I-STAT EG7
Acid-Base Excess: 0 mmol/L (ref 0.0–2.0)
Acid-base deficit: 1 mmol/L (ref 0.0–2.0)
Bicarbonate: 24 mmol/L (ref 20.0–28.0)
Bicarbonate: 25.9 mmol/L (ref 20.0–28.0)
Calcium, Ion: 0.99 mmol/L — ABNORMAL LOW (ref 1.15–1.40)
Calcium, Ion: 1.15 mmol/L (ref 1.15–1.40)
HCT: 37 % (ref 36.0–46.0)
HCT: 40 % (ref 36.0–46.0)
Hemoglobin: 12.6 g/dL (ref 12.0–15.0)
Hemoglobin: 13.6 g/dL (ref 12.0–15.0)
O2 Saturation: 65 %
O2 Saturation: 69 %
Potassium: 3.1 mmol/L — ABNORMAL LOW (ref 3.5–5.1)
Potassium: 3.6 mmol/L (ref 3.5–5.1)
Sodium: 142 mmol/L (ref 135–145)
Sodium: 145 mmol/L (ref 135–145)
TCO2: 25 mmol/L (ref 22–32)
TCO2: 27 mmol/L (ref 22–32)
pCO2, Ven: 42.5 mmHg — ABNORMAL LOW (ref 44.0–60.0)
pCO2, Ven: 46 mmHg (ref 44.0–60.0)
pH, Ven: 7.359 (ref 7.250–7.430)
pH, Ven: 7.36 (ref 7.250–7.430)
pO2, Ven: 36 mmHg (ref 32.0–45.0)
pO2, Ven: 37 mmHg (ref 32.0–45.0)

## 2021-10-30 LAB — APTT: aPTT: 27 seconds (ref 24–36)

## 2021-10-30 LAB — PROTIME-INR
INR: 1 (ref 0.8–1.2)
Prothrombin Time: 12.8 seconds (ref 11.4–15.2)

## 2021-10-30 LAB — BLOOD GAS, ARTERIAL
Acid-Base Excess: 0.6 mmol/L (ref 0.0–2.0)
Bicarbonate: 24.6 mmol/L (ref 20.0–28.0)
FIO2: 21
O2 Saturation: 94.6 %
Patient temperature: 37
pCO2 arterial: 39.1 mmHg (ref 32.0–48.0)
pH, Arterial: 7.416 (ref 7.350–7.450)
pO2, Arterial: 74.4 mmHg — ABNORMAL LOW (ref 83.0–108.0)

## 2021-10-30 LAB — HEMOGLOBIN A1C
Hgb A1c MFr Bld: 5.6 % (ref 4.8–5.6)
Mean Plasma Glucose: 114.02 mg/dL

## 2021-10-30 LAB — URINALYSIS, ROUTINE W REFLEX MICROSCOPIC
Bilirubin Urine: NEGATIVE
Glucose, UA: >=500 mg/dL — AB
Hgb urine dipstick: NEGATIVE
Ketones, ur: NEGATIVE mg/dL
Nitrite: NEGATIVE
Protein, ur: NEGATIVE mg/dL
Specific Gravity, Urine: 1.023 (ref 1.005–1.030)
WBC, UA: >50 WBC/hpf — ABNORMAL HIGH (ref 0–5)
pH: 5 (ref 5.0–8.0)

## 2021-10-30 LAB — SURGICAL PCR SCREEN
MRSA, PCR: NEGATIVE
Staphylococcus aureus: NEGATIVE

## 2021-10-30 LAB — TYPE AND SCREEN
ABO/RH(D): A POS
Antibody Screen: NEGATIVE

## 2021-10-30 LAB — ABO/RH: ABO/RH(D): A POS

## 2021-10-30 LAB — SARS CORONAVIRUS 2 (TAT 6-24 HRS): SARS Coronavirus 2: NEGATIVE

## 2021-10-30 SURGERY — RIGHT HEART CATH
Anesthesia: LOCAL

## 2021-10-30 MED ORDER — SODIUM CHLORIDE 0.9% FLUSH: 3.0000 mL | INTRAVENOUS | Status: DC | PRN

## 2021-10-30 MED ORDER — NITROGLYCERIN IN D5W 200-5 MCG/ML-% IV SOLN
2.0000 ug/min | INTRAVENOUS | Status: DC
  Filled 2021-10-30: qty 250

## 2021-10-30 MED ORDER — SODIUM CHLORIDE 0.9% FLUSH: 3.0000 mL | Freq: Two times a day (BID) | INTRAVENOUS | Status: DC

## 2021-10-30 MED ORDER — MIDAZOLAM HCL 2 MG/2ML IJ SOLN
INTRAMUSCULAR | Status: AC
  Filled 2021-10-30: qty 2

## 2021-10-30 MED ORDER — DEXMEDETOMIDINE HCL IN NACL 400 MCG/100ML IV SOLN
0.1000 ug/kg/h | INTRAVENOUS | Status: DC
  Filled 2021-10-30: qty 100

## 2021-10-30 MED ORDER — FENTANYL CITRATE (PF) 100 MCG/2ML IJ SOLN
INTRAMUSCULAR | Status: AC
  Filled 2021-10-30: qty 2

## 2021-10-30 MED ORDER — ASPIRIN 81 MG PO CHEW
81.0000 mg | CHEWABLE_TABLET | ORAL | Status: AC
  Administered 2021-10-30: 81 mg via ORAL
  Filled 2021-10-30: qty 1

## 2021-10-30 MED ORDER — ACETAMINOPHEN 325 MG PO TABS: 650.0000 mg | ORAL_TABLET | ORAL | Status: DC | PRN

## 2021-10-30 MED ORDER — EPINEPHRINE HCL 5 MG/250ML IV SOLN IN NS
0.0000 ug/min | INTRAVENOUS | Status: DC
  Filled 2021-10-30: qty 250

## 2021-10-30 MED ORDER — VANCOMYCIN HCL 1250 MG/250ML IV SOLN
1250.0000 mg | INTRAVENOUS | Status: DC
  Filled 2021-10-30: qty 250

## 2021-10-30 MED ORDER — MANNITOL 20 % IV SOLN
INTRAVENOUS | Status: DC
  Filled 2021-10-30: qty 13

## 2021-10-30 MED ORDER — PHENYLEPHRINE HCL-NACL 20-0.9 MG/250ML-% IV SOLN
30.0000 ug/min | INTRAVENOUS | Status: DC
  Filled 2021-10-30: qty 250

## 2021-10-30 MED ORDER — LIDOCAINE HCL (PF) 1 % IJ SOLN
INTRAMUSCULAR | Status: DC | PRN
  Administered 2021-10-30: 1 mL
  Administered 2021-10-30: 2 mL

## 2021-10-30 MED ORDER — SODIUM CHLORIDE 0.9 % IV SOLN: 250.0000 mL | INTRAVENOUS | Status: DC | PRN

## 2021-10-30 MED ORDER — LABETALOL HCL 5 MG/ML IV SOLN: 10.0000 mg | INTRAVENOUS | Status: DC | PRN

## 2021-10-30 MED ORDER — LIDOCAINE HCL (PF) 1 % IJ SOLN
INTRAMUSCULAR | Status: AC
  Filled 2021-10-30: qty 30

## 2021-10-30 MED ORDER — PLASMA-LYTE A IV SOLN
INTRAVENOUS | Status: DC
  Filled 2021-10-30: qty 5

## 2021-10-30 MED ORDER — SODIUM CHLORIDE 0.9 % IV SOLN: INTRAVENOUS | Status: DC

## 2021-10-30 MED ORDER — CEFAZOLIN SODIUM-DEXTROSE 2-4 GM/100ML-% IV SOLN
2.0000 g | INTRAVENOUS | Status: DC
  Filled 2021-10-30: qty 100

## 2021-10-30 MED ORDER — POTASSIUM CHLORIDE 2 MEQ/ML IV SOLN
80.0000 meq | INTRAVENOUS | Status: DC
  Filled 2021-10-30: qty 40

## 2021-10-30 MED ORDER — NOREPINEPHRINE 4 MG/250ML-% IV SOLN
0.0000 ug/min | INTRAVENOUS | Status: DC
  Filled 2021-10-30: qty 250

## 2021-10-30 MED ORDER — ONDANSETRON HCL 4 MG/2ML IJ SOLN: 4.0000 mg | Freq: Four times a day (QID) | INTRAMUSCULAR | Status: DC | PRN

## 2021-10-30 MED ORDER — MIDAZOLAM HCL 2 MG/2ML IJ SOLN
INTRAMUSCULAR | Status: DC | PRN
  Administered 2021-10-30: 1 mg via INTRAVENOUS

## 2021-10-30 MED ORDER — HYDRALAZINE HCL 20 MG/ML IJ SOLN: 10.0000 mg | INTRAMUSCULAR | Status: DC | PRN

## 2021-10-30 MED ORDER — TRANEXAMIC ACID 1000 MG/10ML IV SOLN
1.5000 mg/kg/h | INTRAVENOUS | Status: DC
  Filled 2021-10-30: qty 25

## 2021-10-30 MED ORDER — HEPARIN (PORCINE) IN NACL 1000-0.9 UT/500ML-% IV SOLN
INTRAVENOUS | Status: DC | PRN
  Administered 2021-10-30: 500 mL

## 2021-10-30 MED ORDER — HEPARIN (PORCINE) IN NACL 1000-0.9 UT/500ML-% IV SOLN
INTRAVENOUS | Status: AC
  Filled 2021-10-30: qty 500

## 2021-10-30 MED ORDER — MILRINONE LACTATE IN DEXTROSE 20-5 MG/100ML-% IV SOLN
0.3000 ug/kg/min | INTRAVENOUS | Status: DC
  Filled 2021-10-30: qty 100

## 2021-10-30 MED ORDER — HEPARIN 30,000 UNITS/1000 ML (OHS) CELLSAVER SOLUTION
Status: DC
  Filled 2021-10-30: qty 1000

## 2021-10-30 MED ORDER — FENTANYL CITRATE (PF) 100 MCG/2ML IJ SOLN
INTRAMUSCULAR | Status: DC | PRN
  Administered 2021-10-30: 25 ug via INTRAVENOUS

## 2021-10-30 MED ORDER — INSULIN REGULAR(HUMAN) IN NACL 100-0.9 UT/100ML-% IV SOLN
INTRAVENOUS | Status: DC
  Filled 2021-10-30: qty 100

## 2021-10-30 MED ORDER — TRANEXAMIC ACID (OHS) BOLUS VIA INFUSION: 15.0000 mg/kg | INTRAVENOUS | Status: DC

## 2021-10-30 MED ORDER — TRANEXAMIC ACID (OHS) PUMP PRIME SOLUTION
2.0000 mg/kg | INTRAVENOUS | Status: DC
  Filled 2021-10-30: qty 1.32

## 2021-10-30 SURGICAL SUPPLY — 12 items
CATH BALLN WEDGE 5F 110CM (CATHETERS) ×2 IMPLANT
GUIDEWIRE .025 260CM (WIRE) ×2 IMPLANT
KIT MICROPUNCTURE NIT STIFF (SHEATH) ×2 IMPLANT
PACK CARDIAC CATHETERIZATION (CUSTOM PROCEDURE TRAY) ×2 IMPLANT
PROTECTION STATION PRESSURIZED (MISCELLANEOUS) ×2
SHEATH GLIDE SLENDER 4/5FR (SHEATH) ×4 IMPLANT
SHEATH PROBE COVER 6X72 (BAG) ×2 IMPLANT
STATION PROTECTION PRESSURIZED (MISCELLANEOUS) ×1 IMPLANT
TRANSDUCER W/STOPCOCK (MISCELLANEOUS) ×2 IMPLANT
TUBING ART PRESS 72  MALE/FEM (TUBING) ×1
TUBING ART PRESS 72 MALE/FEM (TUBING) ×1 IMPLANT
WIRE MICROINTRODUCER 60CM (WIRE) ×2 IMPLANT

## 2021-10-30 NOTE — Progress Notes (Signed)
PCP - no pcp Cardiologist - Daniel bensimmon  PPM/ICD - denies   Chest x-ray - 10/30/21 EKG - 10/14/21 Stress Test - denies ECHO - 09/11/21 Cardiac Cath - 10/30/21  Sleep Study - 07/13/21, negative for OSA CPAP - no cpap ordered  No diabetes  As of today, STOP taking any Aspirin (unless otherwise instructed by your surgeon) Aleve, Naproxen, Ibuprofen, Motrin, Advil, Goody's, BC's, all herbal medications, fish oil, and all vitamins.  ERAS Protcol -no   COVID TEST- 10/30/21   Anesthesia review: yes. Revonda Standard and Schriever notified  Patient denies shortness of breath, fever, cough and chest pain at PAT appointment   All instructions explained to the patient, with a verbal understanding of the material. Patient agrees to go over the instructions while at home for a better understanding. Patient also instructed to self quarantine after being tested for COVID-19. The opportunity to ask questions was provided.

## 2021-10-30 NOTE — Progress Notes (Signed)
Pre-AVR Dopplers completed. Refer to "CV Proc" under chart review to view preliminary results.  10/30/2021 11:09 AM Eula Fried., MHA, RVT, RDCS, RDMS

## 2021-10-30 NOTE — Progress Notes (Signed)
Discussed IS (1250-1500 ml), sternal precautions, mobility post op, and d/c planning. Pt receptive. Gave materials. Encouraged IS over the weekend.  [id number] Ethelda Chick CES, ACSM 11:47 AM 10/30/2021

## 2021-10-30 NOTE — Interval H&P Note (Signed)
History and Physical Interval Note:  10/30/2021 8:33 AM  Pamela Bryan  has presented today for surgery, with the diagnosis of severe aortic insufficiency.  The various methods of treatment have been discussed with the patient and family. After consideration of risks, benefits and other options for treatment, the patient has consented to  Procedure(s): RIGHT HEART CATH (N/A) as a surgical intervention.  The patient's history has been reviewed, patient examined, no change in status, stable for surgery.  I have reviewed the patient's chart and labs.  Questions were answered to the patient's satisfaction.     [name]

## 2021-10-30 NOTE — Progress Notes (Signed)
Surgical Instructions    Your procedure is scheduled on 11/02/21.  Report to [facility] Main Entrance "A" at 5:30 A.M., then check in with the Admitting office.  Call this number if you have problems the morning of surgery:  [phone]   If you have any questions prior to your surgery date call [phone]: Open Monday-Friday 8am-4pm    Remember:  Do not eat or drink after midnight the night before your surgery      Take these medicines the morning of surgery with A SIP OF WATER  buPROPion (WELLBUTRIN SR) carvedilol (COREG) digoxin (LANOXIN) diphenhydrAMINE (BENADRYL) if needed ivabradine (CORLANOR) midodrine (PROAMATINE)    As of today, STOP taking any Aspirin (unless otherwise instructed by your surgeon) Aleve, Naproxen, Ibuprofen, Motrin, Advil, Goody's, BC's, all herbal medications, fish oil, and all vitamins.  WHAT DO I DO ABOUT MY DIABETES MEDICATION?   Do not take oral diabetes medicines (pills) the morning of surgery.  THE DAY BEFORE SURGERY, do not take dapagliflozin propanediol (FARXIGA).     THE MORNING OF SURGERY, do not take dapagliflozin propanediol (FARXIGA).  The day of surgery, do not take other diabetes injectables, including Byetta (exenatide), Bydureon (exenatide ER), Victoza (liraglutide), or Trulicity (dulaglutide).  If your CBG is greater than 220 mg/dL, you may take  of your sliding scale (correction) dose of insulin.   HOW TO MANAGE YOUR DIABETES BEFORE AND AFTER SURGERY  Why is it important to control my blood sugar before and after surgery? Improving blood sugar levels before and after surgery helps healing and can limit problems. A way of improving blood sugar control is eating a healthy diet by:  Eating less sugar and carbohydrates  Increasing activity/exercise  Talking with your doctor about reaching your blood sugar goals High blood sugars (greater than 180 mg/dL) can raise your risk of infections and slow your recovery, so you will  need to focus on controlling your diabetes during the weeks before surgery. Make sure that the doctor who takes care of your diabetes knows about your planned surgery including the date and location.  How do I manage my blood sugar before surgery? Check your blood sugar at least 4 times a day, starting 2 days before surgery, to make sure that the level is not too high or low.  Check your blood sugar the morning of your surgery when you wake up and every 2 hours until you get to the Short Stay unit.  If your blood sugar is less than 70 mg/dL, you will need to treat for low blood sugar: Do not take insulin. Treat a low blood sugar (less than 70 mg/dL) with  cup of clear juice (cranberry or apple), 4 glucose tablets, OR glucose gel. Recheck blood sugar in 15 minutes after treatment (to make sure it is greater than 70 mg/dL). If your blood sugar is not greater than 70 mg/dL on recheck, call [phone] for further instructions. Report your blood sugar to the short stay nurse when you get to Short Stay.  If you are admitted to the hospital after surgery: Your blood sugar will be checked by the staff and you will probably be given insulin after surgery (instead of oral diabetes medicines) to make sure you have good blood sugar levels. The goal for blood sugar control after surgery is 80-180 mg/dL.      After your COVID test   You are not required to quarantine however you are required to wear a well-fitting mask when you are out and  around people not in your household.  If your mask becomes wet or soiled, replace with a new one.  Wash your hands often with soap and water for 20 seconds or clean your hands with an alcohol-based hand sanitizer that contains at least 60% alcohol.  Do not share personal items.  Notify your provider: if you are in close contact with someone who has COVID  or if you develop a fever of 100.4 or greater, sneezing, cough, sore throat, shortness of breath or body  aches.             Do not wear jewelry or makeup Do not wear lotions, powders, perfumes/colognes, or deodorant. Do not shave 48 hours prior to surgery.   Do not bring valuables to the hospital.  DO Not wear nail polish, gel polish, artificial nails, or any other type of covering on natural nails including finger and toenails. If patients have artificial nails, gel coating, etc. that need to be removed by a nail salon, please have this removed prior to surgery or surgery may need to be canceled/delayed if the surgeon/ anesthesia feels like the patient is unable to be adequately monitored.             [facility] is not responsible for any belongings or valuables.  Do NOT Smoke (Tobacco/Vaping)  24 hours prior to your procedure  If you use a CPAP at night, you may bring your mask for your overnight stay.   Contacts, glasses, hearing aids, dentures or partials may not be worn into surgery, please bring cases for these belongings   For patients admitted to the hospital, discharge time will be determined by your treatment team.   Patients discharged the day of surgery will not be allowed to drive home, and someone needs to stay with them for 24 hours.  NO VISITORS WILL BE ALLOWED IN PRE-OP WHERE PATIENTS ARE PREPPED FOR SURGERY.  ONLY 1 SUPPORT PERSON MAY BE PRESENT IN THE WAITING ROOM WHILE YOU ARE IN SURGERY.  IF YOU ARE TO BE ADMITTED, ONCE YOU ARE IN YOUR ROOM YOU WILL BE ALLOWED TWO (2) VISITORS. 1 (ONE) VISITOR MAY STAY OVERNIGHT BUT MUST ARRIVE TO THE ROOM BY 8pm.  Minor children may have two parents present. Special consideration for safety and communication needs will be reviewed on a case by case basis.  Special instructions:    Oral Hygiene is also important to reduce your risk of infection.  Remember - BRUSH YOUR TEETH THE MORNING OF SURGERY WITH YOUR REGULAR TOOTHPASTE   [place]- Preparing For Surgery  Before surgery, you can play an important role. Because skin is not  sterile, your skin needs to be as free of germs as possible. You can reduce the number of germs on your skin by washing with CHG (chlorahexidine gluconate) Soap before surgery.  CHG is an antiseptic cleaner which kills germs and bonds with the skin to continue killing germs even after washing.     Please do not use if you have an allergy to CHG or antibacterial soaps. If your skin becomes reddened/irritated stop using the CHG.  Do not shave (including legs and underarms) for at least 48 hours prior to first CHG shower. It is OK to shave your face.  Please follow these instructions carefully.     Shower the NIGHT BEFORE SURGERY and the MORNING OF SURGERY with CHG Soap.   If you chose to wash your hair, wash your hair first as usual with your normal  shampoo. After you shampoo, rinse your hair and body thoroughly to remove the shampoo.  Then [employer] and genitals (private parts) with your normal soap and rinse thoroughly to remove soap.  After that Use CHG Soap as you would any other liquid soap. You can apply CHG directly to the skin and wash gently with a scrungie or a clean washcloth.   Apply the CHG Soap to your body ONLY FROM THE NECK DOWN.  Do not use on open wounds or open sores. Avoid contact with your eyes, ears, mouth and genitals (private parts). Wash Face and genitals (private parts)  with your normal soap.   Wash thoroughly, paying special attention to the area where your surgery will be performed.  Thoroughly rinse your body with warm water from the neck down.  DO NOT shower/wash with your normal soap after using and rinsing off the CHG Soap.  Pat yourself dry with a CLEAN TOWEL.  Wear CLEAN PAJAMAS to bed the night before surgery  Place CLEAN SHEETS on your bed the night before your surgery  DO NOT SLEEP WITH PETS.   Day of Surgery: Take a shower with CHG soap. Wear Clean/Comfortable clothing the morning of surgery Do not apply any deodorants/lotions.   Remember to  brush your teeth WITH YOUR REGULAR TOOTHPASTE.   Please read over the following fact sheets that you were given.

## 2021-11-01 ENCOUNTER — Encounter (HOSPITAL_COMMUNITY): Payer: Self-pay | Admitting: Surgery

## 2021-11-01 MED ORDER — POTASSIUM CHLORIDE 2 MEQ/ML IV SOLN
80.0000 meq | INTRAVENOUS | Status: DC
  Filled 2021-11-01: qty 40

## 2021-11-01 MED ORDER — TRANEXAMIC ACID (OHS) BOLUS VIA INFUSION
15.0000 mg/kg | INTRAVENOUS | Status: AC
  Administered 2021-11-02: 987 mg via INTRAVENOUS
  Filled 2021-11-01: qty 987

## 2021-11-01 MED ORDER — TRANEXAMIC ACID 1000 MG/10ML IV SOLN
1.5000 mg/kg/h | INTRAVENOUS | Status: AC
  Administered 2021-11-02: 1.5 mg/kg/h via INTRAVENOUS
  Filled 2021-11-01: qty 25

## 2021-11-01 MED ORDER — MILRINONE LACTATE IN DEXTROSE 20-5 MG/100ML-% IV SOLN
0.3000 ug/kg/min | INTRAVENOUS | Status: AC
  Administered 2021-11-02: .125 ug/kg/min via INTRAVENOUS
  Filled 2021-11-01: qty 100

## 2021-11-01 MED ORDER — HEPARIN 30,000 UNITS/1000 ML (OHS) CELLSAVER SOLUTION
Status: DC
  Filled 2021-11-01: qty 1000

## 2021-11-01 MED ORDER — CEFAZOLIN SODIUM-DEXTROSE 2-4 GM/100ML-% IV SOLN
2.0000 g | INTRAVENOUS | Status: AC
  Administered 2021-11-02: 2 g via INTRAVENOUS
  Filled 2021-11-01: qty 100

## 2021-11-01 MED ORDER — DEXMEDETOMIDINE HCL IN NACL 400 MCG/100ML IV SOLN
0.1000 ug/kg/h | INTRAVENOUS | Status: AC
  Administered 2021-11-02: .3 ug/kg/h via INTRAVENOUS
  Filled 2021-11-01: qty 100

## 2021-11-01 MED ORDER — EPINEPHRINE HCL 5 MG/250ML IV SOLN IN NS
0.0000 ug/min | INTRAVENOUS | Status: AC
Start: 2021-11-02 — End: 2021-11-02
  Administered 2021-11-02: 2 ug/min via INTRAVENOUS
  Filled 2021-11-01: qty 250

## 2021-11-01 MED ORDER — INSULIN REGULAR(HUMAN) IN NACL 100-0.9 UT/100ML-% IV SOLN
INTRAVENOUS | Status: AC
  Administered 2021-11-02: .9 [IU]/h via INTRAVENOUS
  Filled 2021-11-01: qty 100

## 2021-11-01 MED ORDER — PHENYLEPHRINE HCL-NACL 20-0.9 MG/250ML-% IV SOLN
30.0000 ug/min | INTRAVENOUS | Status: AC
  Administered 2021-11-02: 50 ug/min via INTRAVENOUS
  Filled 2021-11-01: qty 250

## 2021-11-01 MED ORDER — MANNITOL 20 % IV SOLN
INTRAVENOUS | Status: DC
  Filled 2021-11-01: qty 13

## 2021-11-01 MED ORDER — VANCOMYCIN HCL 1250 MG/250ML IV SOLN
1250.0000 mg | INTRAVENOUS | Status: AC
  Administered 2021-11-02: 1250 mg via INTRAVENOUS
  Filled 2021-11-01: qty 250

## 2021-11-01 MED ORDER — TRANEXAMIC ACID (OHS) PUMP PRIME SOLUTION
2.0000 mg/kg | INTRAVENOUS | Status: DC
  Filled 2021-11-01: qty 1.32

## 2021-11-01 MED ORDER — NOREPINEPHRINE 4 MG/250ML-% IV SOLN
0.0000 ug/min | INTRAVENOUS | Status: DC
  Filled 2021-11-01: qty 250

## 2021-11-01 MED ORDER — NITROGLYCERIN IN D5W 200-5 MCG/ML-% IV SOLN
2.0000 ug/min | INTRAVENOUS | Status: DC
  Filled 2021-11-01: qty 250

## 2021-11-01 MED ORDER — PLASMA-LYTE A IV SOLN
INTRAVENOUS | Status: DC
  Filled 2021-11-01: qty 5

## 2021-11-02 ENCOUNTER — Other Ambulatory Visit: Payer: Self-pay

## 2021-11-02 ENCOUNTER — Inpatient Hospital Stay (HOSPITAL_COMMUNITY): Payer: No Typology Code available for payment source

## 2021-11-02 ENCOUNTER — Encounter (HOSPITAL_COMMUNITY)
Admission: RE | Disposition: A | Discharge status: Home / Self Care | Payer: Self-pay | Source: Home / Self Care | Attending: Surgery

## 2021-11-02 ENCOUNTER — Inpatient Hospital Stay (HOSPITAL_COMMUNITY): Payer: No Typology Code available for payment source | Admitting: Anesthesiology

## 2021-11-02 ENCOUNTER — Inpatient Hospital Stay (HOSPITAL_COMMUNITY)
Admission: RE | Admit: 2021-11-02 | Discharge: 2021-11-11 | DRG: 219 | Disposition: A | Discharge status: Home / Self Care | Payer: No Typology Code available for payment source | Attending: Surgery | Admitting: Surgery

## 2021-11-02 ENCOUNTER — Encounter (HOSPITAL_COMMUNITY): Payer: Self-pay | Admitting: Surgery

## 2021-11-02 DIAGNOSIS — Z79899 Other long term (current) drug therapy: Secondary | ICD-10-CM

## 2021-11-02 DIAGNOSIS — Z20822 Contact with and (suspected) exposure to covid-19: Secondary | ICD-10-CM | POA: Diagnosis present

## 2021-11-02 DIAGNOSIS — I97190 Other postprocedural cardiac functional disturbances following cardiac surgery: Secondary | ICD-10-CM | POA: Diagnosis not present

## 2021-11-02 DIAGNOSIS — I4892 Unspecified atrial flutter: Secondary | ICD-10-CM | POA: Diagnosis not present

## 2021-11-02 DIAGNOSIS — Y838 Other surgical procedures as the cause of abnormal reaction of the patient, or of later complication, without mention of misadventure at the time of the procedure: Secondary | ICD-10-CM | POA: Diagnosis not present

## 2021-11-02 DIAGNOSIS — Z8249 Family history of ischemic heart disease and other diseases of the circulatory system: Secondary | ICD-10-CM

## 2021-11-02 DIAGNOSIS — R031 Nonspecific low blood-pressure reading: Secondary | ICD-10-CM | POA: Diagnosis not present

## 2021-11-02 DIAGNOSIS — Z952 Presence of prosthetic heart valve: Secondary | ICD-10-CM | POA: Diagnosis not present

## 2021-11-02 DIAGNOSIS — D62 Acute posthemorrhagic anemia: Secondary | ICD-10-CM | POA: Diagnosis not present

## 2021-11-02 DIAGNOSIS — J9811 Atelectasis: Secondary | ICD-10-CM | POA: Diagnosis not present

## 2021-11-02 DIAGNOSIS — R0902 Hypoxemia: Secondary | ICD-10-CM | POA: Diagnosis present

## 2021-11-02 DIAGNOSIS — I110 Hypertensive heart disease with heart failure: Secondary | ICD-10-CM | POA: Diagnosis present

## 2021-11-02 DIAGNOSIS — I5023 Acute on chronic systolic (congestive) heart failure: Secondary | ICD-10-CM | POA: Diagnosis not present

## 2021-11-02 DIAGNOSIS — I5022 Chronic systolic (congestive) heart failure: Secondary | ICD-10-CM | POA: Diagnosis not present

## 2021-11-02 DIAGNOSIS — I480 Paroxysmal atrial fibrillation: Secondary | ICD-10-CM | POA: Diagnosis not present

## 2021-11-02 DIAGNOSIS — I2510 Atherosclerotic heart disease of native coronary artery without angina pectoris: Secondary | ICD-10-CM | POA: Diagnosis present

## 2021-11-02 DIAGNOSIS — F17210 Nicotine dependence, cigarettes, uncomplicated: Secondary | ICD-10-CM | POA: Diagnosis present

## 2021-11-02 DIAGNOSIS — I351 Nonrheumatic aortic (valve) insufficiency: Secondary | ICD-10-CM

## 2021-11-02 DIAGNOSIS — Z7984 Long term (current) use of oral hypoglycemic drugs: Secondary | ICD-10-CM | POA: Diagnosis not present

## 2021-11-02 DIAGNOSIS — I350 Nonrheumatic aortic (valve) stenosis: Secondary | ICD-10-CM | POA: Diagnosis not present

## 2021-11-02 DIAGNOSIS — Q2112 Patent foramen ovale: Secondary | ICD-10-CM

## 2021-11-02 DIAGNOSIS — Z9049 Acquired absence of other specified parts of digestive tract: Secondary | ICD-10-CM

## 2021-11-02 DIAGNOSIS — I484 Atypical atrial flutter: Secondary | ICD-10-CM | POA: Diagnosis not present

## 2021-11-02 DIAGNOSIS — Q231 Congenital insufficiency of aortic valve: Secondary | ICD-10-CM | POA: Diagnosis present

## 2021-11-02 DIAGNOSIS — I5082 Biventricular heart failure: Secondary | ICD-10-CM | POA: Diagnosis present

## 2021-11-02 DIAGNOSIS — I428 Other cardiomyopathies: Secondary | ICD-10-CM | POA: Diagnosis present

## 2021-11-02 HISTORY — PX: AORTIC VALVE REPLACEMENT: SHX41

## 2021-11-02 HISTORY — PX: REPAIR OF PATENT FORAMEN OVALE: SHX6064

## 2021-11-02 HISTORY — PX: TEE WITHOUT CARDIOVERSION: SHX5443

## 2021-11-02 LAB — POCT I-STAT 7, (LYTES, BLD GAS, ICA,H+H)
Acid-Base Excess: 2 mmol/L (ref 0.0–2.0)
Acid-Base Excess: 3 mmol/L — ABNORMAL HIGH (ref 0.0–2.0)
Acid-Base Excess: 2 mmol/L (ref 0.0–2.0)
Acid-Base Excess: 0 mmol/L (ref 0.0–2.0)
Acid-base deficit: 1 mmol/L (ref 0.0–2.0)
Acid-base deficit: 3 mmol/L — ABNORMAL HIGH (ref 0.0–2.0)
Acid-base deficit: 2 mmol/L (ref 0.0–2.0)
Bicarbonate: 26.4 mmol/L (ref 20.0–28.0)
Bicarbonate: 27.4 mmol/L (ref 20.0–28.0)
Bicarbonate: 26.2 mmol/L (ref 20.0–28.0)
Bicarbonate: 24.6 mmol/L (ref 20.0–28.0)
Bicarbonate: 23.7 mmol/L (ref 20.0–28.0)
Bicarbonate: 23.1 mmol/L (ref 20.0–28.0)
Bicarbonate: 25.5 mmol/L (ref 20.0–28.0)
Calcium, Ion: 1.01 mmol/L — ABNORMAL LOW (ref 1.15–1.40)
Calcium, Ion: 1.11 mmol/L — ABNORMAL LOW (ref 1.15–1.40)
Calcium, Ion: 1.09 mmol/L — ABNORMAL LOW (ref 1.15–1.40)
Calcium, Ion: 1.39 mmol/L (ref 1.15–1.40)
Calcium, Ion: 1.28 mmol/L (ref 1.15–1.40)
Calcium, Ion: 1.14 mmol/L — ABNORMAL LOW (ref 1.15–1.40)
Calcium, Ion: 1.21 mmol/L (ref 1.15–1.40)
HCT: 28 % — ABNORMAL LOW (ref 36.0–46.0)
HCT: 31 % — ABNORMAL LOW (ref 36.0–46.0)
HCT: 27 % — ABNORMAL LOW (ref 36.0–46.0)
HCT: 29 % — ABNORMAL LOW (ref 36.0–46.0)
HCT: 34 % — ABNORMAL LOW (ref 36.0–46.0)
HCT: 31 % — ABNORMAL LOW (ref 36.0–46.0)
HCT: 33 % — ABNORMAL LOW (ref 36.0–46.0)
Hemoglobin: 10.5 g/dL — ABNORMAL LOW (ref 12.0–15.0)
Hemoglobin: 9.5 g/dL — ABNORMAL LOW (ref 12.0–15.0)
Hemoglobin: 9.2 g/dL — ABNORMAL LOW (ref 12.0–15.0)
Hemoglobin: 9.9 g/dL — ABNORMAL LOW (ref 12.0–15.0)
Hemoglobin: 11.6 g/dL — ABNORMAL LOW (ref 12.0–15.0)
Hemoglobin: 10.5 g/dL — ABNORMAL LOW (ref 12.0–15.0)
Hemoglobin: 11.2 g/dL — ABNORMAL LOW (ref 12.0–15.0)
O2 Saturation: 100 %
O2 Saturation: 100 %
O2 Saturation: 100 %
O2 Saturation: 99 %
O2 Saturation: 97 %
O2 Saturation: 98 %
O2 Saturation: 98 %
Patient temperature: 36
Patient temperature: 37.1
Patient temperature: 36.5
Potassium: 3.8 mmol/L (ref 3.5–5.1)
Potassium: 4.4 mmol/L (ref 3.5–5.1)
Potassium: 5.2 mmol/L — ABNORMAL HIGH (ref 3.5–5.1)
Potassium: 4.6 mmol/L (ref 3.5–5.1)
Potassium: 4.3 mmol/L (ref 3.5–5.1)
Potassium: 3.8 mmol/L (ref 3.5–5.1)
Potassium: 4.3 mmol/L (ref 3.5–5.1)
Sodium: 138 mmol/L (ref 135–145)
Sodium: 139 mmol/L (ref 135–145)
Sodium: 136 mmol/L (ref 135–145)
Sodium: 138 mmol/L (ref 135–145)
Sodium: 139 mmol/L (ref 135–145)
Sodium: 142 mmol/L (ref 135–145)
Sodium: 140 mmol/L (ref 135–145)
TCO2: 27 mmol/L (ref 22–32)
TCO2: 29 mmol/L (ref 22–32)
TCO2: 27 mmol/L (ref 22–32)
TCO2: 26 mmol/L (ref 22–32)
TCO2: 25 mmol/L (ref 22–32)
TCO2: 24 mmol/L (ref 22–32)
TCO2: 27 mmol/L (ref 22–32)
pCO2 arterial: 34.9 mmHg (ref 32.0–48.0)
pCO2 arterial: 45.2 mmHg (ref 32.0–48.0)
pCO2 arterial: 38.1 mmHg (ref 32.0–48.0)
pCO2 arterial: 43 mmHg (ref 32.0–48.0)
pCO2 arterial: 45.4 mmHg (ref 32.0–48.0)
pCO2 arterial: 38.6 mmHg (ref 32.0–48.0)
pCO2 arterial: 43.5 mmHg (ref 32.0–48.0)
pH, Arterial: 7.391 (ref 7.350–7.450)
pH, Arterial: 7.487 — ABNORMAL HIGH (ref 7.350–7.450)
pH, Arterial: 7.446 (ref 7.350–7.450)
pH, Arterial: 7.365 (ref 7.350–7.450)
pH, Arterial: 7.32 — ABNORMAL LOW (ref 7.350–7.450)
pH, Arterial: 7.385 (ref 7.350–7.450)
pH, Arterial: 7.374 (ref 7.350–7.450)
pO2, Arterial: 261 mmHg — ABNORMAL HIGH (ref 83.0–108.0)
pO2, Arterial: 303 mmHg — ABNORMAL HIGH (ref 83.0–108.0)
pO2, Arterial: 252 mmHg — ABNORMAL HIGH (ref 83.0–108.0)
pO2, Arterial: 133 mmHg — ABNORMAL HIGH (ref 83.0–108.0)
pO2, Arterial: 97 mmHg (ref 83.0–108.0)
pO2, Arterial: 111 mmHg — ABNORMAL HIGH (ref 83.0–108.0)
pO2, Arterial: 101 mmHg (ref 83.0–108.0)

## 2021-11-02 LAB — GLUCOSE, CAPILLARY
Glucose-Capillary: 140 mg/dL — ABNORMAL HIGH (ref 70–99)
Glucose-Capillary: 167 mg/dL — ABNORMAL HIGH (ref 70–99)
Glucose-Capillary: 153 mg/dL — ABNORMAL HIGH (ref 70–99)
Glucose-Capillary: 153 mg/dL — ABNORMAL HIGH (ref 70–99)
Glucose-Capillary: 171 mg/dL — ABNORMAL HIGH (ref 70–99)
Glucose-Capillary: 140 mg/dL — ABNORMAL HIGH (ref 70–99)
Glucose-Capillary: 119 mg/dL — ABNORMAL HIGH (ref 70–99)

## 2021-11-02 LAB — POCT I-STAT, CHEM 8
BUN: 14 mg/dL (ref 6–20)
BUN: 15 mg/dL (ref 6–20)
BUN: 14 mg/dL (ref 6–20)
BUN: 13 mg/dL (ref 6–20)
BUN: 13 mg/dL (ref 6–20)
Calcium, Ion: 1.21 mmol/L (ref 1.15–1.40)
Calcium, Ion: 1.24 mmol/L (ref 1.15–1.40)
Calcium, Ion: 1.11 mmol/L — ABNORMAL LOW (ref 1.15–1.40)
Calcium, Ion: 1.1 mmol/L — ABNORMAL LOW (ref 1.15–1.40)
Calcium, Ion: 1.35 mmol/L (ref 1.15–1.40)
Chloride: 104 mmol/L (ref 98–111)
Chloride: 105 mmol/L (ref 98–111)
Chloride: 105 mmol/L (ref 98–111)
Chloride: 104 mmol/L (ref 98–111)
Chloride: 105 mmol/L (ref 98–111)
Creatinine, Ser: 0.7 mg/dL (ref 0.44–1.00)
Creatinine, Ser: 0.7 mg/dL (ref 0.44–1.00)
Creatinine, Ser: 0.6 mg/dL (ref 0.44–1.00)
Creatinine, Ser: 0.6 mg/dL (ref 0.44–1.00)
Creatinine, Ser: 0.7 mg/dL (ref 0.44–1.00)
Glucose, Bld: 103 mg/dL — ABNORMAL HIGH (ref 70–99)
Glucose, Bld: 118 mg/dL — ABNORMAL HIGH (ref 70–99)
Glucose, Bld: 117 mg/dL — ABNORMAL HIGH (ref 70–99)
Glucose, Bld: 119 mg/dL — ABNORMAL HIGH (ref 70–99)
Glucose, Bld: 141 mg/dL — ABNORMAL HIGH (ref 70–99)
HCT: 37 % (ref 36.0–46.0)
HCT: 37 % (ref 36.0–46.0)
HCT: 29 % — ABNORMAL LOW (ref 36.0–46.0)
HCT: 29 % — ABNORMAL LOW (ref 36.0–46.0)
HCT: 29 % — ABNORMAL LOW (ref 36.0–46.0)
Hemoglobin: 12.6 g/dL (ref 12.0–15.0)
Hemoglobin: 12.6 g/dL (ref 12.0–15.0)
Hemoglobin: 9.9 g/dL — ABNORMAL LOW (ref 12.0–15.0)
Hemoglobin: 9.9 g/dL — ABNORMAL LOW (ref 12.0–15.0)
Hemoglobin: 9.9 g/dL — ABNORMAL LOW (ref 12.0–15.0)
Potassium: 3.4 mmol/L — ABNORMAL LOW (ref 3.5–5.1)
Potassium: 3.7 mmol/L (ref 3.5–5.1)
Potassium: 4.4 mmol/L (ref 3.5–5.1)
Potassium: 5.1 mmol/L (ref 3.5–5.1)
Potassium: 4.7 mmol/L (ref 3.5–5.1)
Sodium: 140 mmol/L (ref 135–145)
Sodium: 140 mmol/L (ref 135–145)
Sodium: 139 mmol/L (ref 135–145)
Sodium: 136 mmol/L (ref 135–145)
Sodium: 138 mmol/L (ref 135–145)
TCO2: 26 mmol/L (ref 22–32)
TCO2: 26 mmol/L (ref 22–32)
TCO2: 27 mmol/L (ref 22–32)
TCO2: 26 mmol/L (ref 22–32)
TCO2: 24 mmol/L (ref 22–32)

## 2021-11-02 LAB — POCT I-STAT EG7
Acid-Base Excess: 2 mmol/L (ref 0.0–2.0)
Bicarbonate: 26.8 mmol/L (ref 20.0–28.0)
Calcium, Ion: 1.09 mmol/L — ABNORMAL LOW (ref 1.15–1.40)
HCT: 29 % — ABNORMAL LOW (ref 36.0–46.0)
Hemoglobin: 9.9 g/dL — ABNORMAL LOW (ref 12.0–15.0)
O2 Saturation: 79 %
Potassium: 3.9 mmol/L (ref 3.5–5.1)
Sodium: 139 mmol/L (ref 135–145)
TCO2: 28 mmol/L (ref 22–32)
pCO2, Ven: 44.1 mmHg (ref 44.0–60.0)
pH, Ven: 7.393 (ref 7.250–7.430)
pO2, Ven: 44 mmHg (ref 32.0–45.0)

## 2021-11-02 LAB — BASIC METABOLIC PANEL
Anion gap: 9 (ref 5–15)
BUN: 17 mg/dL (ref 6–20)
CO2: 23 mmol/L (ref 22–32)
Calcium: 8 mg/dL — ABNORMAL LOW (ref 8.9–10.3)
Chloride: 106 mmol/L (ref 98–111)
Creatinine, Ser: 0.8 mg/dL (ref 0.44–1.00)
GFR, Estimated: >60 mL/min (ref >60)
Glucose, Bld: 161 mg/dL — ABNORMAL HIGH (ref 70–99)
Potassium: 4 mmol/L (ref 3.5–5.1)
Sodium: 138 mmol/L (ref 135–145)

## 2021-11-02 LAB — CBC
HCT: 34.5 % — ABNORMAL LOW (ref 36.0–46.0)
HCT: 33.9 % — ABNORMAL LOW (ref 36.0–46.0)
Hemoglobin: 11.3 g/dL — ABNORMAL LOW (ref 12.0–15.0)
Hemoglobin: 11.1 g/dL — ABNORMAL LOW (ref 12.0–15.0)
MCH: 32.8 pg (ref 26.0–34.0)
MCH: 32.6 pg (ref 26.0–34.0)
MCHC: 32.8 g/dL (ref 30.0–36.0)
MCHC: 32.7 g/dL (ref 30.0–36.0)
MCV: 100.3 fL — ABNORMAL HIGH (ref 80.0–100.0)
MCV: 99.4 fL (ref 80.0–100.0)
Platelets: 146 10*3/uL — ABNORMAL LOW (ref 150–400)
Platelets: 153 10*3/uL (ref 150–400)
RBC: 3.44 MIL/uL — ABNORMAL LOW (ref 3.87–5.11)
RBC: 3.41 MIL/uL — ABNORMAL LOW (ref 3.87–5.11)
RDW: 12.6 % (ref 11.5–15.5)
RDW: 12.6 % (ref 11.5–15.5)
WBC: 11.1 10*3/uL — ABNORMAL HIGH (ref 4.0–10.5)
WBC: 10.5 10*3/uL (ref 4.0–10.5)
nRBC: 0 % (ref 0.0–0.2)
nRBC: 0 % (ref 0.0–0.2)

## 2021-11-02 LAB — PLATELET COUNT: Platelets: 151 10*3/uL (ref 150–400)

## 2021-11-02 LAB — MAGNESIUM: Magnesium: 3 mg/dL — ABNORMAL HIGH (ref 1.7–2.4)

## 2021-11-02 LAB — PROTIME-INR
INR: 1.4 — ABNORMAL HIGH (ref 0.8–1.2)
Prothrombin Time: 16.9 seconds — ABNORMAL HIGH (ref 11.4–15.2)

## 2021-11-02 LAB — HEMOGLOBIN AND HEMATOCRIT, BLOOD
HCT: 30.3 % — ABNORMAL LOW (ref 36.0–46.0)
Hemoglobin: 10.4 g/dL — ABNORMAL LOW (ref 12.0–15.0)

## 2021-11-02 LAB — POCT PREGNANCY, URINE: Preg Test, Ur: NEGATIVE

## 2021-11-02 LAB — APTT: aPTT: 34 seconds (ref 24–36)

## 2021-11-02 SURGERY — REPLACEMENT, AORTIC VALVE, OPEN
Anesthesia: General | Site: Chest

## 2021-11-02 MED ORDER — LACTATED RINGERS IV SOLN: INTRAVENOUS | Status: DC | PRN

## 2021-11-02 MED ORDER — METOCLOPRAMIDE HCL 5 MG/ML IJ SOLN
10.0000 mg | Freq: Four times a day (QID) | INTRAMUSCULAR | Status: AC
  Administered 2021-11-02 – 2021-11-03 (×3): 10 mg via INTRAVENOUS
  Filled 2021-11-02 (×3): qty 2

## 2021-11-02 MED ORDER — METOPROLOL TARTRATE 25 MG/10 ML ORAL SUSPENSION: 12.5000 mg | Freq: Two times a day (BID) | ORAL | Status: DC

## 2021-11-02 MED ORDER — SODIUM CHLORIDE 0.9% FLUSH: 10.0000 mL | INTRAVENOUS | Status: DC | PRN

## 2021-11-02 MED ORDER — PHENYLEPHRINE HCL (PRESSORS) 10 MG/ML IV SOLN
INTRAVENOUS | Status: DC | PRN
  Administered 2021-11-02 (×2): 80 ug via INTRAVENOUS

## 2021-11-02 MED ORDER — PANTOPRAZOLE SODIUM 40 MG PO TBEC
40.0000 mg | DELAYED_RELEASE_TABLET | Freq: Every day | ORAL | Status: DC
  Administered 2021-11-04 – 2021-11-11 (×8): 40 mg via ORAL
  Filled 2021-11-02 (×8): qty 1

## 2021-11-02 MED ORDER — ORAL CARE MOUTH RINSE
15.0000 mL | OROMUCOSAL | Status: DC
  Administered 2021-11-02: 15 mL via OROMUCOSAL

## 2021-11-02 MED ORDER — SODIUM CHLORIDE 0.9% FLUSH
10.0000 mL | Freq: Two times a day (BID) | INTRAVENOUS | Status: DC
Start: 2021-11-02 — End: 2021-11-08
  Administered 2021-11-02 – 2021-11-08 (×13): 10 mL

## 2021-11-02 MED ORDER — HEMOSTATIC AGENTS (NO CHARGE) OPTIME
TOPICAL | Status: DC | PRN
  Administered 2021-11-02: 1 via TOPICAL

## 2021-11-02 MED ORDER — OXYCODONE HCL 5 MG PO TABS
5.0000 mg | ORAL_TABLET | ORAL | Status: DC | PRN
  Administered 2021-11-02 – 2021-11-10 (×8): 10 mg via ORAL
  Filled 2021-11-02 (×8): qty 2

## 2021-11-02 MED ORDER — ALBUMIN HUMAN 5 % IV SOLN: INTRAVENOUS | Status: DC | PRN

## 2021-11-02 MED ORDER — MIDAZOLAM HCL 2 MG/2ML IJ SOLN
2.0000 mg | INTRAMUSCULAR | Status: DC | PRN
  Administered 2021-11-02: 2 mg via INTRAVENOUS
  Filled 2021-11-02: qty 2

## 2021-11-02 MED ORDER — METOPROLOL TARTRATE 12.5 MG HALF TABLET: 12.5000 mg | ORAL_TABLET | Freq: Two times a day (BID) | ORAL | Status: DC

## 2021-11-02 MED ORDER — LIDOCAINE 2% (20 MG/ML) 5 ML SYRINGE
INTRAMUSCULAR | Status: AC
  Filled 2021-11-02: qty 5

## 2021-11-02 MED ORDER — FENTANYL CITRATE (PF) 250 MCG/5ML IJ SOLN
INTRAMUSCULAR | Status: DC | PRN
  Administered 2021-11-02 (×2): 100 ug via INTRAVENOUS
  Administered 2021-11-02: 200 ug via INTRAVENOUS
  Administered 2021-11-02 (×2): 50 ug via INTRAVENOUS
  Administered 2021-11-02: 100 ug via INTRAVENOUS
  Administered 2021-11-02: 50 ug via INTRAVENOUS

## 2021-11-02 MED ORDER — HEPARIN SODIUM (PORCINE) 1000 UNIT/ML IJ SOLN
INTRAMUSCULAR | Status: DC | PRN
  Administered 2021-11-02: 21000 [IU] via INTRAVENOUS

## 2021-11-02 MED ORDER — METOPROLOL TARTRATE 12.5 MG HALF TABLET: 12.5000 mg | ORAL_TABLET | Freq: Once | ORAL | Status: DC

## 2021-11-02 MED ORDER — POTASSIUM CHLORIDE 10 MEQ/50ML IV SOLN: 10.0000 meq | INTRAVENOUS | Status: AC

## 2021-11-02 MED ORDER — SODIUM BICARBONATE 8.4 % IV SOLN
50.0000 meq | Freq: Once | INTRAVENOUS | Status: AC
  Administered 2021-11-02: 50 meq via INTRAVENOUS

## 2021-11-02 MED ORDER — EPINEPHRINE HCL 5 MG/250ML IV SOLN IN NS
0.0000 ug/min | INTRAVENOUS | Status: DC
Start: 2021-11-02 — End: 2021-11-04

## 2021-11-02 MED ORDER — SODIUM CHLORIDE 0.9 % IV SOLN: 250.0000 mL | INTRAVENOUS | Status: DC

## 2021-11-02 MED ORDER — VANCOMYCIN HCL IN DEXTROSE 1-5 GM/200ML-% IV SOLN
1000.0000 mg | Freq: Once | INTRAVENOUS | Status: AC
  Administered 2021-11-02: 1000 mg via INTRAVENOUS
  Filled 2021-11-02: qty 200

## 2021-11-02 MED ORDER — ROCURONIUM BROMIDE 10 MG/ML (PF) SYRINGE
PREFILLED_SYRINGE | INTRAVENOUS | Status: DC | PRN
Start: 2021-11-02 — End: 2021-11-02
  Administered 2021-11-02 (×2): 20 mg via INTRAVENOUS
  Administered 2021-11-02: 50 mg via INTRAVENOUS
  Administered 2021-11-02: 30 mg via INTRAVENOUS
  Administered 2021-11-02: 70 mg via INTRAVENOUS

## 2021-11-02 MED ORDER — ACETAMINOPHEN 160 MG/5ML PO SOLN: 650.0000 mg | Freq: Once | ORAL | Status: AC

## 2021-11-02 MED ORDER — PROTAMINE SULFATE 10 MG/ML IV SOLN
INTRAVENOUS | Status: DC | PRN
  Administered 2021-11-02: 200 mg via INTRAVENOUS

## 2021-11-02 MED ORDER — ASPIRIN EC 325 MG PO TBEC
325.0000 mg | DELAYED_RELEASE_TABLET | Freq: Every day | ORAL | Status: DC
  Administered 2021-11-03 – 2021-11-08 (×6): 325 mg via ORAL
  Filled 2021-11-02 (×6): qty 1

## 2021-11-02 MED ORDER — LACTATED RINGERS IV SOLN: INTRAVENOUS | Status: DC

## 2021-11-02 MED ORDER — DEXTROSE 50 % IV SOLN: 0.0000 mL | INTRAVENOUS | Status: DC | PRN

## 2021-11-02 MED ORDER — CHLORHEXIDINE GLUCONATE 0.12 % MT SOLN
15.0000 mL | Freq: Two times a day (BID) | OROMUCOSAL | Status: DC
  Administered 2021-11-02 – 2021-11-11 (×15): 15 mL via OROMUCOSAL
  Filled 2021-11-02 (×12): qty 15

## 2021-11-02 MED ORDER — 0.9 % SODIUM CHLORIDE (POUR BTL) OPTIME
TOPICAL | Status: DC | PRN
  Administered 2021-11-02: 5000 mL

## 2021-11-02 MED ORDER — CHLORHEXIDINE GLUCONATE 4 % EX LIQD: 30.0000 mL | CUTANEOUS | Status: DC

## 2021-11-02 MED ORDER — CHLORHEXIDINE GLUCONATE 0.12 % MT SOLN: 15.0000 mL | Freq: Once | OROMUCOSAL | Status: AC

## 2021-11-02 MED ORDER — CHLORHEXIDINE GLUCONATE 0.12% ORAL RINSE (MEDLINE KIT): 15.0000 mL | Freq: Two times a day (BID) | OROMUCOSAL | Status: DC

## 2021-11-02 MED ORDER — PROPOFOL 10 MG/ML IV BOLUS
INTRAVENOUS | Status: AC
  Filled 2021-11-02: qty 20

## 2021-11-02 MED ORDER — ACETAMINOPHEN 160 MG/5ML PO SOLN: 1000.0000 mg | Freq: Four times a day (QID) | ORAL | Status: AC

## 2021-11-02 MED ORDER — SODIUM CHLORIDE 0.9% IV SOLUTION: Freq: Once | INTRAVENOUS | Status: AC

## 2021-11-02 MED ORDER — SODIUM CHLORIDE 0.45 % IV SOLN: INTRAVENOUS | Status: DC | PRN

## 2021-11-02 MED ORDER — BISACODYL 10 MG RE SUPP: 10.0000 mg | Freq: Every day | RECTAL | Status: DC

## 2021-11-02 MED ORDER — PLASMA-LYTE A IV SOLN
INTRAVENOUS | Status: DC | PRN
  Administered 2021-11-02: 500 mL

## 2021-11-02 MED ORDER — DOCUSATE SODIUM 100 MG PO CAPS
200.0000 mg | ORAL_CAPSULE | Freq: Every day | ORAL | Status: DC
  Administered 2021-11-03 – 2021-11-08 (×5): 200 mg via ORAL
  Filled 2021-11-02 (×5): qty 2

## 2021-11-02 MED ORDER — BISACODYL 5 MG PO TBEC
10.0000 mg | DELAYED_RELEASE_TABLET | Freq: Every day | ORAL | Status: DC
  Administered 2021-11-03 – 2021-11-06 (×3): 10 mg via ORAL
  Filled 2021-11-02 (×4): qty 2

## 2021-11-02 MED ORDER — SODIUM CHLORIDE 0.9% FLUSH: 3.0000 mL | INTRAVENOUS | Status: DC | PRN

## 2021-11-02 MED ORDER — PHENYLEPHRINE HCL-NACL 20-0.9 MG/250ML-% IV SOLN: 0.0000 ug/min | INTRAVENOUS | Status: DC

## 2021-11-02 MED ORDER — SODIUM CHLORIDE 0.9 % IV SOLN: INTRAVENOUS | Status: DC | PRN

## 2021-11-02 MED ORDER — DEXMEDETOMIDINE HCL IN NACL 400 MCG/100ML IV SOLN
0.0000 ug/kg/h | INTRAVENOUS | Status: DC
  Administered 2021-11-02: 0.2 ug/kg/h via INTRAVENOUS
  Filled 2021-11-02: qty 100

## 2021-11-02 MED ORDER — METOPROLOL TARTRATE 5 MG/5ML IV SOLN: 2.5000 mg | INTRAVENOUS | Status: DC | PRN

## 2021-11-02 MED ORDER — SODIUM CHLORIDE 0.9% FLUSH
3.0000 mL | Freq: Two times a day (BID) | INTRAVENOUS | Status: DC
  Administered 2021-11-04 – 2021-11-08 (×5): 3 mL via INTRAVENOUS

## 2021-11-02 MED ORDER — ONDANSETRON HCL 4 MG/2ML IJ SOLN
4.0000 mg | Freq: Four times a day (QID) | INTRAMUSCULAR | Status: DC | PRN
  Administered 2021-11-03 – 2021-11-10 (×6): 4 mg via INTRAVENOUS
  Filled 2021-11-02 (×6): qty 2

## 2021-11-02 MED ORDER — ETOMIDATE 2 MG/ML IV SOLN
INTRAVENOUS | Status: AC
  Filled 2021-11-02: qty 10

## 2021-11-02 MED ORDER — MAGNESIUM SULFATE 4 GM/100ML IV SOLN
4.0000 g | Freq: Once | INTRAVENOUS | Status: AC
  Administered 2021-11-02: 4 g via INTRAVENOUS
  Filled 2021-11-02: qty 100

## 2021-11-02 MED ORDER — CHLORHEXIDINE GLUCONATE 0.12 % MT SOLN
15.0000 mL | OROMUCOSAL | Status: AC
  Administered 2021-11-02: 15 mL via OROMUCOSAL

## 2021-11-02 MED ORDER — ROCURONIUM BROMIDE 10 MG/ML (PF) SYRINGE
PREFILLED_SYRINGE | INTRAVENOUS | Status: AC
  Filled 2021-11-02: qty 10

## 2021-11-02 MED ORDER — CEFAZOLIN SODIUM-DEXTROSE 2-4 GM/100ML-% IV SOLN
2.0000 g | Freq: Three times a day (TID) | INTRAVENOUS | Status: AC
  Administered 2021-11-02 – 2021-11-04 (×6): 2 g via INTRAVENOUS
  Filled 2021-11-02 (×6): qty 100

## 2021-11-02 MED ORDER — ACETAMINOPHEN 500 MG PO TABS
1000.0000 mg | ORAL_TABLET | Freq: Four times a day (QID) | ORAL | Status: AC
  Administered 2021-11-03 – 2021-11-07 (×19): 1000 mg via ORAL
  Filled 2021-11-02 (×19): qty 2

## 2021-11-02 MED ORDER — NITROGLYCERIN IN D5W 200-5 MCG/ML-% IV SOLN
0.0000 ug/min | INTRAVENOUS | Status: DC
Start: 2021-11-02 — End: 2021-11-04

## 2021-11-02 MED ORDER — MIDAZOLAM HCL 5 MG/5ML IJ SOLN
INTRAMUSCULAR | Status: DC | PRN
  Administered 2021-11-02: 2 mg via INTRAVENOUS
  Administered 2021-11-02: 1 mg via INTRAVENOUS
  Administered 2021-11-02: 2 mg via INTRAVENOUS
  Administered 2021-11-02: 1 mg via INTRAVENOUS
  Administered 2021-11-02: 2 mg via INTRAVENOUS
  Administered 2021-11-02 (×2): 1 mg via INTRAVENOUS

## 2021-11-02 MED ORDER — ALBUMIN HUMAN 5 % IV SOLN
250.0000 mL | INTRAVENOUS | Status: DC | PRN
  Administered 2021-11-03: 12.5 g via INTRAVENOUS

## 2021-11-02 MED ORDER — MORPHINE SULFATE (PF) 2 MG/ML IV SOLN
1.0000 mg | INTRAVENOUS | Status: DC | PRN
  Administered 2021-11-02: 4 mg via INTRAVENOUS
  Administered 2021-11-02: 2 mg via INTRAVENOUS
  Administered 2021-11-02 – 2021-11-03 (×3): 4 mg via INTRAVENOUS
  Administered 2021-11-03: 2 mg via INTRAVENOUS
  Filled 2021-11-02 (×2): qty 2
  Filled 2021-11-02 (×2): qty 1
  Filled 2021-11-02 (×2): qty 2

## 2021-11-02 MED ORDER — FENTANYL CITRATE (PF) 250 MCG/5ML IJ SOLN
INTRAMUSCULAR | Status: AC
  Filled 2021-11-02: qty 25

## 2021-11-02 MED ORDER — FAMOTIDINE IN NACL 20-0.9 MG/50ML-% IV SOLN
20.0000 mg | Freq: Two times a day (BID) | INTRAVENOUS | Status: DC
  Administered 2021-11-02: 20 mg via INTRAVENOUS
  Filled 2021-11-02: qty 50

## 2021-11-02 MED ORDER — BUPROPION HCL ER (SR) 150 MG PO TB12
150.0000 mg | ORAL_TABLET | Freq: Two times a day (BID) | ORAL | Status: DC
  Administered 2021-11-02 – 2021-11-11 (×18): 150 mg via ORAL
  Filled 2021-11-02 (×19): qty 1

## 2021-11-02 MED ORDER — CHLORHEXIDINE GLUCONATE 0.12 % MT SOLN
OROMUCOSAL | Status: AC
  Administered 2021-11-02: 15 mL via OROMUCOSAL
  Filled 2021-11-02: qty 15

## 2021-11-02 MED ORDER — MILRINONE LACTATE IN DEXTROSE 20-5 MG/100ML-% IV SOLN: 0.3000 ug/kg/min | INTRAVENOUS | Status: DC

## 2021-11-02 MED ORDER — CHLORHEXIDINE GLUCONATE CLOTH 2 % EX PADS
6.0000 | MEDICATED_PAD | Freq: Every day | CUTANEOUS | Status: DC
  Administered 2021-11-02 – 2021-11-08 (×6): 6 via TOPICAL

## 2021-11-02 MED ORDER — ACETAMINOPHEN 650 MG RE SUPP
650.0000 mg | Freq: Once | RECTAL | Status: AC
  Administered 2021-11-02: 650 mg via RECTAL

## 2021-11-02 MED ORDER — THROMBIN (RECOMBINANT) 20000 UNITS EX SOLR
CUTANEOUS | Status: AC
  Filled 2021-11-02: qty 20000

## 2021-11-02 MED ORDER — LACTATED RINGERS IV SOLN: 500.0000 mL | Freq: Once | INTRAVENOUS | Status: DC | PRN

## 2021-11-02 MED ORDER — TRAMADOL HCL 50 MG PO TABS
50.0000 mg | ORAL_TABLET | ORAL | Status: DC | PRN
  Administered 2021-11-02 – 2021-11-04 (×9): 100 mg via ORAL
  Administered 2021-11-04: 50 mg via ORAL
  Administered 2021-11-05 – 2021-11-11 (×13): 100 mg via ORAL
  Filled 2021-11-02 (×19): qty 2
  Filled 2021-11-02: qty 1
  Filled 2021-11-02 (×3): qty 2

## 2021-11-02 MED ORDER — ~~LOC~~ CARDIAC SURGERY, PATIENT & FAMILY EDUCATION
Freq: Once | Status: DC
  Filled 2021-11-02: qty 1

## 2021-11-02 MED ORDER — SODIUM CHLORIDE 0.9 % IV SOLN: INTRAVENOUS | Status: DC

## 2021-11-02 MED ORDER — PROPOFOL 10 MG/ML IV BOLUS
INTRAVENOUS | Status: DC | PRN
  Administered 2021-11-02: 20 mg via INTRAVENOUS
  Administered 2021-11-02: 30 mg via INTRAVENOUS
  Administered 2021-11-02: 50 mg via INTRAVENOUS
  Administered 2021-11-02 (×3): 10 mg via INTRAVENOUS

## 2021-11-02 MED ORDER — ORAL CARE MOUTH RINSE
15.0000 mL | Freq: Two times a day (BID) | OROMUCOSAL | Status: DC
  Administered 2021-11-03 – 2021-11-07 (×6): 15 mL via OROMUCOSAL

## 2021-11-02 MED ORDER — THROMBIN 20000 UNITS EX SOLR
OROMUCOSAL | Status: DC | PRN
  Administered 2021-11-02 (×3): 4 mL via TOPICAL

## 2021-11-02 MED ORDER — ASPIRIN 81 MG PO CHEW: 324.0000 mg | CHEWABLE_TABLET | Freq: Every day | ORAL | Status: DC

## 2021-11-02 MED ORDER — MIDAZOLAM HCL (PF) 10 MG/2ML IJ SOLN
INTRAMUSCULAR | Status: AC
  Filled 2021-11-02: qty 2

## 2021-11-02 MED ORDER — INSULIN REGULAR(HUMAN) IN NACL 100-0.9 UT/100ML-% IV SOLN: INTRAVENOUS | Status: DC

## 2021-11-02 MED FILL — Heparin Sodium (Porcine) Inj 1000 Unit/ML: Qty: 1000 | Status: AC

## 2021-11-02 MED FILL — Lidocaine HCl Local Preservative Free (PF) Inj 2%: INTRAMUSCULAR | Qty: 15 | Status: AC

## 2021-11-02 MED FILL — Potassium Chloride Inj 2 mEq/ML: INTRAVENOUS | Qty: 40 | Status: AC

## 2021-11-02 SURGICAL SUPPLY — 89 items
ADAPTER CARDIO PERF ANTE/RETRO (ADAPTER) ×5 IMPLANT
APPLICATOR COTTON TIP 6 STRL (MISCELLANEOUS) ×3 IMPLANT
APPLICATOR COTTON TIP 6IN STRL (MISCELLANEOUS) ×5
BAG DECANTER FOR FLEXI CONT (MISCELLANEOUS) ×5 IMPLANT
BLADE STERNUM SYSTEM 6 (BLADE) ×5 IMPLANT
BLADE SURG 15 STRL LF DISP TIS (BLADE) ×3 IMPLANT
BLADE SURG 15 STRL SS (BLADE) ×2
CANISTER SUCT 3000ML PPV (MISCELLANEOUS) ×5 IMPLANT
CANN PRFSN 3/8XCNCT ST RT ANG (MISCELLANEOUS) ×3
CANNULA GUNDRY RCSP 15FR (MISCELLANEOUS) ×5 IMPLANT
CANNULA PRFSN 3/8XCNCT RT ANG (MISCELLANEOUS) ×3 IMPLANT
CANNULA VEN MTL TIP RT (MISCELLANEOUS) ×2
CANNULA VRC MALB SNGL STG 36FR (MISCELLANEOUS) ×3 IMPLANT
CATH HEART VENT LEFT (CATHETERS) ×3 IMPLANT
CATH ROBINSON RED A/P 18FR (CATHETERS) ×20 IMPLANT
CATH THORACIC 36FR (CATHETERS) ×5 IMPLANT
CATH THORACIC 36FR RT ANG (CATHETERS) ×5 IMPLANT
CNTNR URN SCR LID CUP LEK RST (MISCELLANEOUS) ×3 IMPLANT
CONN 1/2X1/2X1/2  BEN (MISCELLANEOUS) ×2
CONN 1/2X1/2X1/2 BEN (MISCELLANEOUS) ×3 IMPLANT
CONN ST 3/8 X 1/2 (MISCELLANEOUS) ×5 IMPLANT
CONT SPEC 4OZ STRL OR WHT (MISCELLANEOUS) ×2
COVER SURGICAL LIGHT HANDLE (MISCELLANEOUS) ×5 IMPLANT
DEVICE SUT CK QUICK LOAD MINI (Prosthesis & Implant Heart) ×4 IMPLANT
DRAPE CARDIOVASCULAR INCISE (DRAPES) ×2
DRAPE SRG 135X102X78XABS (DRAPES) ×3 IMPLANT
DRAPE WARM FLUID 44X44 (DRAPES) ×5 IMPLANT
DRSG COVADERM 4X14 (GAUZE/BANDAGES/DRESSINGS) ×5 IMPLANT
ELECT CAUTERY BLADE 6.4 (BLADE) ×5 IMPLANT
ELECT REM PT RETURN 9FT ADLT (ELECTROSURGICAL) ×10
ELECTRODE REM PT RTRN 9FT ADLT (ELECTROSURGICAL) ×6 IMPLANT
FELT TEFLON 1X6 (MISCELLANEOUS) ×5 IMPLANT
GAUZE 4X4 16PLY ~~LOC~~+RFID DBL (SPONGE) ×5 IMPLANT
GAUZE SPONGE 4X4 12PLY STRL (GAUZE/BANDAGES/DRESSINGS) ×5 IMPLANT
GAUZE SPONGE 4X4 12PLY STRL LF (GAUZE/BANDAGES/DRESSINGS) ×5 IMPLANT
GLOVE SURG ENC MOIS LTX SZ6 (GLOVE) IMPLANT
GLOVE SURG ENC MOIS LTX SZ6.5 (GLOVE) IMPLANT
GLOVE SURG ENC MOIS LTX SZ7 (GLOVE) IMPLANT
GLOVE SURG ENC MOIS LTX SZ7.5 (GLOVE) IMPLANT
GLOVE SURG MICRO LTX SZ7 (GLOVE) ×10 IMPLANT
GLOVE SURG UNDER POLY LF SZ9 (GLOVE) ×5 IMPLANT
GOWN STRL REUS W/ TWL LRG LVL3 (GOWN DISPOSABLE) ×21 IMPLANT
GOWN STRL REUS W/ TWL XL LVL3 (GOWN DISPOSABLE) ×3 IMPLANT
GOWN STRL REUS W/TWL LRG LVL3 (GOWN DISPOSABLE) ×14
GOWN STRL REUS W/TWL XL LVL3 (GOWN DISPOSABLE) ×2
HEMOSTAT POWDER SURGIFOAM 1G (HEMOSTASIS) ×15 IMPLANT
HEMOSTAT SURGICEL 2X14 (HEMOSTASIS) ×5 IMPLANT
KIT BASIN OR (CUSTOM PROCEDURE TRAY) ×5 IMPLANT
KIT CATH CPB BARTLE (MISCELLANEOUS) ×5 IMPLANT
KIT COMBO MINI 4X17COR-KNOT (Prosthesis & Implant Heart) ×1 IMPLANT
KIT SUCTION CATH 14FR (SUCTIONS) ×5 IMPLANT
KIT SUT CK MINI COMBO 4X17 (Prosthesis & Implant Heart) ×4 IMPLANT
KIT TURNOVER KIT B (KITS) ×5 IMPLANT
LINE VENT (MISCELLANEOUS) ×5 IMPLANT
LOAD QUICK .035MM CK MINI (Prosthesis & Implant Heart) ×1 IMPLANT
LOOP VESSEL SUPERMAXI WHITE (MISCELLANEOUS) ×5 IMPLANT
NS IRRIG 1000ML POUR BTL (IV SOLUTION) ×30 IMPLANT
PACK E OPEN HEART (SUTURE) ×5 IMPLANT
PACK OPEN HEART (CUSTOM PROCEDURE TRAY) ×5 IMPLANT
PAD ARMBOARD 7.5X6 YLW CONV (MISCELLANEOUS) ×10 IMPLANT
POSITIONER HEAD DONUT 9IN (MISCELLANEOUS) ×5 IMPLANT
SET MPS 3-ND DEL (MISCELLANEOUS) ×5 IMPLANT
SPONGE T-LAP 18X18 ~~LOC~~+RFID (SPONGE) ×20 IMPLANT
SPONGE T-LAP 4X18 ~~LOC~~+RFID (SPONGE) ×5 IMPLANT
SUT BONE WAX W31G (SUTURE) ×5 IMPLANT
SUT EB EXC GRN/WHT 2-0 V-5 (SUTURE) ×10 IMPLANT
SUT ETHIBON EXCEL 2-0 V-5 (SUTURE) ×10 IMPLANT
SUT ETHIBOND 2 0 SH (SUTURE) ×4
SUT ETHIBOND 2 0 SH 36X2 (SUTURE) ×6 IMPLANT
SUT ETHIBOND 2 0 V5 (SUTURE) ×10 IMPLANT
SUT ETHIBOND V-5 VALVE (SUTURE) ×5 IMPLANT
SUT PROLENE 3 0 SH DA (SUTURE) ×5 IMPLANT
SUT PROLENE 3 0 SH1 36 (SUTURE) ×20 IMPLANT
SUT PROLENE 4 0 RB 1 (SUTURE) ×16
SUT PROLENE 4-0 RB1 .5 CRCL 36 (SUTURE) ×24 IMPLANT
SUT STEEL 6MS V (SUTURE) ×10 IMPLANT
SUT VIC AB 1 CTX 36 (SUTURE) ×6
SUT VIC AB 1 CTX36XBRD ANBCTR (SUTURE) ×9 IMPLANT
SYSTEM SAHARA CHEST DRAIN ATS (WOUND CARE) ×5 IMPLANT
TAPE CLOTH SURG 4X10 WHT LF (GAUZE/BANDAGES/DRESSINGS) ×5 IMPLANT
TAPE PAPER 2X10 WHT MICROPORE (GAUZE/BANDAGES/DRESSINGS) ×5 IMPLANT
TOWEL GREEN STERILE (TOWEL DISPOSABLE) ×5 IMPLANT
TOWEL GREEN STERILE FF (TOWEL DISPOSABLE) ×5 IMPLANT
TRAY FOLEY SLVR 16FR TEMP STAT (SET/KITS/TRAYS/PACK) ×5 IMPLANT
UNDERPAD 30X36 HEAVY ABSORB (UNDERPADS AND DIAPERS) ×5 IMPLANT
VALVE AORTIC SZ25 INSP/RESIL (Prosthesis & Implant Heart) ×5 IMPLANT
VENT LEFT HEART 12002 (CATHETERS) ×5
VRC MALLEABLE SINGLE STG 36FR (MISCELLANEOUS) ×5
WATER STERILE IRR 1000ML POUR (IV SOLUTION) ×10 IMPLANT

## 2021-11-02 NOTE — Transfer of Care (Signed)
Immediate Anesthesia Transfer of Care Note  Patient: Pamela Bryan  Procedure(s) Performed: AORTIC VALVE REPLACEMENT (AVR) USING INSPIRIS VALVE SIZE (Chest) TRANSESOPHAGEAL ECHOCARDIOGRAM (TEE) APPLICATION OF CELL SAVER REPAIR OF PATENT FORAMEN OVALE  Patient Location: ICU  Anesthesia Type:General  Level of Consciousness: Patient remains intubated per anesthesia plan  Airway & Oxygen Therapy: Patient remains intubated per anesthesia plan and Patient placed on Ventilator (see vital sign flow sheet for setting)  Post-op Assessment: Report given to RN and Post -op Vital signs reviewed and stable  Post vital signs: Reviewed and stable  Last Vitals:  Vitals Value Taken Time  BP 92/62   Temp    Pulse 80 11/02/21 1259  Resp 17 11/02/21 1259  SpO2 95 % 11/02/21 1259  Vitals shown include unvalidated device data.  Last Pain:  Vitals:   11/02/21 0622  TempSrc:   PainSc: 0-No pain         Complications: No notable events documented.

## 2021-11-02 NOTE — Hospital Course (Addendum)
History of Present Illness:  The patient is a 50 year old woman with a history of alcohol and tobacco abuse who was admitted in March 2022 with acute systolic congestive heart failure with worsening bilateral lower extremity edema, 10 to 15 pound weight gain, and pulmonary edema with bilateral pleural effusions on chest x-ray.  She related that her symptoms began in January with orthopnea and PND and progressed to lower extremity edema few weeks prior to that admission.  2D echocardiogram on 03/12/2021 showed an ejection fraction of less than 20% with an LV diastolic diameter of 7.2 cm and a systolic diameter of 6.4 cm.  Stroke-volume index was 18.  Aortic insufficiency was moderate to severe with a pressure half-time of 188 ms.  Right ventricular function was felt to be normal at that time.  Cardiac catheterization on 03/13/2021 showed normal coronary arteries.  PA pressure is 36/17 with a mean of 25.  Wedge pressure was 20.  Cardiac index was 3.0.  Right atrial pressure was 4.  PA saturation was 74% and 78%.  She was continued on GDMT.  She had an ultrasound the abdomen that showed mildly nodular contour of the liver suggesting possible cirrhosis.  Cardiac MRI showed an aortic regurgitant fraction of 51% with an LVEF of 11%.  RVEF was 33%.  Right ventricle is mildly dilated.  The patient was referred to Dr. Laneta Simmers for Aortic Valve Replacement.  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  She remains active with no shortness of breath or orthopnea.  She has had no peripheral edema.  She denies any chest pain or pressure.  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  Left ventricular ejection fraction was estimated at 20% with a markedly dilated left ventricle and global hypokinesis.  It was felt the patient would require Aortic Valve Replacement.  The risks and benefits of the procedure were  explained to the patient and she was agreeable to proceed.  Hospital Course:   Ms. Sermons presented to [facility] on 11/02/2021.  She was taken to the operating room and underwent Closure of PFO and Aortic Valve Replacement with a 25 mm Edwards Resilia Inspiris Valve.  She required inotropic support to wean from cardiopulmonary bypass and was taken to the SICU in stable condition. The patient was extubated the evening of surgery without difficulty.  She was weaned off Epi as hemodynamics allowed. She was started on diuretics for volume overloaded state.  Her chest tubes, swan, and arterial line were removed without difficulty.   She remained on Milrinone with marginal co-ox results.  This was continued with improvement of hemodynamics and discontinuation of Milrinone on 11/07/2021.  She developed Atrial Fibrillation and Flutter.  She was rapid A paced and she was treated with Amiodarone bolus and drip.  She also was able to be rapidly paced back in NSR.  She had marginal blood pressure and required Neo gtt which was weaned off in less than in 24 hours. She was being diuresed with IV Lasix and Metolazone.  She was transitioned to oral Amiodarone.  Her volume status improved and her lasix was discontinued.  She remains in NSR on Amiodarone and Digoxin.  She is felt stable for transfer to the progressive care unit on 11/09/2021.  The patient went back into Atrial Flutter.  She has been started on Eliquis.

## 2021-11-02 NOTE — Interval H&P Note (Signed)
History and Physical Interval Note:  11/02/2021 6:55 AM  Pamela Bryan  has presented today for surgery, with the diagnosis of SEVERE AI.  The various methods of treatment have been discussed with the patient and family. After consideration of risks, benefits and other options for treatment, the patient has consented to  Procedure(s): AORTIC VALVE REPLACEMENT (AVR) (N/A) TRANSESOPHAGEAL ECHOCARDIOGRAM (TEE) (N/A) as a surgical intervention.  The patient's history has been reviewed, patient examined, no change in status, stable for surgery.  I have reviewed the patient's chart and labs.  Questions were answered to the patient's satisfaction.     Alleen Borne

## 2021-11-02 NOTE — Anesthesia Procedure Notes (Signed)
Procedure Name: Intubation Date/Time: 11/02/2021 7:59 AM Performed by: Erick Colace, CRNA Pre-anesthesia Checklist: Patient identified, Emergency Drugs available, Suction available and Patient being monitored Patient Re-evaluated:Patient Re-evaluated prior to induction Oxygen Delivery Method: Circle system utilized Preoxygenation: Pre-oxygenation with 100% oxygen Induction Type: IV induction Ventilation: Mask ventilation without difficulty Laryngoscope Size: Mac and 4 Grade View: Grade II Tube type: Oral Tube size: 8.0 mm Number of attempts: 1 Airway Equipment and Method: Stylet and Oral airway Placement Confirmation: ETT inserted through vocal cords under direct vision, positive ETCO2 and breath sounds checked- equal and bilateral Secured at: 22 cm Tube secured with: Tape Dental Injury: Teeth and Oropharynx as per pre-operative assessment

## 2021-11-02 NOTE — Anesthesia Procedure Notes (Signed)
Central Venous Catheter Insertion Performed by: Val Eagle, MD, anesthesiologist Start/End11/06/2021 8:18 AM, 11/02/2021 8:28 AM Patient location: OR. Preanesthetic checklist: patient identified, IV checked, site marked, risks and benefits discussed, surgical consent, monitors and equipment checked, pre-op evaluation, timeout performed and anesthesia consent Position: supine Lidocaine 1% used for infiltration and patient sedated Hand hygiene performed  and maximum sterile barriers used  Catheter size: 8.5 Fr Sheath introducer Procedure performed using ultrasound guided technique. Ultrasound Notes:anatomy identified, needle tip was noted to be adjacent to the nerve/plexus identified, no ultrasound evidence of intravascular and/or intraneural injection and image(s) printed for medical record Attempts: 1 Following insertion, line sutured, dressing applied and Biopatch. Post procedure assessment: blood return through all ports, free fluid flow and no air  Patient tolerated the procedure well with no immediate complications.

## 2021-11-02 NOTE — Anesthesia Procedure Notes (Addendum)
Central Venous Catheter Insertion Performed by: Val Eagle, MD, anesthesiologist Start/End11/06/2021 8:18 AM, 11/02/2021 8:28 AM Patient location: Pre-op. Preanesthetic checklist: patient identified, IV checked, site marked, risks and benefits discussed, surgical consent, monitors and equipment checked, pre-op evaluation, timeout performed and anesthesia consent Position: supine Patient sedated Maximum sterile barriers used  PA cath was placed.Procedure performed without using ultrasound guided technique. Attempts: 1 Following insertion, dressing applied. Patient tolerated the procedure well with no immediate complications.

## 2021-11-02 NOTE — H&P (Signed)
[address]       [place] [address]             [phone]      Cardiothoracic Surgery Admission History and Physical   PCP is Patient, No Pcp Per (Inactive) Referring Provider is Bensimhon, Shaune Pascal, MD       Chief Complaint  Patient presents with   Aortic Insuffiency           HPI:   The patient is a 50 year old woman with a history of alcohol and tobacco abuse who was admitted in March 2022 with acute systolic congestive heart failure with worsening bilateral lower extremity edema, 10 to 15 pound weight gain, and pulmonary edema with bilateral pleural effusions on chest x-ray.  She related that her symptoms began in January with orthopnea and PND and progressed to lower extremity edema few weeks prior to that admission.  2D echocardiogram on 03/12/2021 showed an ejection fraction of less than 20% with an LV diastolic diameter of 7.2 cm and a systolic diameter of 6.4 cm.  Stroke-volume index was 18.  Aortic insufficiency was moderate to severe with a pressure half-time of 188 ms.  Right ventricular function was felt to be normal at that time.  Cardiac catheterization on 03/13/2021 showed normal coronary arteries.  PA pressure is 36/17 with a mean of 25.  Wedge pressure was 20.  Cardiac index was 3.0.  Right atrial pressure was 4.  PA saturation was 74% and 78%.  She was continued on GDMT.  She had an ultrasound the abdomen that showed mildly nodular contour of the liver suggesting possible cirrhosis.  Cardiac MRI showed an aortic regurgitant fraction of 51% with an LVEF of 11%.  RVEF was 33%.  Right ventricle is mildly dilated.   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  She remains active with no shortness of breath or orthopnea.  She has had no peripheral edema.  She denies any chest pain or pressure.  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  Left ventricular  ejection fraction was estimated at 20% with a markedly dilated left ventricle and global hypokinesis.    [place] on 10/30/2021 showed:  Findings:   RA = 5 RV = 27/6 PA = 27/12 (20) PCW = 15 Fick cardiac output/index = 3.7/2.1 PVR = 1.4 WU Ao sat = 99% PA sat = 66%, 69%   Assessment: 1. Well compensate filling pressure with moderately reduced CO in setting of severe AI and significant LV dysfunction         Past Medical History:  Diagnosis Date   CHF (congestive heart failure) ([place])             Past Surgical History:  Procedure Laterality Date   CHOLECYSTECTOMY       RIGHT/LEFT HEART CATH AND CORONARY ANGIOGRAPHY N/A 03/13/2021    Procedure: RIGHT/LEFT HEART CATH AND CORONARY ANGIOGRAPHY;  Surgeon: Jolaine Artist, MD;  Location: [facility];  Service: Cardiovascular;  Laterality: N/A;   TEE WITHOUT CARDIOVERSION N/A 09/11/2021    Procedure: TRANSESOPHAGEAL ECHOCARDIOGRAM (TEE);  Surgeon: Jolaine Artist, MD;  Location: [facility] ENDOSCOPY;  Service: Cardiovascular;  Laterality: N/A;           Family History  Problem Relation Age of Onset   Valvular heart disease Sister  Social History Social History         Tobacco Use   Smoking status: Every Day   Smokeless tobacco: Never  Substance Use Topics   Alcohol use: Yes      Comment: occasional   Drug use: No            Current Outpatient Medications  Medication Sig Dispense Refill   buPROPion (WELLBUTRIN XL) 150 MG 24 hr tablet Take 150 mg by mouth 2 (two) times daily.       carvedilol (COREG) 3.125 MG tablet Take 1 tablet (3.125 mg total) by mouth 2 (two) times daily. 180 tablet 3   dapagliflozin propanediol (FARXIGA) 10 MG TABS tablet Take 1 tablet (10 mg total) by mouth daily. 30 tablet 6   digoxin (LANOXIN) 0.125 MG tablet Take 1 tablet (0.125 mg total) by mouth daily. 30 tablet 2   furosemide (LASIX) 40 MG tablet TAKE 1 TABLET (40 MG TOTAL) BY MOUTH DAILY AS NEEDED FOR FLUID OR EDEMA. 15 tablet 3    ivabradine (CORLANOR) 7.5 MG TABS tablet Take 1 tablet (7.5 mg total) by mouth 2 (two) times daily with a meal. 90 tablet 3   levonorgestrel (LILETTA, 52 MG,) 19.5 MCG/DAY IUD IUD 1 Intra Uterine Device by Intrauterine route once.       losartan (COZAAR) 25 MG tablet Take 1 tablet (25 mg total) by mouth at bedtime. 90 tablet 3   midodrine (PROAMATINE) 5 MG tablet Take 0.5 tablets (2.5 mg total) by mouth 2 (two) times daily with a meal. 45 tablet 3   Multiple Vitamin (MULTIVITAMIN) capsule Take 1 capsule by mouth daily.       potassium chloride SA (KLOR-CON) 20 MEQ tablet Take 1 tablet (20 mEq total) by mouth daily. 90 tablet 3   spironolactone (ALDACTONE) 25 MG tablet Take 0.5 tablets (12.5 mg total) by mouth daily. 30 tablet 3    No current facility-administered medications for this visit.      No Known Allergies   Review of Systems  Constitutional:  Negative for activity change, chills, fatigue, fever and unexpected weight change.  HENT: Negative.    Eyes: Negative.   Respiratory:  Negative for chest tightness and shortness of breath.   Cardiovascular:  Negative for chest pain, palpitations and leg swelling.  Gastrointestinal: Negative.   Endocrine: Negative.   Genitourinary: Negative.   Musculoskeletal: Negative.   Skin: Negative.   Allergic/Immunologic: Negative.   Neurological:  Negative for dizziness and syncope.  Hematological: Negative.   Psychiatric/Behavioral: Negative.      BP 113/71 (BP Location: Left Arm, Patient Position: Sitting)   Pulse 74   Resp 20   Ht 5\' 7"  (1.702 m)   Wt 153 lb (69.4 kg)   SpO2 97% Comment: RA  BMI 23.96 kg/m  Physical Exam Constitutional:      Appearance: Normal appearance. She is normal weight.  HENT:     Head: Normocephalic and atraumatic.  Eyes:     Extraocular Movements: Extraocular movements intact.     Conjunctiva/sclera: Conjunctivae normal.     Pupils: Pupils are equal, round, and reactive to light.  Neck:     Vascular:  No carotid bruit.  Cardiovascular:     Rate and Rhythm: Normal rate and regular rhythm.     Heart sounds: Murmur heard.     Comments: 2/6 systolic murmur along the right sternal border.  2/6 diastolic murmur along the left lower sternal border. Pulmonary:     Effort: Pulmonary effort  is normal.     Breath sounds: Normal breath sounds.  Musculoskeletal:        General: No swelling. Normal range of motion.     Cervical back: Normal range of motion and neck supple.  Skin:    General: Skin is warm and dry.  Neurological:     General: No focal deficit present.     Mental Status: She is alert and oriented to person, place, and time.  Psychiatric:        Mood and Affect: Mood normal.        Behavior: Behavior normal.        Diagnostic Tests:   ECHOCARDIOGRAM REPORT         Patient Name:   Pamela Bryan Date of Exam: 07/30/2021  Medical Rec #:  [id number]     Height:       67.0 in  Accession #:    [id number]    Weight:       148.0 lb  Date of Birth:  11/29/1971      BSA:          1.779 m  Patient Age:    23 years      BP:           109/70 mmHg  Patient Gender: F             HR:           97 bpm.  Exam Location:  Outpatient   Procedure: 2D Echo   Indications:    congestive heart failure     History:        Patient has prior history of Echocardiogram examinations,  most                  recent 03/12/2021. Pulmonary edema.     Sonographer:    Johny Chess RDCS  Referring Phys: [phone] AMY D CLEGG   IMPRESSIONS     1. Left ventricular ejection fraction, by estimation, is <20%. The left  ventricle has severely decreased function. The left ventricle demonstrates  global hypokinesis. The left ventricular internal cavity size was severely  dilated. Indeterminate  diastolic filling due to E-A fusion.   2. Right ventricular systolic function is normal. The right ventricular  size is normal.   3. Left atrial size was severely dilated.   4. Right atrial size was mildly dilated.    5. The mitral valve is normal in structure. Mild mitral valve  regurgitation. No evidence of mitral stenosis.   6. Aortic valve appears functionally bicuspid. AI jet appears to be  moderate but pressure-half time suggests it may be severe. AS is mild by  Doppler but may be underestimated in setting of severely depressed LV  function. Consider TEE to further evalaute   . The aortic valve is bicuspid. There is moderate calcification of the  aortic valve. Aortic valve regurgitation is moderate. Mild aortic valve  stenosis. Aortic regurgitation PHT measures 180 msec. Aortic valve area,  by VTI measures 1.63 cm. Aortic  valve mean gradient measures 14.0 mmHg. Aortic valve Vmax measures 2.35  m/s.   7. There is mild dilatation of the ascending aorta, measuring 39 mm.   8. The inferior vena cava is normal in size with greater than 50%  respiratory variability, suggesting right atrial pressure of 3 mmHg.   FINDINGS   Left Ventricle: Left ventricular ejection fraction, by estimation, is  <20%. The left ventricle has severely decreased function. The left  ventricle demonstrates global hypokinesis. The left ventricular internal  cavity size was severely dilated. There is  no left ventricular hypertrophy. Indeterminate diastolic filling due to  E-A fusion.   Right Ventricle: The right ventricular size is normal. No increase in  right ventricular wall thickness. Right ventricular systolic function is  normal.   Left Atrium: Left atrial size was severely dilated.   Right Atrium: Right atrial size was mildly dilated.   Pericardium: There is no evidence of pericardial effusion.   Mitral Valve: The mitral valve is normal in structure. Mild mitral valve  regurgitation. No evidence of mitral valve stenosis.   Tricuspid Valve: The tricuspid valve is normal in structure. Tricuspid  valve regurgitation is trivial. No evidence of tricuspid stenosis.   Aortic Valve: Aortic valve appears  functionally bicuspid. AI jet appears  to be moderate but pressure-half time suggests it may be severe. AS is  mild by Doppler but may be underestimated in setting of severely depressed  LV function. Consider TEE to  further evalaute. The aortic valve is bicuspid. There is moderate  calcification of the aortic valve. Aortic valve regurgitation is moderate.  Aortic regurgitation PHT measures 180 msec. Mild aortic stenosis is  present. Aortic valve mean gradient measures  14.0 mmHg. Aortic valve peak gradient measures 22.1 mmHg. Aortic valve  area, by VTI measures 1.63 cm.   Pulmonic Valve: The pulmonic valve was normal in structure. Pulmonic valve  regurgitation is trivial. No evidence of pulmonic stenosis.   Aorta: The aortic root is normal in size and structure. There is mild  dilatation of the ascending aorta, measuring 39 mm.   Venous: The inferior vena cava is normal in size with greater than 50%  respiratory variability, suggesting right atrial pressure of 3 mmHg.   IAS/Shunts: No atrial level shunt detected by color flow Doppler.      LEFT VENTRICLE  PLAX 2D  LVIDd:         7.30 cm      Diastology  LVIDs:         6.70 cm      LV e' medial: 12.50 cm/s  LV PW:         0.80 cm  LV IVS:        0.70 cm  LVOT diam:     2.40 cm  LV SV:         63  LV SV Index:   36  LVOT Area:     4.52 cm     LV Volumes (MOD)  LV vol d, MOD A2C: 374.0 ml  LV vol s, MOD A2C: 296.0 ml  LV SV MOD A2C:     78.0 ml   RIGHT VENTRICLE             IVC  RV S prime:     10.20 cm/s  IVC diam: 1.80 cm  TAPSE (M-mode): 2.5 cm   LEFT ATRIUM              Index       RIGHT ATRIUM           Index  LA diam:        4.70 cm  2.64 cm/m  RA Area:     12.00 cm  LA Vol (A2C):   113.0 ml 63.51 ml/m RA Volume:   30.90 ml  17.37 ml/m  LA Vol (A4C):   91.7 ml  51.54 ml/m  LA Biplane Vol: 102.0 ml 57.32 ml/m  AORTIC VALVE  AV Area (Vmax):    1.47 cm  AV Area (Vmean):   1.32 cm  AV Area (VTI):     1.63  cm  AV Vmax:           235.00 cm/s  AV Vmean:          176.000 cm/s  AV VTI:            0.388 m  AV Peak Grad:      22.1 mmHg  AV Mean Grad:      14.0 mmHg  LVOT Vmax:         76.15 cm/s  LVOT Vmean:        51.450 cm/s  LVOT VTI:          0.140 m  LVOT/AV VTI ratio: 0.36  AI PHT:            180 msec     AORTA  Ao Root diam: 3.10 cm  Ao Asc diam:  4.10 cm      SHUNTS  Systemic VTI:  0.14 m  Systemic Diam: 2.40 cm   Glori Bickers MD  Electronically signed by Glori Bickers MD  Signature Date/Time: 07/30/2021/2:14:51 PM       TEE:   FINDINGS:   LEFT VENTRICLE: EF = 20%. Global HK. Markedly dilated.   RIGHT VENTRICLE: Moderate HK   LEFT ATRIUM: Moderately dilated   LEFT ATRIAL APPENDAGE: No thrombus.    RIGHT ATRIUM: Normal   AORTIC VALVE:  Probable unicuspid valve with severe eccentric AI   MITRAL VALVE:    Normal. Trivial MR   TRICUSPID VALVE: Normal. Trivial TR   PULMONIC VALVE: Grossly normal.   INTERATRIAL SEPTUM: Small PFO   PERICARDIUM: No effusion   DESCENDING AORTA: Mild plaque with diastolic flow reversal      Benay Spice 10:28 AM            Electronically signed by Jolaine Artist, MD at 09/11/2021 10:30 AM    Final       Physicians   Panel Physicians Referring Physician Case Authorizing Physician  Bensimhon, Shaune Pascal, MD (Primary)        Procedures   RIGHT/LEFT HEART CATH AND CORONARY ANGIOGRAPHY    Conclusion   Findings:   Ao = 86/68 (77) LV = 92/25 RA =  4 RV = 36/10 PA = 36/17 (25) PCW = 20 Fick cardiac output/index = 5.4/3.0 PVR = 1.0 WU SVR = 1088 Ao sat = 98% PA sat = 74%, 78% High SVC sat = 83%   Assessment: 1. Normal coronary arteries 2. 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 Elevated filling pressures 4. Cardiac output higher than I expected. Suspect peripheral shunting in setting of probable cirrhosis   Plan/Discussion:   Continue diuresis. Titrate GDMT. Consider cMRI. Check RUQ u/s to evaluate for  cirrhosis.    Glori Bickers, MD  6:15 PM       Indications   Acute systolic heart failure ([place]) [I50.21 (ICD-10-CM)]    Procedural Details   Technical Details The risks and indication of the procedure were explained. Consent was signed and placed on the chart. An appropriate timeout was taken prior to the procedure.   The right AC fossa was prepped and draped in the routine sterile fashion and anesthetized with 1% local lidocaine. The pre-existing PIV in the right [facility] was exchanged over a wire for a 5 FR venous sheath using a modified Seldinger technique. A standard Swan-Ganz catheter was used  for the procedure.   After a normal Allen's test was confirmed, the right wrist was prepped and draped in the routine sterile fashion and anesthetized with 1% local lidocaine. A 5 FR arterial sheath was then placed in the right radial artery using a modified Seldinger technique. 3mg  IV verapamil was given through the sheath. Systemic heparin was administered.  Standard catheters including a JL 3.5 and a JR 4 were used. All catheter exchanges were made over a wire.  Estimated blood loss <50 mL.   During this procedure medications were administered to achieve and maintain moderate conscious sedation while the patient's heart rate, blood pressure, and oxygen saturation were continuously monitored and I was present face-to-face 100% of this time.    Medications (Filter: Administrations occurring from 1710 to 1815 on 03/13/21)  important  Continuous medications are totaled by the amount administered until 03/13/21 1815.    midazolam (VERSED) injection (mg) Total dose:  1 mg Date/Time Rate/Dose/Volume Action    03/13/21 1736 1 mg Given      fentaNYL (SUBLIMAZE) injection (mcg) Total dose:  25 mcg Date/Time Rate/Dose/Volume Action    03/13/21 1736 25 mcg Given      lidocaine (PF) (XYLOCAINE) 1 % injection (mL) Total volume:  5 mL Date/Time Rate/Dose/Volume Action    03/13/21 1743 5 mL Given       Heparin (Porcine) in NaCl 1000-0.9 UT/500ML-% SOLN (mL) Total volume:  1,000 mL Date/Time Rate/Dose/Volume Action    03/13/21 1743 500 mL Given    1743 500 mL Given      heparin sodium (porcine) injection (Units) Total dose:  3,500 Units Date/Time Rate/Dose/Volume Action    03/13/21 1750 3,500 Units Given      Radial Cocktail/Verapamil only (mL) Total volume:  10 mL Date/Time Rate/Dose/Volume Action    03/13/21 1746 10 mL Given      iohexol (OMNIPAQUE) 350 MG/ML injection (mL) Total volume:  20 mL Date/Time Rate/Dose/Volume Action    03/13/21 1801 20 mL Given      0.9 %  sodium chloride infusion (mL) Total dose:  Cannot be calculated* Dosing weight:  74.8 *Administration dose not documented Date/Time Rate/Dose/Volume Action    03/13/21 1729 0  [vol]        0.9 %  sodium chloride infusion (mL/hr) Total dose:  Cannot be calculated* Dosing weight:  74.8 *Administration dose not documented Date/Time Rate/Dose/Volume Action    03/13/21 1729 80  [vol]        digoxin (LANOXIN) tablet 0.125 mg (mg) Total dose:  Cannot be calculated* Dosing weight:  74.8 *Administration dose not documented Date/Time Rate/Dose/Volume Action    03/13/21 1710 *Not included in total MAR Hold      enoxaparin (LOVENOX) injection 40 mg (mg) Total dose:  Cannot be calculated* Dosing weight:  74.8 *Administration dose not documented Date/Time Rate/Dose/Volume Action    03/13/21 1710 *Not included in total MAR Hold      furosemide (LASIX) injection 40 mg (mg) Total dose:  Cannot be calculated* Dosing weight:  74.8 *Administration dose not documented Date/Time Rate/Dose/Volume Action    03/13/21 1710 *Not included in total MAR Hold    1800 *Not included in total Automatically Held      nicotine (NICODERM CQ - dosed in mg/24 hours) patch 21 mg (mg) Total dose:  Cannot be calculated* Dosing weight:  74.8 *Administration dose not documented Date/Time Rate/Dose/Volume Action    03/13/21 1710 *Not  included in total MAR Hold      potassium chloride (  KLOR-CON) packet 20 mEq (mEq) Total dose:  Cannot be calculated* Dosing weight:  74.8 *Administration dose not documented Date/Time Rate/Dose/Volume Action    03/13/21 1710 *Not included in total MAR Hold      sodium chloride flush (NS) 0.9 % injection 3 mL (mL) Total dose:  Cannot be calculated* Dosing weight:  74.8 *Administration dose not documented Date/Time Rate/Dose/Volume Action    03/13/21 1710 *Not included in total MAR Hold      spironolactone (ALDACTONE) tablet 12.5 mg (mg) Total dose:  Cannot be calculated* Dosing weight:  74.8 *Administration dose not documented Date/Time Rate/Dose/Volume Action    03/13/21 1710 *Not included in total MAR Hold      Sedation Time   Sedation Time Physician-1: 22 minutes 34 seconds Contrast   Medication Name Total Dose  iohexol (OMNIPAQUE) 350 MG/ML injection 20 mL    Radiation/Fluoro   Fluoro time: 3.7 (min) DAP: 8572 (mGycm2) Cumulative Air Kerma: 117 (mGy) Coronary Findings   Diagnostic Dominance: Right Left Main  Vessel is angiographically normal.  Left Anterior Descending  Vessel is angiographically normal.  Left Circumflex  Vessel is angiographically normal.  Right Coronary Artery  Vessel is angiographically normal.  Intervention   No interventions have been documented. Wall Motion        Resting          All segments of the heart are hypokinetic.  EF 15%             Coronary Diagrams   Diagnostic Dominance: Right Intervention   Implants      No implant documentation for this case.    Syngo Images    Show images for CARDIAC CATHETERIZATION Images on Long Term Storage    Show images for [place], [place] to Procedure Log   Procedure Log    Hemo Data   Flowsheet Row Most Recent Value  Fick Cardiac Output 5.37 L/min  Fick Cardiac Output Index 2.98 (L/min)/BSA  RA A Wave 8 mmHg  RA V Wave 6 mmHg  RA Mean 4 mmHg  RV Systolic Pressure  36 mmHg  RV Diastolic Pressure 4 mmHg  RV EDP 10 mmHg  PA Systolic Pressure 36 mmHg  PA Diastolic Pressure 17 mmHg  PA Mean 25 mmHg  PW A Wave 22 mmHg  PW V Wave 22 mmHg  PW Mean 20 mmHg  AO Systolic Pressure 86 mmHg  AO Diastolic Pressure 68 mmHg  AO Mean 77 mmHg  LV Systolic Pressure 92 mmHg  LV Diastolic Pressure 16 mmHg  LV EDP 25 mmHg  AOp Systolic Pressure 85 mmHg  AOp Diastolic Pressure 65 mmHg  AOp Mean Pressure 75 mmHg  LVp Systolic Pressure 96 mmHg  LVp Diastolic Pressure 15 mmHg  LVp EDP Pressure 25 mmHg  QP/QS 1  TPVR Index 8.38 HRUI  TSVR Index 25.81 HRUI  PVR SVR Ratio 0.07  TPVR/TSVR Ratio 0.32      Impression:   This 50 year old woman has a possible unicuspid aortic valve with severe aortic insufficiency of unknown duration presenting in March 2022 with acute systolic congestive heart failure.  Cardiac MRI at that time showed a regurgitant fraction of 51% with left ventricular ejection fraction of 11%.  There was moderate right ventricular systolic dysfunction with ejection fraction of 33%.  She has been stable on GDMT and is currently asymptomatic.  Her recent TEE showed an ejection fraction of 20% with a severely dilated left ventricle and severe aortic insufficiency.  Right ventricular systolic function is moderately  depressed.  She has no coronary disease and it is felt that the etiology of this may be alcohol abuse or longstanding severe aortic insufficiency.  I think the best option for treating her is going to be aortic valve replacement using a bioprosthetic valve.  Her surgical risk is increased due to biventricular systolic dysfunction.  If the aortic valve is not replaced she will likely have progressively worsening left ventricular function and end-stage congestive heart failure that will require advanced therapies.  This could certainly happen at any point. I would also plan to close a PFO if it is seen on TEE since there is a good chance that she will  require an LVAD at some point in the future. I reviewed the echocardiogram images with her and her mother and answered all their questions. I discussed the operative procedure with the patient and her mother including alternatives, benefits and risks; including but not limited to bleeding, blood transfusion, infection, stroke, myocardial infarction, graft failure, heart block requiring a permanent pacemaker, organ dysfunction, and death.  Rudolpho Sevin understands and agrees to proceed.  Plan:  Aortic valve replacement using a bioprosthetic valve and possible closure of PFO.   Gaye Pollack, MD [facility] [phone]

## 2021-11-02 NOTE — Op Note (Signed)
CARDIOVASCULAR SURGERY OPERATIVE NOTE  11/02/2021 Candise Che [id number]  Surgeon:  Alleen Borne, MD  First Assistant: Lowella Dandy,  PA-C:  An experienced assistant was required given the complexity of this surgery and the standard of surgical care. The assistant was needed for exposure, dissection, suctioning, retraction of delicate tissues and sutures, instrument exchange and for overall help during this procedure.    Preoperative Diagnosis:  Severe aortic stenosis and patent foramen ovale   Postoperative Diagnosis:  Same   Procedure:  Median Sternotomy Extracorporeal circulation 3.   Aortic valve replacement using a 25 mm Edwards INSPIRIS RESILIA pericardial valve. 4.   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  Cardiac MRI at that time showed a regurgitant fraction of 51% with left ventricular ejection fraction of 11%.  There was moderate right ventricular systolic dysfunction with ejection fraction of 33%.  She has been stable on GDMT and is currently asymptomatic.  Her recent TEE showed an ejection fraction of 20% with a severely dilated left ventricle and severe aortic insufficiency.  Right ventricular systolic function is moderately depressed.  She has no coronary disease and it is felt that the etiology of this may be alcohol abuse or longstanding severe aortic insufficiency.  I think the best option for treating her is going to be aortic valve replacement using a bioprosthetic valve.  Her surgical risk is increased due to biventricular systolic dysfunction.  If the aortic valve is not replaced she will likely have progressively worsening left ventricular function and end-stage congestive heart failure that will require advanced  therapies.  This could certainly happen at any point. I would also plan to close a PFO if it is seen on TEE since there is a good chance that she will require an LVAD at some point in the future. I reviewed the echocardiogram images with her and her mother and answered all their questions. I discussed the operative procedure with the patient and her mother including alternatives, benefits and risks; including but not limited to bleeding, blood transfusion, infection, stroke, myocardial infarction, graft failure, heart block requiring a permanent pacemaker, organ dysfunction, and death.  Candise Che understands and agrees to proceed.  Preparation:  The patient was seen in the preoperative holding area and the correct patient, correct operation were confirmed with the patient after reviewing the medical record and catheterization. The consent was signed by me. Preoperative antibiotics were given. A pulmonary arterial line and radial arterial line were placed by the anesthesia team. The patient was taken back to the operating room and positioned supine on the operating room table. After being placed under general endotracheal anesthesia by the anesthesia team a foley catheter was placed. The neck, chest, abdomen, and both legs were prepped with betadine soap and solution and draped in the usual sterile manner. A surgical time-out was taken and the correct patient and operative procedure were confirmed with the nursing and anesthesia staff.   Pre-bypass TEE:   Complete TEE assessment was performed by Dr. Corky Sox. This showed a functionally bicuspid or unicuspid aortic valve with severe AI. LVEF 10-15% with LV diastolic diameter of 7.7 cm. Trivial MR, RV systolic function looks good. Trivial TR. There was a small PFO noted with slight left to right shunt but negative bubble study.  Post-bypass TEE:   Normal functioning prosthetic aortic valve with no perivalvular leak or regurgitation through the  valve. Mean gradient was 3 mm Hg. Left ventricular function unchanged. Unchanged trivial mitral regurgitation.    Cardiopulmonary Bypass:  A median sternotomy was performed. The pericardium was opened in the midline. Right ventricular function appeared normal. The ascending aorta was of normal size and had no palpable plaque. There were no contraindications to aortic cannulation or cross-clamping. The patient was fully systemically heparinized and the ACT was maintained > 400 sec. The proximal aortic arch was cannulated with a 20 F aortic cannula for arterial inflow. Bi-caval venous cannulation was performed using a 83F metal tip right angle cannula in the SVC and a 36 F plastic malleable cannula in the IVC. An antegrade cardioplegia/vent cannula was inserted into the mid-ascending aorta. A left ventricular vent was placed via the right superior pulmonary vein. A retrograde cardioplegia cannnula was placed into the coronary sinus via the right atrium. Aortic occlusion was performed with a single cross-clamp. Systemic cooling to 32 degrees Centigrade and topical cooling of the heart with iced saline were used. Cold retrograde KBC cardioplegia was used to induce diastolic arrest and then warm reanimation KBC retrograde cardioplegia was given prior to removing the cross clamp.  A temperature probe was inserted into the interventricular septum and an insulating pad was placed in the pericardium.    Closure of PFO:  The SVC and IVC were occluded with tapes. The right atrium was opened obliquely over the fossa ovalis. There was a large PFO present. There must not have been much shunt across it due to higher pressure on the left side closing the flap. It was closed with two 3-0 prolene pledgetted horizontal mattress sutures.    Aortic Valve Replacement:   A transverse aortotomy was performed 1 cm above the take-off of the right coronary artery. The native valve was trileaflet with fusion of the left/right  and right/non cusps with two raphes. The leaflets were thickened and there was a large central opening. The ostia of the coronary arteries were in normal position and were not obstructed. The native valve leaflets were excised and the annulus was decalcified with rongeurs. Care was taken to remove all particulate debris. The left ventricle was directly inspected for debris and then irrigated with ice saline solution. The annulus was sized and a size 25 Edwards INSPIRIS RESILIA valve was chosen. The model number was [id number] and the serial number was [id number]. While the valve was being prepared 2-0 Ethibond pledgeted horizontal mattress sutures were placed around the annulus with the pledgets in a sub-annular position. The sutures were placed through the sewing ring and the valve lowered into place. The sutures were tied sequentially. The valve seated nicely and the coronary ostia were not obstructed. The prosthetic valve leaflets moved normally and there was no sub-valvular obstruction. The aortotomy was closed using 4-0 Prolene suture in 2 layers with felt strips to reinforce the closure.  Completion:  The patient was rewarmed to 37 degrees Centigrade. De-airing maneuvers were performed and the head placed in trendelenburg position. The crossclamp was removed with a time of 79 minutes. There was spontaneous return of sinus rhythm. The aortotomy was checked for hemostasis. Two temporary epicardial pacing wires were placed on the right atrium and two on the right ventricle. The left ventricular vent and retrograde cardioplegia cannulas were removed. The patient was weaned from CPB without difficulty on no inotropes. CPB time was 113 minutes. Cardiac output was  5 LPM. Heparin was fully reversed with protamine and the aortic and venous cannulas removed. Hemostasis was achieved. Mediastinal drainage tubes were placed. The sternum was closed with #6 stainless steel wires. The fascia was closed with continuous # 1 vicryl  suture. The subcutaneous tissue was closed with 2-0 vicryl continuous suture. The skin was closed with 3-0 vicryl subcuticular suture. All sponge, needle, and instrument counts were reported correct at the end of the case. Dry sterile dressings were placed over the incisions and around the chest tubes which were connected to pleurevac suction. The patient was then transported to the surgical intensive care unit in stable condition.

## 2021-11-02 NOTE — Anesthesia Procedure Notes (Signed)
Arterial Line Insertion Start/End11/06/2021 6:55 AM, 11/02/2021 7:02 AM Performed by: Katina Degree, CRNA, CRNA  Patient location: Pre-op. Preanesthetic checklist: patient identified, IV checked, site marked, risks and benefits discussed, surgical consent, monitors and equipment checked, pre-op evaluation, timeout performed and anesthesia consent Lidocaine 1% used for infiltration Left, radial was placed Catheter size: 20 G Hand hygiene performed  and maximum sterile barriers used  Allen's test indicative of satisfactory collateral circulation Attempts: 1 Procedure performed without using ultrasound guided technique. Following insertion, dressing applied and Biopatch. Post procedure assessment: normal  Patient tolerated the procedure well with no immediate complications.

## 2021-11-02 NOTE — Brief Op Note (Signed)
11/02/2021  7:41 AM  PATIENT:  Pamela Bryan  50 y.o. female  PRE-OPERATIVE DIAGNOSIS:  SEVERE AI  POST-OPERATIVE DIAGNOSIS:  SEVERE AI  PROCEDURE:  Procedure(s):  AORTIC VALVE REPLACEMENT  -25 mm [employer] Resilia Bioprosthetic Valve  REPAIR OF PATENT FORAMEN OVALE  TRANSESOPHAGEAL ECHOCARDIOGRAM (TEE) (N/A)  APPLICATION OF CELL SAVER  SURGEON:  Surgeon(s) and Role:    * Bartle, Payton Doughty, MD - Primary  PHYSICIAN ASSISTANT: Lowella Dandy PA-C   ASSISTANTS: Mardi Mainland RNFA   ANESTHESIA:   general  EBL:  1168 mL   BLOOD ADMINISTERED:   CC CELLSAVER  DRAINS:  Mediastinal chest drains    LOCAL MEDICATIONS USED:  NONE  SPECIMEN:  Source of Specimen:  Aortic Valve Leaflets  DISPOSITION OF SPECIMEN:  PATHOLOGY  COUNTS:  YES  TOURNIQUET:  * No tourniquets in log *  DICTATION: .Dragon Dictation  PLAN OF CARE: Admit to inpatient   PATIENT DISPOSITION:  ICU - intubated and hemodynamically stable.   Delay start of Pharmacological VTE agent (>24hrs) due to surgical blood loss or risk of bleeding: yes

## 2021-11-02 NOTE — Anesthesia Preprocedure Evaluation (Signed)
Anesthesia Evaluation  Patient identified by MRN, date of birth, ID band Patient awake    Reviewed: Allergy & Precautions, NPO status , Patient's Chart, lab work & pertinent test results, reviewed documented beta blocker date and time   History of Anesthesia Complications Negative for: history of anesthetic complications  Airway Mallampati: II  TM Distance: >3 FB Neck ROM: Full    Dental  (+) Dental Advisory Given   Pulmonary neg shortness of breath, neg sleep apnea, neg COPD, neg recent URI, former smoker,    breath sounds clear to auscultation       Cardiovascular hypertension, Pt. on medications and Pt. on home beta blockers + CAD and +CHF  + Valvular Problems/Murmurs AS and AI  Rhythm:Regular  1. Left ventricular ejection fraction, by estimation, is 20%. The left  ventricle has severely decreased function. The left ventricle demonstrates  global hypokinesis. The left ventricular internal cavity size was severely  dilated.  2. Right ventricular systolic function is normal. The right ventricular  size is normal.  3. Left atrial size was moderately dilated. No left atrial/left atrial  appendage thrombus was detected.  4. The mitral valve is normal in structure. Trivial mitral valve  regurgitation. No evidence of mitral stenosis.  5. Possible unicuspid aortic valve with severe eccentric AI and diastolic  flow reversal in descending aorta. The aortic valve is abnormal. Aortic  valve regurgitation is severe. No aortic stenosis is present.  6. There is mild (Grade II) plaque involving the descending aorta.  7. The inferior vena cava is normal in size with greater than 50%  respiratory variability, suggesting right atrial pressure of 3 mmHg.  8. There is a small atrial septal defect.    RA = 5 RV = 27/6 PA = 27/12 (20) PCW = 15 Fick cardiac output/index = 3.7/2.1 PVR = 1.4 WU Ao sat = 99% PA sat = 66%,  69%  Assessment: 1. Well compensate filling pressure with moderately reduced CO in setting of severe AI and significant LV dysfunction  Plan/Discussion:  AVR on Monday.     Neuro/Psych negative neurological ROS  negative psych ROS   GI/Hepatic Neg liver ROS, hiatal hernia,   Endo/Other  negative endocrine ROS  Renal/GU negative Renal ROS     Musculoskeletal negative musculoskeletal ROS (+)   Abdominal   Peds  Hematology negative hematology ROS (+)   Anesthesia Other Findings   Reproductive/Obstetrics                             Anesthesia Physical Anesthesia Plan  ASA: 4  Anesthesia Plan: General   Post-op Pain Management:    Induction: Intravenous  PONV Risk Score and Plan: 3 and Ondansetron and Treatment may vary due to age or medical condition  Airway Management Planned: Oral ETT  Additional Equipment: Arterial line, CVP, PA Cath, TEE and Ultrasound Guidance Line Placement  Intra-op Plan:   Post-operative Plan: Post-operative intubation/ventilation  Informed Consent: I have reviewed the patients History and Physical, chart, labs and discussed the procedure including the risks, benefits and alternatives for the proposed anesthesia with the patient or authorized representative who has indicated his/her understanding and acceptance.     Dental advisory given  Plan Discussed with: CRNA and Anesthesiologist  Anesthesia Plan Comments:         Anesthesia Quick Evaluation

## 2021-11-02 NOTE — Progress Notes (Signed)
Patient ID: [name], female   DOB: 1971-02-04, 50 y.o.   MRN: [id number]  TCTS Evening Rounds:   Hemodynamically stable  CI = 3.4  Extubated and alert  Urine output good  CT output low  CBC    Component Value Date/Time   WBC 11.1 (H) 11/02/2021 1307   RBC 3.44 (L) 11/02/2021 1307   HGB 11.2 (L) 11/02/2021 1701   HCT 33.0 (L) 11/02/2021 1701   PLT 146 (L) 11/02/2021 1307   MCV 100.3 (H) 11/02/2021 1307   MCH 32.8 11/02/2021 1307   MCHC 32.8 11/02/2021 1307   RDW 12.6 11/02/2021 1307     BMET    Component Value Date/Time   NA 140 11/02/2021 1701   K 4.3 11/02/2021 1701   CL 105 11/02/2021 1139   CO2 21 (L) 10/30/2021 1030   GLUCOSE 141 (H) 11/02/2021 1139   BUN 13 11/02/2021 1139   CREATININE 0.70 11/02/2021 1139   CALCIUM 8.7 (L) 10/30/2021 1030   GFRNONAA >60 10/30/2021 1030     A/P:  Stable postop course. Continue current plans

## 2021-11-02 NOTE — Procedures (Signed)
Extubation Procedure Note  Patient Details:   Name: Pamela Bryan DOB: [birth year] MRN: [id number]   Airway Documentation:    Vent end date: 11/02/21 Vent end time: 1540   Evaluation  O2 sats: stable throughout Complications: No apparent complications Patient did tolerate procedure well. Bilateral Breath Sounds: Clear, Diminished   Yes  NIF: -25 VC: >10cc per kg.  Dewain Penning T 11/02/2021, 3:43 PM

## 2021-11-03 ENCOUNTER — Encounter (HOSPITAL_COMMUNITY): Payer: Self-pay | Admitting: Surgery

## 2021-11-03 ENCOUNTER — Inpatient Hospital Stay (HOSPITAL_COMMUNITY): Payer: No Typology Code available for payment source

## 2021-11-03 LAB — BASIC METABOLIC PANEL
Anion gap: 5 (ref 5–15)
Anion gap: 7 (ref 5–15)
BUN: 10 mg/dL (ref 6–20)
BUN: 9 mg/dL (ref 6–20)
CO2: 23 mmol/L (ref 22–32)
CO2: 27 mmol/L (ref 22–32)
Calcium: 8.1 mg/dL — ABNORMAL LOW (ref 8.9–10.3)
Calcium: 8.5 mg/dL — ABNORMAL LOW (ref 8.9–10.3)
Chloride: 106 mmol/L (ref 98–111)
Chloride: 99 mmol/L (ref 98–111)
Creatinine, Ser: 0.68 mg/dL (ref 0.44–1.00)
Creatinine, Ser: 0.76 mg/dL (ref 0.44–1.00)
GFR, Estimated: >60 mL/min (ref >60)
GFR, Estimated: >60 mL/min (ref >60)
Glucose, Bld: 106 mg/dL — ABNORMAL HIGH (ref 70–99)
Glucose, Bld: 156 mg/dL — ABNORMAL HIGH (ref 70–99)
Potassium: 3.9 mmol/L (ref 3.5–5.1)
Potassium: 4.1 mmol/L (ref 3.5–5.1)
Sodium: 134 mmol/L — ABNORMAL LOW (ref 135–145)
Sodium: 133 mmol/L — ABNORMAL LOW (ref 135–145)

## 2021-11-03 LAB — COOXEMETRY PANEL
Carboxyhemoglobin: 1.4 % (ref 0.5–1.5)
Methemoglobin: 0.9 % (ref 0.0–1.5)
O2 Saturation: 62.9 %
Total hemoglobin: 10.8 g/dL — ABNORMAL LOW (ref 12.0–16.0)

## 2021-11-03 LAB — PREPARE FRESH FROZEN PLASMA: Unit division: 0

## 2021-11-03 LAB — GLUCOSE, CAPILLARY
Glucose-Capillary: 119 mg/dL — ABNORMAL HIGH (ref 70–99)
Glucose-Capillary: 119 mg/dL — ABNORMAL HIGH (ref 70–99)
Glucose-Capillary: 122 mg/dL — ABNORMAL HIGH (ref 70–99)
Glucose-Capillary: 114 mg/dL — ABNORMAL HIGH (ref 70–99)
Glucose-Capillary: 129 mg/dL — ABNORMAL HIGH (ref 70–99)
Glucose-Capillary: 132 mg/dL — ABNORMAL HIGH (ref 70–99)
Glucose-Capillary: 142 mg/dL — ABNORMAL HIGH (ref 70–99)
Glucose-Capillary: 128 mg/dL — ABNORMAL HIGH (ref 70–99)
Glucose-Capillary: 119 mg/dL — ABNORMAL HIGH (ref 70–99)
Glucose-Capillary: 96 mg/dL (ref 70–99)
Glucose-Capillary: 99 mg/dL (ref 70–99)

## 2021-11-03 LAB — BPAM FFP
Blood Product Expiration Date: 202211092359
Blood Product Expiration Date: 202211112359
ISSUE DATE / TIME: 202211071213
ISSUE DATE / TIME: 202211071213
Unit Type and Rh: 6200
Unit Type and Rh: 6200

## 2021-11-03 LAB — MAGNESIUM
Magnesium: 2.1 mg/dL (ref 1.7–2.4)
Magnesium: 1.8 mg/dL (ref 1.7–2.4)

## 2021-11-03 LAB — CBC
HCT: 32.7 % — ABNORMAL LOW (ref 36.0–46.0)
HCT: 33.6 % — ABNORMAL LOW (ref 36.0–46.0)
Hemoglobin: 10.8 g/dL — ABNORMAL LOW (ref 12.0–15.0)
Hemoglobin: 11 g/dL — ABNORMAL LOW (ref 12.0–15.0)
MCH: 33 pg (ref 26.0–34.0)
MCH: 33.2 pg (ref 26.0–34.0)
MCHC: 33 g/dL (ref 30.0–36.0)
MCHC: 32.7 g/dL (ref 30.0–36.0)
MCV: 100 fL (ref 80.0–100.0)
MCV: 101.5 fL — ABNORMAL HIGH (ref 80.0–100.0)
Platelets: 139 10*3/uL — ABNORMAL LOW (ref 150–400)
Platelets: 128 10*3/uL — ABNORMAL LOW (ref 150–400)
RBC: 3.27 MIL/uL — ABNORMAL LOW (ref 3.87–5.11)
RBC: 3.31 MIL/uL — ABNORMAL LOW (ref 3.87–5.11)
RDW: 12.6 % (ref 11.5–15.5)
RDW: 12.8 % (ref 11.5–15.5)
WBC: 10.6 10*3/uL — ABNORMAL HIGH (ref 4.0–10.5)
WBC: 9.7 10*3/uL (ref 4.0–10.5)
nRBC: 0 % (ref 0.0–0.2)
nRBC: 0 % (ref 0.0–0.2)

## 2021-11-03 LAB — SURGICAL PATHOLOGY

## 2021-11-03 MED ORDER — FUROSEMIDE 10 MG/ML IJ SOLN
20.0000 mg | Freq: Once | INTRAMUSCULAR | Status: AC
  Administered 2021-11-03: 20 mg via INTRAVENOUS
  Filled 2021-11-03: qty 2

## 2021-11-03 MED ORDER — FUROSEMIDE 10 MG/ML IJ SOLN
40.0000 mg | Freq: Two times a day (BID) | INTRAMUSCULAR | Status: DC
  Administered 2021-11-03: 40 mg via INTRAVENOUS
  Filled 2021-11-03 (×2): qty 4

## 2021-11-03 MED ORDER — INSULIN ASPART 100 UNIT/ML IJ SOLN
0.0000 [IU] | INTRAMUSCULAR | Status: DC
  Administered 2021-11-03: 2 [IU] via SUBCUTANEOUS

## 2021-11-03 MED ORDER — POTASSIUM CHLORIDE CRYS ER 20 MEQ PO TBCR
20.0000 meq | EXTENDED_RELEASE_TABLET | Freq: Three times a day (TID) | ORAL | Status: AC
  Administered 2021-11-03 (×3): 20 meq via ORAL
  Filled 2021-11-03 (×3): qty 1

## 2021-11-03 MED ORDER — MILRINONE LACTATE IN DEXTROSE 20-5 MG/100ML-% IV SOLN
0.1250 ug/kg/min | INTRAVENOUS | Status: DC
  Administered 2021-11-03 – 2021-11-07 (×3): 0.125 ug/kg/min via INTRAVENOUS
  Filled 2021-11-03 (×3): qty 100

## 2021-11-03 MED ORDER — DIGOXIN 125 MCG PO TABS
0.1250 mg | ORAL_TABLET | Freq: Every day | ORAL | Status: DC
  Administered 2021-11-03 – 2021-11-11 (×9): 0.125 mg via ORAL
  Filled 2021-11-03 (×9): qty 1

## 2021-11-03 MED ORDER — ENOXAPARIN SODIUM 40 MG/0.4ML IJ SOSY
40.0000 mg | PREFILLED_SYRINGE | Freq: Every day | INTRAMUSCULAR | Status: DC
  Administered 2021-11-03 – 2021-11-08 (×6): 40 mg via SUBCUTANEOUS
  Filled 2021-11-03 (×6): qty 0.4

## 2021-11-03 MED FILL — Thrombin (Recombinant) For Soln 20000 Unit: CUTANEOUS | Qty: 1 | Status: AC

## 2021-11-03 NOTE — Discharge Summary (Signed)
[facility]       [facility],[place] [address]             [phone]    Physician Discharge Summary  Patient ID: Pamela Bryan MRN: [id number] DOB/AGE: 1971/08/12 50 y.o.  Admit date: 11/02/2021 Discharge date: 11/11/2021  Admission Diagnoses:  Patient Active Problem List   Diagnosis Date Noted   Nocturnal hypoxemia 07/08/2021   Cirrhosis of liver (HCC)    Acute systolic heart failure (HCC)    CHF (congestive heart failure) ([place]) 03/12/2021   Pulmonary edema 03/11/2021   Discharge Diagnoses:  Patient Active Problem List   Diagnosis Date Noted   S/P AVR and CLOSURE OF PFO 11/02/2021   Nocturnal hypoxemia 07/08/2021   Cirrhosis of liver (HCC)    Acute systolic heart failure (HCC)    CHF (congestive heart failure) ([place]) 03/12/2021   Pulmonary edema 03/11/2021   Discharged Condition: good  History of Present Illness:  The patient is a 50 year old woman with a history of alcohol and tobacco abuse who was admitted in March 2022 with acute systolic congestive heart failure with worsening bilateral lower extremity edema, 10 to 15 pound weight gain, and pulmonary edema with bilateral pleural effusions on chest x-ray.  She related that her symptoms began in January with orthopnea and PND and progressed to lower extremity edema few weeks prior to that admission.  2D echocardiogram on 03/12/2021 showed an ejection fraction of less than 20% with an LV diastolic diameter of 7.2 cm and a systolic diameter of 6.4 cm.  Stroke-volume index was 18.  Aortic insufficiency was moderate to severe with a pressure half-time of 188 ms.  Right ventricular function was felt to be normal at that time.  Cardiac catheterization on 03/13/2021 showed normal coronary arteries.  PA pressure is 36/17 with a mean of 25.  Wedge pressure was 20.  Cardiac index was 3.0.  Right atrial pressure was 4.  PA saturation was 74% and 78%.  She was continued on GDMT.  She had an ultrasound the abdomen that showed  mildly nodular contour of the liver suggesting possible cirrhosis.  Cardiac MRI showed an aortic regurgitant fraction of 51% with an LVEF of 11%.  RVEF was 33%.  Right ventricle is mildly dilated.  The patient was referred to [facility] for Aortic Valve Replacement.  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  She remains active with no shortness of breath or orthopnea.  She has had no peripheral edema.  She denies any chest pain or pressure.  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  Left ventricular ejection fraction was estimated at 20% with a markedly dilated left ventricle and global hypokinesis.  It was felt the patient would require Aortic Valve Replacement.  The risks and benefits of the procedure were explained to the patient and she was agreeable to proceed.  [facility] Course:   [name] presented to [facility] on 11/02/2021.  She was taken to the operating room and underwent Closure of PFO and Aortic Valve Replacement with a 25 mm Edwards Resilia Inspiris Valve.  She required inotropic support to wean from cardiopulmonary bypass and was taken to the [facility] in stable condition. The patient was extubated the evening of surgery without difficulty.  She was weaned off Epi as hemodynamics allowed. She was started on diuretics for  volume overloaded state.  Her chest tubes, swan, and arterial line were removed without difficulty.   She remained on Milrinone with marginal co-ox results.  This was continued with improvement of hemodynamics and discontinuation of Milrinone on 11/07/2021.  She developed Atrial Fibrillation and Flutter.  She was rapid A paced and she was treated with Amiodarone bolus and drip.  She also was able to be rapidly paced back in NSR.  She had marginal blood pressure and required Neo gtt which was weaned off in less than in 24 hours. She was being diuresed with  IV Lasix and Metolazone.  She was transitioned to oral Amiodarone.  Her volume status improved and her lasix was discontinued.  She remains in NSR on Amiodarone and Digoxin.  She is felt stable for transfer to the progressive care unit on 11/09/2021.  The patient went back into Atrial Flutter.  She has been started on Eliquis.  She has an expected acute blood loss anemia with stable values.  Most recent hemoglobin hematocrit dated 11/11/2021 are 10.7/31.1 respectively.  Renal function is within normal limits.  She is maintaining normal sinus rhythm following rapid atrial pacing.  Her amiodarone has been switched to p.o. which will be tapered as an outpatient.  Incisions are noted to be healing well without evidence of infection.  She is tolerating routine activities using standard post cardiac surgical protocols.  She has been weaned off oxygen.  Overall she is felt to be stable for discharge  Consults:  Advance heart failure  Significant Diagnostic Studies: cardiac graphics:   Echocardiogram:    IMPRESSIONS     1. Left ventricular ejection fraction, by estimation, is <20%. The left  ventricle has severely decreased function. The left ventricle demonstrates  global hypokinesis. The left ventricular internal cavity size was severely  dilated. Indeterminate  diastolic filling due to E-A fusion.   2. Right ventricular systolic function is normal. The right ventricular  size is normal.   3. Left atrial size was severely dilated.   4. Right atrial size was mildly dilated.   5. The mitral valve is normal in structure. Mild mitral valve  regurgitation. No evidence of mitral stenosis.   6. Aortic valve appears functionally bicuspid. AI jet appears to be  moderate but pressure-half time suggests it may be severe. AS is mild by  Doppler but may be underestimated in setting of severely depressed LV  function. Consider TEE to further evalaute   . The aortic valve is bicuspid. There is moderate  calcification of the  aortic valve. Aortic valve regurgitation is moderate. Mild aortic valve  stenosis. Aortic regurgitation PHT measures 180 msec. Aortic valve area,  by VTI measures 1.63 cm. Aortic  valve mean gradient measures 14.0 mmHg. Aortic valve Vmax measures 2.35  m/s.   7. There is mild dilatation of the ascending aorta, measuring 39 mm.   8. The inferior vena cava is normal in size with greater than 50%  respiratory variability, suggesting right atrial pressure of 3 mmHg.   Treatments: surgery:   Surgeon:  Gaye Pollack, MD   First Assistant: Ellwood Handler,  PA-C:  An experienced assistant was required given the complexity of this surgery and the standard of surgical care. The assistant was needed for exposure, dissection, suctioning, retraction of delicate tissues and sutures, instrument exchange and for overall help during this procedure.     Preoperative Diagnosis:  Severe aortic stenosis and patent foramen ovale     Postoperative Diagnosis:  Same  Procedure:   Median Sternotomy Extracorporeal circulation 3.   Aortic valve replacement using a 25 mm Edwards INSPIRIS RESILIA pericardial valve. 4.   Closure of patent foramen ovale  Discharge Exam: Blood pressure 94/68, pulse 81, temperature 98.2 F (36.8 C), temperature source Oral, resp. rate 17, height 5\' 7"  (1.702 m), weight 66.2 kg, SpO2 93 %.   General appearance: alert and cooperative Heart: regular rate and rhythm, S1, S2 normal, no murmur Lungs: clear to auscultation bilaterally Wound: incision healing well   Discharge Medications:  The patient has been discharged on:   1.Beta Blocker:  Yes [   ]                              No   [ n  ]                              If No, reason:meds per AHF team, labile BP  2.Ace Inhibitor/ARB: Yes [   ]                                     No  [ n   ]                                     If No, reason:labile BP  3.Statin:   Yes [   ]                  No  [   n ]                  If No, reason:no CAD  4. Marlowe Kays  [ y  ]                  No   [   ]                  If No, reason:  Patient had ACS upon admission:  Plavix/P2Y12 inhibitor: Yes [   ]                                      No  [ n  ]     Discharge Instructions     Amb Referral to Cardiac Rehabilitation   Complete by: As directed    Diagnosis: Valve Replacement   Valve: Aortic   After initial evaluation and assessments completed: Virtual Based Care may be provided alone or in conjunction with Phase 2 Cardiac Rehab based on patient barriers.: Yes   Discharge patient   Complete by: As directed    Discharge disposition: 01-Home or Self Care   Discharge patient date: 11/11/2021      Allergies as of 11/11/2021   No Known Allergies      Medication List     STOP taking these medications    carvedilol 3.125 MG tablet Commonly known as: COREG   dapagliflozin propanediol 10 MG Tabs tablet Commonly known as: Farxiga   furosemide 40 MG tablet Commonly known as: LASIX   ivabradine 7.5 MG Tabs tablet Commonly known as: Corlanor   losartan 25 MG tablet Commonly known  as: COZAAR   spironolactone 25 MG tablet Commonly known as: ALDACTONE       TAKE these medications    amiodarone 400 MG tablet Commonly known as: PACERONE Take 1 tablet (400 mg total) by mouth 2 (two) times daily. For 7 days then 400 mg p.o. once daily   apixaban 5 MG Tabs tablet Commonly known as: ELIQUIS Take 1 tablet (5 mg total) by mouth 2 (two) times daily.   aspirin 81 MG EC tablet Take 1 tablet (81 mg total) by mouth daily. Swallow whole. Start taking on: November 12, 2021   buPROPion 150 MG 12 hr tablet Commonly known as: WELLBUTRIN SR Take 150 mg by mouth 2 (two) times daily.   digoxin 0.125 MG tablet Commonly known as: LANOXIN Take 1 tablet (0.125 mg total) by mouth daily.   diphenhydrAMINE 25 MG tablet Commonly known as: BENADRYL Take 25 mg by mouth at bedtime as  needed for allergies.   Liletta (52 MG) 20.1 MCG/DAY Iud Generic drug: levonorgestrel 1 Intra Uterine Device by Intrauterine route once.   midodrine 10 MG tablet Commonly known as: PROAMATINE Take 1 tablet (10 mg total) by mouth 3 (three) times daily with meals. What changed:  medication strength how much to take when to take this   multivitamin capsule Take 1 capsule by mouth daily.   oxyCODONE 5 MG immediate release tablet Commonly known as: Oxy IR/ROXICODONE Take 1 tablet (5 mg total) by mouth every 3 (three) hours as needed for severe pain.        Follow-up Information     [facility] [place] Follow up on 11/11/2021.   Specialty: Cardiothoracic Surgery Why: Appointment is at 11:00 with nurse for suture removal Contact information: [place], [place]        Gaye Pollack, MD Follow up on 12/02/2021.   Specialty: Cardiothoracic Surgery Why: Appointment is at 4:30, please get CXR at 4:00 at [facility] located on first floor of our office building Contact information: [address] [place] [address] [phone]                 Signed: John Giovanni, PA-C  11/11/2021, 11:17 AM

## 2021-11-03 NOTE — Progress Notes (Signed)
      [address]       [place] [address]             [phone]      POD # 1 AVR, closure of PFO  Resting comfortably  BP 94/83   Pulse 90   Temp 99 F (37.2 C) (Oral)   Resp (!) 21   Ht 5\' 7"  (1.702 m)   Wt 73.1 kg   SpO2 95%   BMI 25.24 kg/m  On milrinone 0.125 mcg/kg/min   Intake/Output Summary (Last 24 hours) at 11/03/2021 1827 Last data filed at 11/03/2021 1700 Gross per 24 hour  Intake 1743.7 ml  Output 2435 ml  Net -691.3 ml   Hct 33, creatinine 0.76, K- 4.1  Doing well POD # 1  Any Mcneice C. 13/07/2021, MD [facility] [phone]

## 2021-11-03 NOTE — Progress Notes (Signed)
1 Day Post-Op Procedure(s) (LRB): AORTIC VALVE REPLACEMENT (AVR) USING INSPIRIS VALVE SIZE (N/A) TRANSESOPHAGEAL ECHOCARDIOGRAM (TEE) (N/A) APPLICATION OF CELL SAVER REPAIR OF PATENT FORAMEN OVALE Subjective: No complaints.  Objective: Vital signs in last 24 hours: Temp:  [96.6 F (35.9 C)-98.8 F (37.1 C)] 97.7 F (36.5 C) (11/08 0700) Pulse Rate:  [69-89] 80 (11/08 0700) Cardiac Rhythm: Normal sinus rhythm (11/08 0600) Resp:  [12-29] 20 (11/08 0700) BP: (96-127)/(71-80) 106/79 (11/07 2000) SpO2:  [90 %-100 %] 97 % (11/08 0700) Arterial Line BP: (87-138)/(60-85) 123/81 (11/08 0700) FiO2 (%):  [40 %-50 %] 40 % (11/07 1520) Weight:  [73.1 kg] 73.1 kg (11/08 0500)  Hemodynamic parameters for last 24 hours: PAP: (19-46)/(8-28) 46/28 CO:  [3.5 L/min-6.1 L/min] 4.4 L/min CI:  [2 L/min/m2-3.4 L/min/m2] 2.5 L/min/m2  Intake/Output from previous day: 11/07 0701 - 11/08 0700 In: 4866.5 [P.O.:480; I.V.:2228.3; Blood:1117; IV Piggyback:1041.1] Out: 3078 [Urine:1620; Blood:1168; Chest Tube:290] Intake/Output this shift: No intake/output data recorded.  General appearance: alert and cooperative Neurologic: intact Heart: regular rate and rhythm, S1, S2 normal, no murmur Lungs: clear to auscultation bilaterally Extremities: edema mild Wound: dressing dry  Lab Results: Recent Labs    11/02/21 1900 11/03/21 0446  WBC 10.5 10.6*  HGB 11.1* 10.8*  HCT 33.9* 32.7*  PLT 153 139*   BMET:  Recent Labs    11/02/21 1900 11/03/21 0446  NA 138 134*  K 4.0 3.9  CL 106 106  CO2 23 23  GLUCOSE 161* 106*  BUN 17 10  CREATININE 0.80 0.68  CALCIUM 8.0* 8.1*    PT/INR:  Recent Labs    11/02/21 1307  LABPROT 16.9*  INR 1.4*   ABG    Component Value Date/Time   PHART 7.374 11/02/2021 1701   HCO3 25.5 11/02/2021 1701   TCO2 27 11/02/2021 1701   ACIDBASEDEF 2.0 11/02/2021 1530   O2SAT 62.9 11/03/2021 0446   CBG (last 3)  Recent Labs    11/03/21 0443 11/03/21 0547  11/03/21 0652  GLUCAP 114* 129* 132*   CXR: LLLatelectasis  Assessment/Plan: S/P Procedure(s) (LRB): AORTIC VALVE REPLACEMENT (AVR) USING INSPIRIS VALVE SIZE (N/A) TRANSESOPHAGEAL ECHOCARDIOGRAM (TEE) (N/A) APPLICATION OF CELL SAVER REPAIR OF PATENT FORAMEN OVALE  POD 1 Hemodynamically stable in sinus 70. Co-ox 63. Wean off epi. Continue milrinone for now. Will discuss resuming her heart failure regimen with DB. Preop EF 10-15%  Volume excess: start diuresis.  Glucose under good control. DC insulin drip and transition to SSI.  DC MT's, swan, arterial line.  Work on [employer], OOB, ambulate.   LOS: 1 day    Alleen Borne 11/03/2021

## 2021-11-03 NOTE — Discharge Instructions (Signed)

## 2021-11-04 ENCOUNTER — Inpatient Hospital Stay (HOSPITAL_COMMUNITY): Payer: No Typology Code available for payment source

## 2021-11-04 DIAGNOSIS — I351 Nonrheumatic aortic (valve) insufficiency: Secondary | ICD-10-CM | POA: Diagnosis not present

## 2021-11-04 DIAGNOSIS — Z952 Presence of prosthetic heart valve: Secondary | ICD-10-CM | POA: Diagnosis not present

## 2021-11-04 DIAGNOSIS — I5023 Acute on chronic systolic (congestive) heart failure: Secondary | ICD-10-CM | POA: Diagnosis not present

## 2021-11-04 LAB — BASIC METABOLIC PANEL
Anion gap: 6 (ref 5–15)
BUN: 8 mg/dL (ref 6–20)
CO2: 28 mmol/L (ref 22–32)
Calcium: 8.4 mg/dL — ABNORMAL LOW (ref 8.9–10.3)
Chloride: 99 mmol/L (ref 98–111)
Creatinine, Ser: 0.75 mg/dL (ref 0.44–1.00)
GFR, Estimated: >60 mL/min (ref >60)
Glucose, Bld: 91 mg/dL (ref 70–99)
Potassium: 4.5 mmol/L (ref 3.5–5.1)
Sodium: 133 mmol/L — ABNORMAL LOW (ref 135–145)

## 2021-11-04 LAB — CBC
HCT: 33.1 % — ABNORMAL LOW (ref 36.0–46.0)
Hemoglobin: 10.8 g/dL — ABNORMAL LOW (ref 12.0–15.0)
MCH: 33.1 pg (ref 26.0–34.0)
MCHC: 32.6 g/dL (ref 30.0–36.0)
MCV: 101.5 fL — ABNORMAL HIGH (ref 80.0–100.0)
Platelets: 126 10*3/uL — ABNORMAL LOW (ref 150–400)
RBC: 3.26 MIL/uL — ABNORMAL LOW (ref 3.87–5.11)
RDW: 12.8 % (ref 11.5–15.5)
WBC: 8.3 10*3/uL (ref 4.0–10.5)
nRBC: 0 % (ref 0.0–0.2)

## 2021-11-04 LAB — COOXEMETRY PANEL
Carboxyhemoglobin: 1.5 % (ref 0.5–1.5)
Methemoglobin: 1 % (ref 0.0–1.5)
O2 Saturation: 56 %
Total hemoglobin: 11 g/dL — ABNORMAL LOW (ref 12.0–16.0)

## 2021-11-04 LAB — GLUCOSE, CAPILLARY: Glucose-Capillary: 76 mg/dL (ref 70–99)

## 2021-11-04 MED ORDER — AMIODARONE LOAD VIA INFUSION: 150.0000 mg | Freq: Once | INTRAVENOUS | Status: AC

## 2021-11-04 MED ORDER — MIDODRINE HCL 5 MG PO TABS
10.0000 mg | ORAL_TABLET | Freq: Three times a day (TID) | ORAL | Status: DC
  Administered 2021-11-04 – 2021-11-11 (×21): 10 mg via ORAL
  Filled 2021-11-04 (×21): qty 2

## 2021-11-04 MED ORDER — PHENYLEPHRINE HCL-NACL 20-0.9 MG/250ML-% IV SOLN
0.0000 ug/min | INTRAVENOUS | Status: DC
  Filled 2021-11-04: qty 250

## 2021-11-04 MED ORDER — FUROSEMIDE 10 MG/ML IJ SOLN
40.0000 mg | Freq: Two times a day (BID) | INTRAMUSCULAR | Status: DC
  Administered 2021-11-04: 40 mg via INTRAVENOUS
  Filled 2021-11-04: qty 4

## 2021-11-04 MED ORDER — AMIODARONE HCL IN DEXTROSE 360-4.14 MG/200ML-% IV SOLN
INTRAVENOUS | Status: AC
  Administered 2021-11-04: 150 mg via INTRAVENOUS
  Filled 2021-11-04: qty 200

## 2021-11-04 MED ORDER — AMIODARONE LOAD VIA INFUSION
150.0000 mg | Freq: Once | INTRAVENOUS | Status: AC
  Administered 2021-11-04: 150 mg via INTRAVENOUS

## 2021-11-04 MED ORDER — ALBUMIN HUMAN 5 % IV SOLN
INTRAVENOUS | Status: AC
  Administered 2021-11-04: 12.5 g via INTRAVENOUS
  Filled 2021-11-04: qty 250

## 2021-11-04 MED ORDER — PHENYLEPHRINE 40 MCG/ML (10ML) SYRINGE FOR IV PUSH (FOR BLOOD PRESSURE SUPPORT)
PREFILLED_SYRINGE | INTRAVENOUS | Status: AC
  Filled 2021-11-04: qty 10

## 2021-11-04 MED ORDER — ALBUMIN HUMAN 5 % IV SOLN: 12.5000 g | Freq: Once | INTRAVENOUS | Status: AC

## 2021-11-04 MED ORDER — AMIODARONE HCL IN DEXTROSE 360-4.14 MG/200ML-% IV SOLN
30.0000 mg/h | INTRAVENOUS | Status: DC
  Administered 2021-11-04 – 2021-11-05 (×2): 30 mg/h via INTRAVENOUS
  Administered 2021-11-05: 60 mg/h via INTRAVENOUS
  Administered 2021-11-05 – 2021-11-08 (×6): 30 mg/h via INTRAVENOUS
  Filled 2021-11-04 (×7): qty 200

## 2021-11-04 MED ORDER — AMIODARONE HCL IN DEXTROSE 360-4.14 MG/200ML-% IV SOLN
60.0000 mg/h | INTRAVENOUS | Status: AC
  Administered 2021-11-04 (×2): 60 mg/h via INTRAVENOUS
  Filled 2021-11-04: qty 200

## 2021-11-04 MED ORDER — AMIODARONE IV BOLUS ONLY 150 MG/100ML
150.0000 mg | Freq: Once | INTRAVENOUS | Status: AC
  Administered 2021-11-04: 150 mg via INTRAVENOUS
  Filled 2021-11-04: qty 100

## 2021-11-04 MED ORDER — POTASSIUM CHLORIDE CRYS ER 20 MEQ PO TBCR
20.0000 meq | EXTENDED_RELEASE_TABLET | Freq: Three times a day (TID) | ORAL | Status: AC
  Administered 2021-11-04 (×3): 20 meq via ORAL
  Filled 2021-11-04 (×3): qty 1

## 2021-11-04 MED FILL — Electrolyte-R (PH 7.4) Solution: INTRAVENOUS | Qty: 3000 | Status: AC

## 2021-11-04 MED FILL — Heparin Sodium (Porcine) Inj 1000 Unit/ML: INTRAMUSCULAR | Qty: 10 | Status: AC

## 2021-11-04 MED FILL — Mannitol IV Soln 20%: INTRAVENOUS | Qty: 500 | Status: AC

## 2021-11-04 MED FILL — Sodium Chloride IV Soln 0.9%: INTRAVENOUS | Qty: 3000 | Status: AC

## 2021-11-04 MED FILL — Sodium Bicarbonate IV Soln 8.4%: INTRAVENOUS | Qty: 50 | Status: AC

## 2021-11-04 MED FILL — Calcium Chloride Inj 10%: INTRAVENOUS | Qty: 10 | Status: AC

## 2021-11-04 NOTE — Progress Notes (Signed)
Patient ID: [facility], female   DOB: [birth year], 50 y.o.   MRN: [id number] TCTS Evening Rounds:  She developed rapid atrial rhythm today that was reportedly atrial fib and started on IV amio with boluses but did not convert. When I looked at this evening on monitor it looked like atrial flutter 130's. I was able to rapid atrial pace to sinus 90 on first attempt. Will continue IV amio tonight.  BP 80's in rapid AF so started back on midodrine and neo.   Good diuresis today. Will hold lasix tonight and let BP settle out. Resume diuresis in am.  She has felt well today despite the rapid heart rate.

## 2021-11-04 NOTE — Progress Notes (Signed)
2 Days Post-Op Procedure(s) (LRB): AORTIC VALVE REPLACEMENT (AVR) USING INSPIRIS VALVE SIZE (N/A) TRANSESOPHAGEAL ECHOCARDIOGRAM (TEE) (N/A) APPLICATION OF CELL SAVER REPAIR OF PATENT FORAMEN OVALE Subjective: No complaints. Up in chair. Ambulated.  Co-ox 56% on milrinone 0.125  -2L yesterday. Wt down 2 lbs but still 14 lbs over preop if accurate.  Objective: Vital signs in last 24 hours: Temp:  [97.7 F (36.5 C)-100.2 F (37.9 C)] 99 F (37.2 C) (11/09 0329) Pulse Rate:  [71-110] 92 (11/09 0700) Cardiac Rhythm: Normal sinus rhythm (11/09 0400) Resp:  [13-24] 15 (11/09 0700) BP: (88-119)/(57-88) 92/68 (11/09 0700) SpO2:  [91 %-97 %] 95 % (11/09 0700) Arterial Line BP: (105-123)/(69-75) 116/75 (11/08 1000) Weight:  [72.2 kg] 72.2 kg (11/09 0500)  Hemodynamic parameters for last 24 hours: PAP: (36-39)/(21-24) 36/24 CO:  [4.1 L/min-4.3 L/min] 4.3 L/min CI:  [2.3 L/min/m2-2.5 L/min/m2] 2.5 L/min/m2  Intake/Output from previous day: 11/08 0701 - 11/09 0700 In: 958.8 [P.O.:120; I.V.:538.7; IV Piggyback:300.1] Out: 3025 [Urine:3025] Intake/Output this shift: No intake/output data recorded.  General appearance: alert and cooperative Neurologic: intact Heart: regular rate and rhythm, S1, S2 normal, no murmur Lungs: diminished breath sounds bilaterally Extremities: no edema Wound: dressing dry  Lab Results: Recent Labs    11/03/21 1700 11/04/21 0412  WBC 9.7 8.3  HGB 11.0* 10.8*  HCT 33.6* 33.1*  PLT 128* 126*   BMET:  Recent Labs    11/03/21 1700 11/04/21 0412  NA 133* 133*  K 4.1 4.5  CL 99 99  CO2 27 28  GLUCOSE 156* 91  BUN 9 8  CREATININE 0.76 0.75  CALCIUM 8.5* 8.4*    PT/INR:  Recent Labs    11/02/21 1307  LABPROT 16.9*  INR 1.4*   ABG    Component Value Date/Time   PHART 7.374 11/02/2021 1701   HCO3 25.5 11/02/2021 1701   TCO2 27 11/02/2021 1701   ACIDBASEDEF 2.0 11/02/2021 1530   O2SAT 56.0 11/04/2021 0412   CBG (last 3)   Recent Labs    11/03/21 2000 11/03/21 2320 11/04/21 0327  GLUCAP 96 99 76   CXR: small bilateral pleural effusions and bibasilar atelectasis.  Assessment/Plan: S/P Procedure(s) (LRB): AORTIC VALVE REPLACEMENT (AVR) USING INSPIRIS VALVE SIZE (N/A) TRANSESOPHAGEAL ECHOCARDIOGRAM (TEE) (N/A) APPLICATION OF CELL SAVER REPAIR OF PATENT FORAMEN OVALE  POD 2 Hemodynamically stable on milrinone 0.125 with borderline Co-ox of 56%. Will continue milrinone today and continue diuresis. Preop EF 10-15%. Rhythm is sinus 100.  DC foley.  IS, ambulation.  Glucose under good control. DC CBG's   LOS: 2 days    Alleen Borne 11/04/2021

## 2021-11-04 NOTE — Consult Note (Addendum)
Advanced Heart Failure Team Consult Note   Primary Physician: Patient, No Pcp Per (Inactive) PCP-Cardiologist:  None  Reason for Consultation: severe aortic insufficiency/LV dysfunction  HPI:    Pamela Bryan is seen today for evaluation of severe aortic insufficiency/systolic HF at the request of Dr. Laneta Simmers .  Pamela Bryan is a 50 y.o. female with PMH of alcohol use disorder, tobacco use disorder and systolic HF due to NICM.   Admitted with 3/22 with acute systolic HF. EF 15-20% Cath with preserved cardiac output and severely reduced EF. MRI EF 11% concerning for possible myocarditis. Started on GDMT. Counseled stop drinking alcohol.    Echo 8/22 EF 20% severely dilated RV normal AoV functionally bicuspid mild AS at least moderate AI (reviewed by Dr. Gala Romney).    Losartan added at her follow up and she was scheduled for TEE to evaluate AV (? Bicuspid & severe AI) but cancelled as she did not have anyone to take her to procedure.   TEE (9/22) EF 20% with a severely dilated left ventricle and severe aortic insufficiency.  Right ventricular systolic function is moderately depressed.   Admitted for scheduled AVR on 11/7. Interoperative TEE showed EF 10% with severely dilated LV.   Now extubated. Sitting in chair. Epi stopped overnight. Now on milrinone 0.125. Co-ox 56%      Review of Systems: [y] = yes, [ ]  = no   General: Weight gain [ ] ; Weight loss [ ] ; Anorexia [ ] ; Fatigue [ y]; Fever [ ] ; Chills [ ] ; Weakness [ ]   Cardiac: Chest pain/pressure [ ] ; Resting SOB [ ] ; Exertional SOB [ ] ; Orthopnea [ ] ; Pedal Edema [ ] ; Palpitations [ ] ; Syncope [ ] ; Presyncope [ ] ; Paroxysmal nocturnal dyspnea[ ]   Pulmonary: Cough [ ] ; Wheezing[ ] ; Hemoptysis[ ] ; Sputum [ ] ; Snoring [ ]   GI: Vomiting[ ] ; Dysphagia[ ] ; Melena[ ] ; Hematochezia [ ] ; Heartburn[ ] ; Abdominal pain [ ] ; Constipation [ ] ; Diarrhea [ ] ; BRBPR [ ]   GU: Hematuria[ ] ; Dysuria [ ] ; Nocturia[ ]   Vascular: Pain in legs  with walking [ ] ; Pain in feet with lying flat [ ] ; Non-healing sores [ ] ; Stroke [ ] ; TIA [ ] ; Slurred speech [ ] ;  Neuro: Headaches[ ] ; Vertigo[ ] ; Seizures[ ] ; Paresthesias[ ] ;Blurred vision [ ] ; Diplopia [ ] ; Vision changes [ ]   Ortho/Skin: Arthritis Cove.[place]ienne ]; Joint pain Cove.Etienne ]; Muscle pain [ ] ; Joint swelling [ ] ; Back Pain [ ] ; Rash [ ]   Psych: Depression[ ] ; Anxiety[ ]   Heme: Bleeding problems [ ] ; Clotting disorders [ ] ; Anemia [ ]   Endocrine: Diabetes [ ] ; Thyroid dysfunction[ ]   Home Medications Prior to Admission medications   Medication Sig Start Date End Date Taking? Authorizing Provider  buPROPion (WELLBUTRIN SR) 150 MG 12 hr tablet Take 150 mg by mouth 2 (two) times daily.   Yes [provider]  carvedilol (COREG) 3.125 MG tablet Take 1 tablet (3.125 mg total) by mouth 2 (two) times daily. 09/02/21 12/01/21 Yes Milford, Anderson Malta, FNP  dapagliflozin propanediol (FARXIGA) 10 MG TABS tablet Take 1 tablet (10 mg total) by mouth daily. 05/27/21  Yes Clegg, Amy D, NP  digoxin (LANOXIN) 0.125 MG tablet Take 1 tablet (0.125 mg total) by mouth daily. 03/17/21 07/30/22 Yes Shah, Pratik D, DO  diphenhydrAMINE (BENADRYL) 25 MG tablet Take 25 mg by mouth at bedtime as needed for allergies.   Yes [provider]  furosemide (LASIX) 40 MG tablet TAKE 1 TABLET (40  MG TOTAL) BY MOUTH DAILY AS NEEDED FOR FLUID OR EDEMA. 09/04/21  Yes Charlyne Robertshaw, Shaune Pascal, MD  ivabradine (CORLANOR) 7.5 MG TABS tablet Take 1 tablet (7.5 mg total) by mouth 2 (two) times daily with a meal. 07/30/21  Yes Case Vassell, Shaune Pascal, MD  midodrine (PROAMATINE) 5 MG tablet Take 0.5 tablets (2.5 mg total) by mouth 2 (two) times daily with a meal. 05/27/21  Yes Clegg, Amy D, NP  Multiple Vitamin (MULTIVITAMIN) capsule Take 1 capsule by mouth daily.   Yes [provider]  spironolactone (ALDACTONE) 25 MG tablet Take 0.5 tablets (12.5 mg total) by mouth daily. 09/02/21  Yes Milford, Maricela Bo, FNP  levonorgestrel (LILETTA, 52  MG,) 19.5 MCG/DAY IUD IUD 1 Intra Uterine Device by Intrauterine route once. 04/23/16   [provider]  losartan (COZAAR) 25 MG tablet Take 1 tablet (25 mg total) by mouth at bedtime. 07/30/21 10/30/21  Tashona Calk, Shaune Pascal, MD    Past Medical History: Past Medical History:  Diagnosis Date   CHF (congestive heart failure) ([place])    Coronary artery disease    History of hiatal hernia    Hypertension     Past Surgical History: Past Surgical History:  Procedure Laterality Date   AORTIC VALVE REPLACEMENT N/A 11/02/2021   Procedure: AORTIC VALVE REPLACEMENT (AVR) USING INSPIRIS VALVE SIZE 25MM;  Surgeon: Gaye Pollack, MD;  Location: [place];  Service: Open Heart Surgery;  Laterality: N/A;   CHOLECYSTECTOMY     REPAIR OF PATENT FORAMEN OVALE  11/02/2021   Procedure: REPAIR OF PATENT FORAMEN OVALE;  Surgeon: Gaye Pollack, MD;  Location: [place];  Service: Open Heart Surgery;;   RIGHT HEART CATH N/A 10/30/2021   Procedure: RIGHT HEART CATH;  Surgeon: Jolaine Artist, MD;  Location: [place] [facility];  Service: Cardiovascular;  Laterality: N/A;   RIGHT/LEFT HEART CATH AND CORONARY ANGIOGRAPHY N/A 03/13/2021   Procedure: RIGHT/LEFT HEART CATH AND CORONARY ANGIOGRAPHY;  Surgeon: Jolaine Artist, MD;  Location: [place] [facility];  Service: Cardiovascular;  Laterality: N/A;   TEE WITHOUT CARDIOVERSION N/A 09/11/2021   Procedure: TRANSESOPHAGEAL ECHOCARDIOGRAM (TEE);  Surgeon: Jolaine Artist, MD;  [facility];  Service: Cardiovascular;  Laterality: N/A;   TEE WITHOUT CARDIOVERSION N/A 11/02/2021   Procedure: TRANSESOPHAGEAL ECHOCARDIOGRAM (TEE);  Surgeon: Gaye Pollack, MD;  Location: [place];  Service: Open Heart Surgery;  Laterality: N/A;   TUBAL LIGATION      Family History: Family History  Problem Relation Age of Onset   Valvular heart disease Sister     Social History: Social History   Socioeconomic History   Marital status: Divorced    Spouse name: Not  on file   Number of children: Not on file   Years of education: Not on file   Highest education level: Not on file  Occupational History   Not on file  Tobacco Use   Smoking status: Former    Packs/day: 1.00    Years: 30.00    Pack years: 30.00    Types: Cigarettes    Quit date: 09/26/2020    Years since quitting: 1.1   Smokeless tobacco: Never  Vaping Use   Vaping Use: Never used  Substance and Sexual Activity   Alcohol use: Yes    Comment: occasional   Drug use: No   Sexual activity: Not on file  Other Topics Concern   Not on file  Social History Narrative   Not on file   Social  Determinants of Health   Financial Resource Strain: Not on file  Food Insecurity: Not on file  Transportation Needs: Not on file  Physical Activity: Not on file  Stress: Not on file  Social Connections: Not on file    Allergies:  No Known Allergies  Objective:    Vital Signs:   Temp:  [98 F (36.7 C)-100.2 F (37.9 C)] 98.2 F (36.8 C) (11/09 0715) Pulse Rate:  [71-110] 94 (11/09 0900) Resp:  [13-24] 21 (11/09 0900) BP: (88-120)/(57-88) 120/64 (11/09 0900) SpO2:  [91 %-100 %] 100 % (11/09 0900) Arterial Line BP: (116)/(75) 116/75 (11/08 1000) Weight:  [72.2 kg] 72.2 kg (11/09 0500) Last BM Date:  (PTA)  Weight change: Filed Weights   11/02/21 0601 11/03/21 0500 11/04/21 0500  Weight: 65.8 kg 73.1 kg 72.2 kg    Intake/Output:   Intake/Output Summary (Last 24 hours) at 11/04/2021 0909 Last data filed at 11/04/2021 0700 Gross per 24 hour  Intake 760 ml  Output 3025 ml  Net -2265 ml      Physical Exam    General:  Sitting in chair No resp difficulty HEENT: normal Neck: supple. RIJ introducer Carotids 2+ bilat; no bruits. No lymphadenopathy or thyromegaly appreciated. Cor: Sternal dressing  PMI nondisplaced. Regular rate & rhythm. No rubs, gallops or murmurs. Lungs: clear Abdomen: soft, nontender, mildly distended. No hepatosplenomegaly. No bruits or masses. Good bowel  sounds. Extremities: no cyanosis, clubbing, rash, tr-1+ edema Neuro: alert & orientedx3, cranial nerves grossly intact. moves all 4 extremities w/o difficulty. Affect pleasant   Telemetry   Sinus 100-105 with PVCs. Personally reviewed  Labs   Basic Metabolic Panel: Recent Labs  Lab 10/30/21 1030 11/02/21 0904 11/02/21 1139 11/02/21 1314 11/02/21 1701 11/02/21 1900 11/03/21 0446 11/03/21 1700 11/04/21 0412  NA 138   < > 138   < > 140 138 134* 133* 133*  K 3.8   < > 4.7   < > 4.3 4.0 3.9 4.1 4.5  CL 106   < > 105  --   --  106 106 99 99  CO2 21*  --   --   --   --  23 23 27 28   GLUCOSE 88   < > 141*  --   --  161* 106* 156* 91  BUN 18   < > 13  --   --  17 10 9 8   CREATININE 0.79   < > 0.70  --   --  0.80 0.68 0.76 0.75  CALCIUM 8.7*  --   --   --   --  8.0* 8.1* 8.5* 8.4*  MG  --   --   --   --   --  3.0* 2.1 1.8  --    < > = values in this interval not displayed.    Liver Function Tests: Recent Labs  Lab 10/30/21 1030  AST 30  ALT 18  ALKPHOS 62  BILITOT 0.7  PROT 5.7*  ALBUMIN 3.5   No results for input(s): LIPASE, AMYLASE in the last 168 hours. No results for input(s): AMMONIA in the last 168 hours.  CBC: Recent Labs  Lab 11/02/21 1307 11/02/21 1314 11/02/21 1701 11/02/21 1900 11/03/21 0446 11/03/21 1700 11/04/21 0412  WBC 11.1*  --   --  10.5 10.6* 9.7 8.3  HGB 11.3*   < > 11.2* 11.1* 10.8* 11.0* 10.8*  HCT 34.5*   < > 33.0* 33.9* 32.7* 33.6* 33.1*  MCV 100.3*  --   --  99.4 100.0 101.5* 101.5*  PLT 146*  --   --  153 139* 128* 126*   < > = values in this interval not displayed.    Cardiac Enzymes: No results for input(s): CKTOTAL, CKMB, CKMBINDEX, TROPONINI in the last 168 hours.  BNP: BNP (last 3 results) Recent Labs    03/10/21 2253 03/13/21 0953 07/30/21 1504  BNP 2,348.9* 2,254.2* 677.2*    ProBNP (last 3 results) No results for input(s): PROBNP in the last 8760 hours.   CBG: Recent Labs  Lab 11/03/21 1140 11/03/21 1621  11/03/21 2000 11/03/21 2320 11/04/21 0327  GLUCAP 128* 119* 96 99 76    Coagulation Studies: Recent Labs    11/02/21 1307  LABPROT 16.9*  INR 1.4*     Imaging   DG Chest Port 1 View  Result Date: 11/04/2021 CLINICAL DATA:  Sore chest EXAM: PORTABLE CHEST 1 VIEW COMPARISON:  11/03/2021 FINDINGS: Removal of mediastinal drain and Swan-Ganz catheter. Central venous sheath remains. Midline sternotomy and prosthetic bowel noted. LEFT lower lobe atelectasis is unchanged. Mild bibasilar venous congestion similar. No pneumothorax IMPRESSION: 1. Persistent LEFT lower lobe atelectasis and lower lobe venous congestion. 2. No pneumothorax or pulmonary edema Electronically Signed   By: Suzy Bouchard M.D.   On: 11/04/2021 08:47     Medications:     Current Medications:  acetaminophen  1,000 mg Oral Q6H   Or   acetaminophen (TYLENOL) oral liquid 160 mg/5 mL  1,000 mg Per Tube Q6H   aspirin EC  325 mg Oral Daily   Or   aspirin  324 mg Per Tube Daily   bisacodyl  10 mg Oral Daily   Or   bisacodyl  10 mg Rectal Daily   buPROPion  150 mg Oral BID   chlorhexidine  15 mL Mouth Rinse BID   Chlorhexidine Gluconate Cloth  6 each Topical Daily   digoxin  0.125 mg Oral Daily   docusate sodium  200 mg Oral Daily   enoxaparin (LOVENOX) injection  40 mg Subcutaneous QHS   furosemide  40 mg Intravenous BID   mouth rinse  15 mL Mouth Rinse q12n4p   pantoprazole  40 mg Oral Daily   potassium chloride  20 mEq Oral TID   sodium chloride flush  10-40 mL Intracatheter Q12H   sodium chloride flush  3 mL Intravenous Q12H    Infusions:  sodium chloride     sodium chloride     sodium chloride 20 mL/hr at 11/02/21 1250    ceFAZolin (ANCEF) IV Stopped (11/04/21 0443)   epinephrine Stopped (11/03/21 0827)   lactated ringers     lactated ringers 20 mL/hr at 11/04/21 0700   milrinone 0.125 mcg/kg/min (11/04/21 0700)      Assessment/Plan   1. Severe aortic insufficiency  - s/p bioprosthetic AVR  11/7 - continue to mobilize post-op   2. Acute on chronic systolic HF due to NICM  - EF 10-15% severely dilated LV - co-ox 56% on milrinone 0.125. will continue - volume up. Agree with diuresis.  - Restart GDMT slowly. Start spiro today - will await to see if EF recovers with AVR  Length of Stay: 2  Amy Clegg, NP-C  Advanced Heart Failure Team Pager [phone] (M-F; 7a - 5p)  Please contact [facility] for night-coverage after hours (4p -7a ) and weekends on [email or web address]  Agree with above.   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 Now s/p AVR on 11/7. EF 10-15%  Off epi.  Co-ox marginal on milrinone 0.125. Volume up   General:  Sitting up in chair No resp difficulty HEENT: normal Neck: supple. RIJ introducer Carotids 2+ bilat; no bruits. No lymphadenopathy or thryomegaly appreciated. Cor: Sternal dressing ok PMI nondisplaced. Regular rate & rhythm. No rubs, gallops or murmurs. Lungs: clear Abdomen: soft, nontender, + distended. No hepatosplenomegaly. Hypoactive bowel sounds. Extremities: no cyanosis, clubbing, rash, 1+ edema Neuro: alert & orientedx3, cranial nerves grossly intact. moves all 4 extremities w/o difficulty. Affect pleasant  She remains tenuous. Now off Epi. Continue milrinone until full diureses. Start back GDMT slowly. We discussed fact that LV may or may not improve with AVR and we will need to follow closely to decide on next steps over time.   CRITICAL CARE Performed by: Arvilla Meres  Total critical care time: 40 minutes  Critical care time was exclusive of separately billable procedures and treating other patients.  Critical care was necessary to treat or prevent imminent or life-threatening deterioration.  Critical care was time spent personally by me (independent of midlevel providers or residents) on the following activities: development of treatment plan with patient and/or surrogate as well as nursing, discussions with consultants, evaluation of  patient's response to treatment, examination of patient, obtaining history from patient or surrogate, ordering and performing treatments and interventions, ordering and review of laboratory studies, ordering and review of radiographic studies, pulse oximetry and re-evaluation of patient's condition.  Arvilla Meres, MD  10:57 AM

## 2021-11-04 NOTE — Progress Notes (Signed)
  Amiodarone Drug - Drug Interaction Consult Note  Recommendations:  Dose of digoxin should be halved when amiodarone started  Monitor and replace K while on furosemide  Monitor Qtc while on ondansetron  Amiodarone is metabolized by the cytochrome P450 system and therefore has the potential to cause many drug interactions. Amiodarone has an average plasma half-life of 50 days (range 20 to 100 days).   There is potential for drug interactions to occur several weeks or months after stopping treatment and the onset of drug interactions may be slow after initiating amiodarone.   []  Statins: Increased risk of myopathy. Simvastatin- restrict dose to 20mg  daily. Other statins: counsel patients to report any muscle pain or weakness immediately.  []  Anticoagulants: Amiodarone can increase anticoagulant effect. Consider warfarin dose reduction. Patients should be monitored closely and the dose of anticoagulant altered accordingly, remembering that amiodarone levels take several weeks to stabilize.  []  Antiepileptics: Amiodarone can increase plasma concentration of phenytoin, the dose should be reduced. Note that small changes in phenytoin dose can result in large changes in levels. Monitor patient and counsel on signs of toxicity.  []  Beta blockers: increased risk of bradycardia, AV block and myocardial depression. Sotalol - avoid concomitant use.  []   Calcium channel blockers (diltiazem and verapamil): increased risk of bradycardia, AV block and myocardial depression.  []   Cyclosporine: Amiodarone increases levels of cyclosporine. Reduced dose of cyclosporine is recommended.  [x]  Digoxin dose should be halved when amiodarone is started.  [x]  Diuretics: increased risk of cardiotoxicity if hypokalemia occurs.  []  Oral hypoglycemic agents (glyburide, glipizide, glimepiride): increased risk of hypoglycemia. Patient's glucose levels should be monitored closely when initiating amiodarone therapy.    [x]  Drugs that prolong the QT interval:  Torsades de pointes risk may be increased with concurrent use - avoid if possible.  Monitor QTc, also keep magnesium/potassium WNL if concurrent therapy can't be avoided.  Antibiotics: e.g. fluoroquinolones, erythromycin.  Antiarrhythmics: e.g. quinidine, procainamide, disopyramide, sotalol.  Antipsychotics: e.g. phenothiazines, haloperidol.   Lithium, tricyclic antidepressants, and methadone.  Ondansetron  , PharmD, BCPS, [facility] Clinical Pharmacist 11/04/2021 3:04 PM

## 2021-11-05 DIAGNOSIS — I4892 Unspecified atrial flutter: Secondary | ICD-10-CM

## 2021-11-05 DIAGNOSIS — I5023 Acute on chronic systolic (congestive) heart failure: Secondary | ICD-10-CM | POA: Diagnosis not present

## 2021-11-05 DIAGNOSIS — Z952 Presence of prosthetic heart valve: Secondary | ICD-10-CM | POA: Diagnosis not present

## 2021-11-05 LAB — CBC
HCT: 29.7 % — ABNORMAL LOW (ref 36.0–46.0)
Hemoglobin: 9.7 g/dL — ABNORMAL LOW (ref 12.0–15.0)
MCH: 33.1 pg (ref 26.0–34.0)
MCHC: 32.7 g/dL (ref 30.0–36.0)
MCV: 101.4 fL — ABNORMAL HIGH (ref 80.0–100.0)
Platelets: 130 10*3/uL — ABNORMAL LOW (ref 150–400)
RBC: 2.93 MIL/uL — ABNORMAL LOW (ref 3.87–5.11)
RDW: 12.6 % (ref 11.5–15.5)
WBC: 7 10*3/uL (ref 4.0–10.5)
nRBC: 0 % (ref 0.0–0.2)

## 2021-11-05 LAB — BASIC METABOLIC PANEL
Anion gap: 7 (ref 5–15)
BUN: 10 mg/dL (ref 6–20)
CO2: 24 mmol/L (ref 22–32)
Calcium: 8.3 mg/dL — ABNORMAL LOW (ref 8.9–10.3)
Chloride: 101 mmol/L (ref 98–111)
Creatinine, Ser: 0.56 mg/dL (ref 0.44–1.00)
GFR, Estimated: >60 mL/min (ref >60)
Glucose, Bld: 91 mg/dL (ref 70–99)
Potassium: 4.3 mmol/L (ref 3.5–5.1)
Sodium: 132 mmol/L — ABNORMAL LOW (ref 135–145)

## 2021-11-05 LAB — COOXEMETRY PANEL
Carboxyhemoglobin: 0.9 % (ref 0.5–1.5)
Methemoglobin: 1 % (ref 0.0–1.5)
O2 Saturation: 56.6 %
Total hemoglobin: 9.5 g/dL — ABNORMAL LOW (ref 12.0–16.0)

## 2021-11-05 LAB — MAGNESIUM: Magnesium: 1.6 mg/dL — ABNORMAL LOW (ref 1.7–2.4)

## 2021-11-05 MED ORDER — FUROSEMIDE 10 MG/ML IJ SOLN
40.0000 mg | Freq: Two times a day (BID) | INTRAMUSCULAR | Status: DC
  Administered 2021-11-05 – 2021-11-08 (×7): 40 mg via INTRAVENOUS
  Filled 2021-11-05 (×7): qty 4

## 2021-11-05 MED ORDER — POTASSIUM CHLORIDE CRYS ER 20 MEQ PO TBCR
20.0000 meq | EXTENDED_RELEASE_TABLET | Freq: Three times a day (TID) | ORAL | Status: AC
  Administered 2021-11-05 (×3): 20 meq via ORAL
  Filled 2021-11-05 (×3): qty 1

## 2021-11-05 MED ORDER — METOLAZONE 2.5 MG PO TABS
2.5000 mg | ORAL_TABLET | Freq: Once | ORAL | Status: AC
  Administered 2021-11-05: 2.5 mg via ORAL
  Filled 2021-11-05: qty 1

## 2021-11-05 MED ORDER — MAGNESIUM SULFATE 4 GM/100ML IV SOLN
4.0000 g | Freq: Once | INTRAVENOUS | Status: AC
  Administered 2021-11-05: 4 g via INTRAVENOUS
  Filled 2021-11-05: qty 100

## 2021-11-05 NOTE — Progress Notes (Signed)
3 Days Post-Op Procedure(s) (LRB): AORTIC VALVE REPLACEMENT (AVR) USING INSPIRIS VALVE SIZE (N/A) TRANSESOPHAGEAL ECHOCARDIOGRAM (TEE) (N/A) APPLICATION OF CELL SAVER REPAIR OF PATENT FORAMEN OVALE Subjective: No complaints. Got some sleep. Ambulated two laps around unit this am.  Objective: Vital signs in last 24 hours: Temp:  [98.2 F (36.8 C)-99.2 F (37.3 C)] 98.9 F (37.2 C) (11/10 0449) Pulse Rate:  [34-178] 71 (11/10 0500) Cardiac Rhythm: Normal sinus rhythm (11/10 0400) Resp:  [14-30] 30 (11/10 0600) BP: (67-120)/(56-82) 102/74 (11/10 0500) SpO2:  [89 %-100 %] 95 % (11/10 0500) Weight:  [73.4 kg] 73.4 kg (11/10 0300)  Hemodynamic parameters for last 24 hours:    Intake/Output from previous day: 11/09 0701 - 11/10 0700 In: 2616.3 [P.O.:1200; I.V.:1316.3; IV Piggyback:100] Out: 1025 [Urine:1025] Intake/Output this shift: No intake/output data recorded.  General appearance: alert and cooperative Neurologic: intact Heart: regular rate and rhythm, S1, S2 normal, no murmur Lungs: diminished breath sounds bibasilar Extremities: edema mild Wound: incision healing well.  Lab Results: Recent Labs    11/04/21 0412 11/05/21 0418  WBC 8.3 7.0  HGB 10.8* 9.7*  HCT 33.1* 29.7*  PLT 126* 130*   BMET:  Recent Labs    11/04/21 0412 11/05/21 0418  NA 133* 132*  K 4.5 4.3  CL 99 101  CO2 28 24  GLUCOSE 91 91  BUN 8 10  CREATININE 0.75 0.56  CALCIUM 8.4* 8.3*    PT/INR:  Recent Labs    11/02/21 1307  LABPROT 16.9*  INR 1.4*   ABG    Component Value Date/Time   PHART 7.374 11/02/2021 1701   HCO3 25.5 11/02/2021 1701   TCO2 27 11/02/2021 1701   ACIDBASEDEF 2.0 11/02/2021 1530   O2SAT 56.6 11/05/2021 0418   CBG (last 3)  Recent Labs    11/03/21 2000 11/03/21 2320 11/04/21 0327  GLUCAP 96 99 76    Assessment/Plan: S/P Procedure(s) (LRB): AORTIC VALVE REPLACEMENT (AVR) USING INSPIRIS VALVE SIZE (N/A) TRANSESOPHAGEAL ECHOCARDIOGRAM  (TEE) (N/A) APPLICATION OF CELL SAVER REPAIR OF PATENT FORAMEN OVALE  POD 3 Hemodynamically stable overnight. Neo weaned off this am. Co-ox 57% on milrinone 0.125. Rhythm is sinus 70's this am. Had some tachycardia overnight that resolved spontaneously.Continue IV amio. Heart failure team (DB) following.  Volume excess: + 1600 cc yesterday. Wt up 3 lbs. 17 lbs over preop. Continue diuresis and KCL. CVP measured at 5 this am per DB.  IS, ambulation. Will repeat CXR in am to followup on pleural effusions and atelectasis.    LOS: 3 days    Pamela Bryan 11/05/2021

## 2021-11-05 NOTE — Progress Notes (Addendum)
Advanced Heart Failure Rounding Note   Subjective:    Remains on milrinone at 0.125 and neo 10.   Developed AFL in 130s overnight and broke with RAP by Dr. Laneta Simmers. Now on IV amio. In sinus brady. SBP 90s on midodrine 10 tid  Good BM. Diuresed modestly with one dose IV lasix yesterday (pm dose held with AFL). No weight recorded yet. Still feels bloated. CVP 5. Co-ox 57%  Walking halls. No CP, mild dyspnea.    Objective:   Weight Range:  Vital Signs:   Temp:  [98.2 F (36.8 C)-99.2 F (37.3 C)] 98.9 F (37.2 C) (11/10 0449) Pulse Rate:  [34-178] 123 (11/10 0100) Resp:  [15-26] 17 (11/10 0100) BP: (67-120)/(56-82) 90/78 (11/10 0100) SpO2:  [89 %-100 %] 97 % (11/10 0100) Last BM Date:  (PTA)  Weight change: Filed Weights   11/02/21 0601 11/03/21 0500 11/04/21 0500  Weight: 65.8 kg 73.1 kg 72.2 kg    Intake/Output:   Intake/Output Summary (Last 24 hours) at 11/05/2021 0636 Last data filed at 11/05/2021 0324 Gross per 24 hour  Intake 2011.1 ml  Output 1025 ml  Net 986.1 ml     Physical Exam: General:  Sitting in chair No resp difficulty HEENT: normal Neck: supple. RIJ introducer. Carotids 2+ bilat; no bruits. No lymphadenopathy or thryomegaly appreciated. Cor: Sternal wound ok  Regular rate & rhythm. No rubs, gallops or murmurs. Lungs: clear Abdomen: soft, nontender, mildly distended. No hepatosplenomegaly. No bruits or masses. Good bowel sounds. Extremities: no cyanosis, clubbing, rash, edema Neuro: alert & orientedx3, cranial nerves grossly intact. moves all 4 extremities w/o difficulty. Affect pleasant  Telemetry: Sinus brady 50-60 AFL 130s overnight. Personally reviewed   Labs: Basic Metabolic Panel: Recent Labs  Lab 11/02/21 1900 11/03/21 0446 11/03/21 1700 11/04/21 0412 11/05/21 0418  NA 138 134* 133* 133* 132*  K 4.0 3.9 4.1 4.5 4.3  CL 106 106 99 99 101  CO2 23 23 27 28 24   GLUCOSE 161* 106* 156* 91 91  BUN 17 10 9 8 10   CREATININE 0.80  0.68 0.76 0.75 0.56  CALCIUM 8.0* 8.1* 8.5* 8.4* 8.3*  MG 3.0* 2.1 1.8  --   --     Liver Function Tests: Recent Labs  Lab 10/30/21 1030  AST 30  ALT 18  ALKPHOS 62  BILITOT 0.7  PROT 5.7*  ALBUMIN 3.5   No results for input(s): LIPASE, AMYLASE in the last 168 hours. No results for input(s): AMMONIA in the last 168 hours.  CBC: Recent Labs  Lab 11/02/21 1900 11/03/21 0446 11/03/21 1700 11/04/21 0412 11/05/21 0418  WBC 10.5 10.6* 9.7 8.3 7.0  HGB 11.1* 10.8* 11.0* 10.8* 9.7*  HCT 33.9* 32.7* 33.6* 33.1* 29.7*  MCV 99.4 100.0 101.5* 101.5* 101.4*  PLT 153 139* 128* 126* 130*    Cardiac Enzymes: No results for input(s): CKTOTAL, CKMB, CKMBINDEX, TROPONINI in the last 168 hours.  BNP: BNP (last 3 results) Recent Labs    03/10/21 2253 03/13/21 0953 07/30/21 1504  BNP 2,348.9* 2,254.2* 677.2*    ProBNP (last 3 results) No results for input(s): PROBNP in the last 8760 hours.    Other results:  Imaging: DG Chest Port 1 View  Result Date: 11/04/2021 CLINICAL DATA:  Sore chest EXAM: PORTABLE CHEST 1 VIEW COMPARISON:  11/03/2021 FINDINGS: Removal of mediastinal drain and Swan-Ganz catheter. Central venous sheath remains. Midline sternotomy and prosthetic bowel noted. LEFT lower lobe atelectasis is unchanged. Mild bibasilar venous congestion similar. No pneumothorax  IMPRESSION: 1. Persistent LEFT lower lobe atelectasis and lower lobe venous congestion. 2. No pneumothorax or pulmonary edema Electronically Signed   By: Genevive Bi M.D.   On: 11/04/2021 08:47     Medications:     Scheduled Medications:  acetaminophen  1,000 mg Oral Q6H   Or   acetaminophen (TYLENOL) oral liquid 160 mg/5 mL  1,000 mg Per Tube Q6H   aspirin EC  325 mg Oral Daily   Or   aspirin  324 mg Per Tube Daily   bisacodyl  10 mg Oral Daily   Or   bisacodyl  10 mg Rectal Daily   buPROPion  150 mg Oral BID   chlorhexidine  15 mL Mouth Rinse BID   Chlorhexidine Gluconate Cloth  6 each  Topical Daily   digoxin  0.125 mg Oral Daily   docusate sodium  200 mg Oral Daily   enoxaparin (LOVENOX) injection  40 mg Subcutaneous QHS   furosemide  40 mg Intravenous BID   mouth rinse  15 mL Mouth Rinse q12n4p   midodrine  10 mg Oral TID WC   pantoprazole  40 mg Oral Daily   sodium chloride flush  10-40 mL Intracatheter Q12H   sodium chloride flush  3 mL Intravenous Q12H    Infusions:  sodium chloride     sodium chloride     sodium chloride 20 mL/hr at 11/02/21 1250   amiodarone 60 mg/hr (11/05/21 0324)   lactated ringers     lactated ringers 10 mL/hr at 11/05/21 0324   milrinone 0.125 mcg/kg/min (11/05/21 0324)   phenylephrine (NEO-SYNEPHRINE) Adult infusion 10 mcg/min (11/05/21 0324)    PRN Medications: sodium chloride, morphine injection, ondansetron (ZOFRAN) IV, oxyCODONE, sodium chloride flush, sodium chloride flush, traMADol   Assessment/Plan   1. Severe aortic insufficiency  - s/p bioprosthetic AVR 11/7 - stable post-op.    2. Acute on chronic systolic HF due to NICM  - EF 27-51% severely dilated LV - co-ox 57% on milrinone 0.125. will continue until better diuresed - Weight still up 15 pounds but doesn't seem that edematous on exam - Resume IV lasix today. Give one dose metolazone - Continue spiro - Currently on midodrine for BP support  - titrate GDMT as BP tolerates - will await to see if EF recovers with AVR   3. Paroxysmal AFL, post-op - successful RAP on 11/19 - continue IV amio for now    Length of Stay: 3   Arvilla Meres MD 11/05/2021, 6:36 AM  Advanced Heart Failure Team Pager [phone] (M-F; 7a - 4p)  Please contact [facility] for night-coverage after hours (4p -7a ) and weekends on [email or web address]

## 2021-11-05 NOTE — Progress Notes (Signed)
      [address]       [place] [address]             [phone]      POD # 3 AVR  BP 98/72   Pulse 76   Temp 99.3 F (37.4 C) (Oral)   Resp (!) 22   Ht 5\' 7"  (1.702 m)   Wt 73.4 kg   SpO2 92%   BMI 25.34 kg/m  2L [place] 93% SAT   Intake/Output Summary (Last 24 hours) at 11/05/2021 2036 Last data filed at 11/05/2021 1845 Gross per 24 hour  Intake 2217.87 ml  Output 4200 ml  Net -1982.13 ml    No PM labs  Diuresing well  13/09/2021 C. Viviann Spare, MD [facility] [phone]

## 2021-11-06 ENCOUNTER — Other Ambulatory Visit (HOSPITAL_COMMUNITY): Payer: Self-pay

## 2021-11-06 ENCOUNTER — Inpatient Hospital Stay (HOSPITAL_COMMUNITY): Payer: No Typology Code available for payment source

## 2021-11-06 ENCOUNTER — Telehealth (HOSPITAL_COMMUNITY): Payer: Self-pay | Admitting: Pharmacy Technician

## 2021-11-06 DIAGNOSIS — I5023 Acute on chronic systolic (congestive) heart failure: Secondary | ICD-10-CM | POA: Diagnosis not present

## 2021-11-06 DIAGNOSIS — Z952 Presence of prosthetic heart valve: Secondary | ICD-10-CM | POA: Diagnosis not present

## 2021-11-06 LAB — CBC
HCT: 30 % — ABNORMAL LOW (ref 36.0–46.0)
Hemoglobin: 9.9 g/dL — ABNORMAL LOW (ref 12.0–15.0)
MCH: 32.9 pg (ref 26.0–34.0)
MCHC: 33 g/dL (ref 30.0–36.0)
MCV: 99.7 fL (ref 80.0–100.0)
Platelets: 171 10*3/uL (ref 150–400)
RBC: 3.01 MIL/uL — ABNORMAL LOW (ref 3.87–5.11)
RDW: 12.3 % (ref 11.5–15.5)
WBC: 6 10*3/uL (ref 4.0–10.5)
nRBC: 0 % (ref 0.0–0.2)

## 2021-11-06 LAB — COOXEMETRY PANEL
Carboxyhemoglobin: 0.8 % (ref 0.5–1.5)
Methemoglobin: 0.9 % (ref 0.0–1.5)
O2 Saturation: 63.4 %
Total hemoglobin: 10.2 g/dL — ABNORMAL LOW (ref 12.0–16.0)

## 2021-11-06 LAB — BASIC METABOLIC PANEL
Anion gap: 7 (ref 5–15)
BUN: 8 mg/dL (ref 6–20)
CO2: 29 mmol/L (ref 22–32)
Calcium: 8.4 mg/dL — ABNORMAL LOW (ref 8.9–10.3)
Chloride: 97 mmol/L — ABNORMAL LOW (ref 98–111)
Creatinine, Ser: 0.66 mg/dL (ref 0.44–1.00)
GFR, Estimated: >60 mL/min (ref >60)
Glucose, Bld: 88 mg/dL (ref 70–99)
Potassium: 3.7 mmol/L (ref 3.5–5.1)
Sodium: 133 mmol/L — ABNORMAL LOW (ref 135–145)

## 2021-11-06 LAB — MAGNESIUM: Magnesium: 1.8 mg/dL (ref 1.7–2.4)

## 2021-11-06 MED ORDER — SACUBITRIL-VALSARTAN 24-26 MG PO TABS
ORAL_TABLET | ORAL | 0 refills | Status: DC
  Filled 2021-11-06: qty 60, 30d supply, fill #0

## 2021-11-06 MED ORDER — POTASSIUM CHLORIDE CRYS ER 20 MEQ PO TBCR
40.0000 meq | EXTENDED_RELEASE_TABLET | Freq: Once | ORAL | Status: AC
  Administered 2021-11-06: 40 meq via ORAL
  Filled 2021-11-06: qty 2

## 2021-11-06 MED ORDER — POTASSIUM CHLORIDE CRYS ER 20 MEQ PO TBCR
40.0000 meq | EXTENDED_RELEASE_TABLET | Freq: Two times a day (BID) | ORAL | Status: DC
  Administered 2021-11-06 – 2021-11-07 (×4): 40 meq via ORAL
  Filled 2021-11-06 (×5): qty 2

## 2021-11-06 MED ORDER — AMIODARONE LOAD VIA INFUSION
150.0000 mg | Freq: Once | INTRAVENOUS | Status: AC
  Administered 2021-11-06: 150 mg via INTRAVENOUS
  Filled 2021-11-06: qty 83.34

## 2021-11-06 MED ORDER — MAGNESIUM SULFATE 2 GM/50ML IV SOLN
2.0000 g | Freq: Once | INTRAVENOUS | Status: AC
  Administered 2021-11-06: 2 g via INTRAVENOUS
  Filled 2021-11-06: qty 50

## 2021-11-06 MED ORDER — METOLAZONE 2.5 MG PO TABS
2.5000 mg | ORAL_TABLET | Freq: Once | ORAL | Status: AC
  Administered 2021-11-06: 2.5 mg via ORAL
  Filled 2021-11-06: qty 1

## 2021-11-06 NOTE — Progress Notes (Addendum)
Advanced Heart Failure Rounding Note   Subjective:    Remains on milrinone at 0.125. Off NE. Co-ox 63%.   Good diuresis yesterday. 6.2L in UOP. Net negative 4.2L. Wt down 9 lb. Still 7 lb above pre-op wt. No CVP set up.   SCr stable 0.66. K 3.7   Maintaining NSR. HR 80s. SBPs 90s-low 100s.  Feels better today. Breathing improved. No dyspnea. OOB sitting up eating breakfast.    Objective:   Weight Range:  Vital Signs:   Temp:  [98.4 F (36.9 C)-99.3 F (37.4 C)] 98.9 F (37.2 C) (11/11 0400) Pulse Rate:  [56-119] 77 (11/11 0600) Resp:  [11-26] 11 (11/11 0600) BP: (91-139)/(65-83) 108/78 (11/11 0600) SpO2:  [87 %-100 %] 91 % (11/11 0600) Weight:  [68.9 kg] 68.9 kg (11/11 0500) Last BM Date: 11/04/21  Weight change: Filed Weights   11/04/21 0500 11/05/21 0300 11/06/21 0500  Weight: 72.2 kg 73.4 kg 68.9 kg    Intake/Output:   Intake/Output Summary (Last 24 hours) at 11/06/2021 0731 Last data filed at 11/06/2021 1245 Gross per 24 hour  Intake 1962.35 ml  Output 5860 ml  Net -3897.65 ml    PHYSICAL EXAM: General:  Well appearing sitting up in chair. No respiratory difficulty HEENT: normal Neck: supple. + Rt IJ CVC. No JVD. Carotids 2+ bilat; no bruits. No lymphadenopathy or thyromegaly appreciated. Cor: PMI nondisplaced. Regular rate & rhythm. No rubs, gallops or murmurs. Sternotomy site ok  Lungs: decreased BS at the bases bilaterally  Abdomen: soft, nontender, nondistended. No hepatosplenomegaly. No bruits or masses. Good bowel sounds. Extremities: no cyanosis, clubbing, rash, edema + ted hoses  Neuro: alert & oriented x 3, cranial nerves grossly intact. moves all 4 extremities w/o difficulty. Affect pleasant.  Telemetry:   NSR 80s. No further AFL. Personally reviewed   Labs: Basic Metabolic Panel: Recent Labs  Lab 11/02/21 1900 11/03/21 0446 11/03/21 1700 11/04/21 0412 11/05/21 0418 11/06/21 0410  NA 138 134* 133* 133* 132* 133*  K 4.0 3.9 4.1  4.5 4.3 3.7  CL 106 106 99 99 101 97*  CO2 23 23 27 28 24 29   GLUCOSE 161* 106* 156* 91 91 88  BUN 17 10 9 8 10 8   CREATININE 0.80 0.68 0.76 0.75 0.56 0.66  CALCIUM 8.0* 8.1* 8.5* 8.4* 8.3* 8.4*  MG 3.0* 2.1 1.8  --  1.6* 1.8    Liver Function Tests: Recent Labs  Lab 10/30/21 1030  AST 30  ALT 18  ALKPHOS 62  BILITOT 0.7  PROT 5.7*  ALBUMIN 3.5   No results for input(s): LIPASE, AMYLASE in the last 168 hours. No results for input(s): AMMONIA in the last 168 hours.  CBC: Recent Labs  Lab 11/03/21 0446 11/03/21 1700 11/04/21 0412 11/05/21 0418 11/06/21 0410  WBC 10.6* 9.7 8.3 7.0 6.0  HGB 10.8* 11.0* 10.8* 9.7* 9.9*  HCT 32.7* 33.6* 33.1* 29.7* 30.0*  MCV 100.0 101.5* 101.5* 101.4* 99.7  PLT 139* 128* 126* 130* 171    Cardiac Enzymes: No results for input(s): CKTOTAL, CKMB, CKMBINDEX, TROPONINI in the last 168 hours.  BNP: BNP (last 3 results) Recent Labs    03/10/21 2253 03/13/21 0953 07/30/21 1504  BNP 2,348.9* 2,254.2* 677.2*    ProBNP (last 3 results) No results for input(s): PROBNP in the last 8760 hours.    Other results:  Imaging: No results found.   Medications:     Scheduled Medications:  acetaminophen  1,000 mg Oral Q6H   Or  acetaminophen (TYLENOL) oral liquid 160 mg/5 mL  1,000 mg Per Tube Q6H   aspirin EC  325 mg Oral Daily   Or   aspirin  324 mg Per Tube Daily   bisacodyl  10 mg Oral Daily   Or   bisacodyl  10 mg Rectal Daily   buPROPion  150 mg Oral BID   chlorhexidine  15 mL Mouth Rinse BID   Chlorhexidine Gluconate Cloth  6 each Topical Daily   digoxin  0.125 mg Oral Daily   docusate sodium  200 mg Oral Daily   enoxaparin (LOVENOX) injection  40 mg Subcutaneous QHS   furosemide  40 mg Intravenous BID   mouth rinse  15 mL Mouth Rinse q12n4p   midodrine  10 mg Oral TID WC   pantoprazole  40 mg Oral Daily   potassium chloride  40 mEq Oral Once   sodium chloride flush  10-40 mL Intracatheter Q12H   sodium chloride  flush  3 mL Intravenous Q12H    Infusions:  sodium chloride     sodium chloride     sodium chloride 20 mL/hr at 11/02/21 1250   amiodarone 30 mg/hr (11/06/21 0628)   lactated ringers     lactated ringers 10 mL/hr at 11/06/21 0628   magnesium sulfate bolus IVPB 2 g (11/06/21 0635)   milrinone 0.125 mcg/kg/min (11/06/21 0628)   phenylephrine (NEO-SYNEPHRINE) Adult infusion Stopped (11/05/21 0540)    PRN Medications: sodium chloride, morphine injection, ondansetron (ZOFRAN) IV, oxyCODONE, sodium chloride flush, sodium chloride flush, traMADol   Assessment/Plan   1. Severe aortic insufficiency  - s/p bioprosthetic AVR 11/7 - stable post-op.    2. Acute on chronic systolic HF due to NICM  - EF 35-36% severely dilated LV - co-ox 63% on milrinone 0.125. will continue until better diuresed - Weight still up 7 pounds but doesn't seem that edematous on exam - Continue IV lasix 40 bid today. Give one dose metolazone. Supp K  - Currently on midodrine for BP support  - titrate GDMT as BP tolerates - SGLT2i soon  - will await to see if EF recovers with AVR   3. Paroxysmal AFL, post-op - successful RAP on 11/19 - in NSR, HR 80s  - continue IV amio for now    Length of Stay: 4   Brittainy Delmer Islam  11/06/2021, 7:31 AM  Advanced Heart Failure Team Pager [phone] (M-F; 7a - 4p)  Please contact [facility] for night-coverage after hours (4p -7a ) and weekends on [email or web address]  Patient seen and examined with the above-signed Advanced Practice Provider and/or Housestaff. I personally reviewed laboratory data, imaging studies and relevant notes. I independently examined the patient and formulated the important aspects of the plan. I have edited the note to reflect any of my changes or salient points. I have personally discussed the plan with the patient and/or family.  Diuresing well. Co-ox stable on low-dose milrinone. Breathing better. Remains in NSR on IV amio   General:   Sitting in chair No resp difficulty HEENT: normal Neck: supple. RIJ introducer  Carotids 2+ bilat; no bruits. No lymphadenopathy or thryomegaly appreciated. Cor: PMI nondisplaced.  RIJ introducer Regular rate & rhythm. No rubs, gallops or murmurs. Lungs: decreased at bases Abdomen: soft, nontender, nondistended. No hepatosplenomegaly. No bruits or masses. Good bowel sounds. Extremities: no cyanosis, clubbing, rash, edema Neuro: alert & orientedx3, cranial nerves grossly intact. moves all 4 extremities w/o difficulty. Affect pleasant  Co-ox stable on milrinone. Diuresing well but  still overloaded. SCr stable. Continue milrinone, IV lasix and IV amio. Give one dose of metolazone. Hopefully can start weaning drips tomorrow if fully diuresed.   Arvilla Meres, MD  5:43 PM

## 2021-11-06 NOTE — Progress Notes (Signed)
4 Days Post-Op Procedure(s) (LRB): AORTIC VALVE REPLACEMENT (AVR) USING INSPIRIS VALVE SIZE (N/A) TRANSESOPHAGEAL ECHOCARDIOGRAM (TEE) (N/A) APPLICATION OF CELL SAVER REPAIR OF PATENT FORAMEN OVALE Subjective: No complaints. Ambulating well.  Objective: Vital signs in last 24 hours: Temp:  [98.4 F (36.9 C)-99.3 F (37.4 C)] 98.9 F (37.2 C) (11/11 0400) Pulse Rate:  [56-119] 77 (11/11 0600) Cardiac Rhythm: Normal sinus rhythm (11/11 0400) Resp:  [11-26] 11 (11/11 0600) BP: (91-139)/(65-83) 108/78 (11/11 0600) SpO2:  [87 %-100 %] 91 % (11/11 0600) Weight:  [68.9 kg] 68.9 kg (11/11 0500)  Hemodynamic parameters for last 24 hours:    Intake/Output from previous day: 11/10 0701 - 11/11 0700 In: 1962.4 [P.O.:1200; I.V.:662.2; IV Piggyback:100.1] Out: 6160 [Urine:6160] Intake/Output this shift: No intake/output data recorded.  General appearance: alert and cooperative Neurologic: intact Heart: regular rate and rhythm, S1, S2 normal, no murmur Lungs: clear to auscultation bilaterally Extremities: no edema Wound: incision healing well.  Lab Results: Recent Labs    11/05/21 0418 11/06/21 0410  WBC 7.0 6.0  HGB 9.7* 9.9*  HCT 29.7* 30.0*  PLT 130* 171   BMET:  Recent Labs    11/05/21 0418 11/06/21 0410  NA 132* 133*  K 4.3 3.7  CL 101 97*  CO2 24 29  GLUCOSE 91 88  BUN 10 8  CREATININE 0.56 0.66  CALCIUM 8.3* 8.4*    PT/INR: No results for input(s): LABPROT, INR in the last 72 hours. ABG    Component Value Date/Time   PHART 7.374 11/02/2021 1701   HCO3 25.5 11/02/2021 1701   TCO2 27 11/02/2021 1701   ACIDBASEDEF 2.0 11/02/2021 1530   O2SAT 63.4 11/06/2021 0410   CBG (last 3)  Recent Labs    11/03/21 2000 11/03/21 2320 11/04/21 0327  GLUCAP 96 99 76   CXR: bibasilar atelectasis and small effusions.  Assessment/Plan: S/P Procedure(s) (LRB): AORTIC VALVE REPLACEMENT (AVR) USING INSPIRIS VALVE SIZE (N/A) TRANSESOPHAGEAL ECHOCARDIOGRAM  (TEE) (N/A) APPLICATION OF CELL SAVER REPAIR OF PATENT FORAMEN OVALE  POD 4 Hemodynamically stable in sinus rhythm on IV amio. Milrinone at 0.125. Co-ox at 63.4 today. Will discuss stopping milrinone with DB.  Diuresed -4 L yesterday with decrease in wt by 10 lbs. Still 7 lbs over preop wt. She has some pleural effusion so would benefit from further diuresis.   Will switch amio to po when milrinone stopped so we can remove neck sleeve.  Continue IS, ambulation.  Will transfer to 4 E when off milrinone.   LOS: 4 days    Pamela Bryan 11/06/2021

## 2021-11-06 NOTE — Progress Notes (Signed)
Patient ID: Pamela Bryan, female   DOB: [birth year], 50 y.o.   MRN: [id number]  TCTS Evening Rounds:  Hemodynamically stable on milrinone 0.125.  Sinus on amio drip.  -1100 cc today. Diuresing well.  Pamela Bryan feels well.

## 2021-11-06 NOTE — Telephone Encounter (Signed)
Advanced Heart Failure Patient Advocate Encounter  Prior Authorization for Sherryll Burger has been  submitted and approved.    PA#  [id number] Effective dates: 11/06/21 through 11/06/22  Patients co-pay is $4 (bill primary insurance first, then medicaid as secondary)  Archer Asa, CPhT

## 2021-11-06 NOTE — Anesthesia Postprocedure Evaluation (Signed)
Anesthesia Post Note  Patient: Pamela Bryan  Procedure(s) Performed: AORTIC VALVE REPLACEMENT (AVR) USING INSPIRIS VALVE SIZE (Chest) TRANSESOPHAGEAL ECHOCARDIOGRAM (TEE) APPLICATION OF CELL SAVER REPAIR OF PATENT FORAMEN OVALE     Patient location during evaluation: SICU Anesthesia Type: General Level of consciousness: sedated Pain management: pain level controlled Vital Signs Assessment: post-procedure vital signs reviewed and stable Respiratory status: patient remains intubated per anesthesia plan Cardiovascular status: stable Postop Assessment: no apparent nausea or vomiting Anesthetic complications: no   No notable events documented.  Last Vitals:  Vitals:   11/06/21 0818 11/06/21 0900  BP:  100/80  Pulse:  76  Resp:  18  Temp: 37.1 C   SpO2:  94%    Last Pain:  Vitals:   11/06/21 0818  TempSrc: Oral  PainSc:                  Majesta Leichter

## 2021-11-07 DIAGNOSIS — I5023 Acute on chronic systolic (congestive) heart failure: Secondary | ICD-10-CM | POA: Diagnosis not present

## 2021-11-07 DIAGNOSIS — Z952 Presence of prosthetic heart valve: Secondary | ICD-10-CM | POA: Diagnosis not present

## 2021-11-07 LAB — BASIC METABOLIC PANEL
Anion gap: 8 (ref 5–15)
BUN: 13 mg/dL (ref 6–20)
CO2: 30 mmol/L (ref 22–32)
Calcium: 9 mg/dL (ref 8.9–10.3)
Chloride: 93 mmol/L — ABNORMAL LOW (ref 98–111)
Creatinine, Ser: 0.55 mg/dL (ref 0.44–1.00)
GFR, Estimated: >60 mL/min (ref >60)
Glucose, Bld: 100 mg/dL — ABNORMAL HIGH (ref 70–99)
Potassium: 3.9 mmol/L (ref 3.5–5.1)
Sodium: 131 mmol/L — ABNORMAL LOW (ref 135–145)

## 2021-11-07 LAB — COOXEMETRY PANEL
Carboxyhemoglobin: 0.9 % (ref 0.5–1.5)
Carboxyhemoglobin: 0.8 % (ref 0.5–1.5)
Carboxyhemoglobin: 0.8 % (ref 0.5–1.5)
Methemoglobin: 0.9 % (ref 0.0–1.5)
Methemoglobin: 0.8 % (ref 0.0–1.5)
Methemoglobin: 0.9 % (ref 0.0–1.5)
O2 Saturation: 70.5 %
O2 Saturation: 49.8 %
O2 Saturation: 54.7 %
Total hemoglobin: 11.6 g/dL — ABNORMAL LOW (ref 12.0–16.0)
Total hemoglobin: 10.6 g/dL — ABNORMAL LOW (ref 12.0–16.0)
Total hemoglobin: 11.4 g/dL — ABNORMAL LOW (ref 12.0–16.0)

## 2021-11-07 LAB — MAGNESIUM: Magnesium: 1.7 mg/dL (ref 1.7–2.4)

## 2021-11-07 MED ORDER — METOLAZONE 2.5 MG PO TABS
2.5000 mg | ORAL_TABLET | Freq: Once | ORAL | Status: AC
  Administered 2021-11-07: 2.5 mg via ORAL
  Filled 2021-11-07: qty 1

## 2021-11-07 MED ORDER — POTASSIUM CHLORIDE CRYS ER 20 MEQ PO TBCR
40.0000 meq | EXTENDED_RELEASE_TABLET | Freq: Once | ORAL | Status: AC
  Administered 2021-11-07: 40 meq via ORAL
  Filled 2021-11-07: qty 2

## 2021-11-07 MED ORDER — MAGNESIUM SULFATE 4 GM/100ML IV SOLN
4.0000 g | Freq: Once | INTRAVENOUS | Status: AC
  Administered 2021-11-07: 4 g via INTRAVENOUS
  Filled 2021-11-07: qty 100

## 2021-11-07 NOTE — Progress Notes (Signed)
Patient ID: Pamela Bryan, female   DOB: October 31, 1971, 49 y.o.   MRN: [id number] TCTS Evening Rounds:  Hemodynamically stable in sinus rhythm on milrinone 0.125. Co-ox was 70.5 this am and 50, 55 this afternoon. Who knows what's accurate. She feels fine.  -1360 cc so far today.

## 2021-11-07 NOTE — Progress Notes (Signed)
0700 - Report received from PM RN.  All questions answered.  Safety checks performed. All lines and drips verified. Hand hygiene before/after each pt contact. 0800 - Assessment. 0900 - Dr. Gala Romney rounded on pt. 0915 - Dr. Laneta Simmers notified. 1300 - Pt walked four laps around the unit without difficulty, distress, or assistance. 1652 - Coox drawn.  Dr. Gala Romney notified. 1720 - Coox redrawn d/t drop in numbers.  Dr. Gala Romney and Dr. Laneta Simmers notified.  1720 - Dr. Laneta Simmers rounded on pt.  1830 - Dr. Gala Romney advised to D/C Milrinone and introducer. 1845 - Milrinone and introducer D/C'd. 1900 - Report given to PM RN.  All questions answered.

## 2021-11-07 NOTE — Progress Notes (Signed)
5 Days Post-Op Procedure(s) (LRB): AORTIC VALVE REPLACEMENT (AVR) USING INSPIRIS VALVE SIZE (N/A) TRANSESOPHAGEAL ECHOCARDIOGRAM (TEE) (N/A) APPLICATION OF CELL SAVER REPAIR OF PATENT FORAMEN OVALE Subjective: No complaints. Feels well, ambulating well.  Co-ox 70.5 on milrinone 0.125  -3L yesterday. Wt is 4 lbs over preop today.  Objective: Vital signs in last 24 hours: Temp:  [98.3 F (36.8 C)-98.8 F (37.1 C)] 98.8 F (37.1 C) (11/12 0700) Pulse Rate:  [53-90] 82 (11/12 0800) Cardiac Rhythm: Normal sinus rhythm (11/11 2000) Resp:  [13-21] 20 (11/12 0800) BP: (83-111)/(64-85) 92/69 (11/12 0800) SpO2:  [90 %-98 %] 93 % (11/12 0800) Weight:  [67.8 kg] 67.8 kg (11/12 0500)  Hemodynamic parameters for last 24 hours:    Intake/Output from previous day: 11/11 0701 - 11/12 0700 In: 1312.7 [P.O.:480; I.V.:782.7; IV Piggyback:50] Out: 4250 [Urine:4250] Intake/Output this shift: Total I/O In: 120 [P.O.:120] Out: -   General appearance: alert and cooperative Neurologic: intact Heart: regular rate and rhythm, S1, S2 normal, no murmur Lungs: clear to auscultation bilaterally Extremities: no edema Wound: incision healing well.  Lab Results: Recent Labs    11/05/21 0418 11/06/21 0410  WBC 7.0 6.0  HGB 9.7* 9.9*  HCT 29.7* 30.0*  PLT 130* 171   BMET:  Recent Labs    11/06/21 0410 11/07/21 0418  NA 133* 131*  K 3.7 3.9  CL 97* 93*  CO2 29 30  GLUCOSE 88 100*  BUN 8 13  CREATININE 0.66 0.55  CALCIUM 8.4* 9.0    PT/INR: No results for input(s): LABPROT, INR in the last 72 hours. ABG    Component Value Date/Time   PHART 7.374 11/02/2021 1701   HCO3 25.5 11/02/2021 1701   TCO2 27 11/02/2021 1701   ACIDBASEDEF 2.0 11/02/2021 1530   O2SAT 70.5 11/07/2021 0418   CBG (last 3)  No results for input(s): GLUCAP in the last 72 hours.  Assessment/Plan: S/P Procedure(s) (LRB): AORTIC VALVE REPLACEMENT (AVR) USING INSPIRIS VALVE SIZE  (N/A) TRANSESOPHAGEAL ECHOCARDIOGRAM (TEE) (N/A) APPLICATION OF CELL SAVER REPAIR OF PATENT FORAMEN OVALE  POD 5 Hemodynamically stable in sinus rhythm on IV amio. Brief atrial fib yesterday.  Discussed with DB and will plan further diuresis today and then discontinue milrinone and switch amio to po.   Continue IS, ambulation   LOS: 5 days    Alleen Borne 11/07/2021

## 2021-11-07 NOTE — Progress Notes (Signed)
Advanced Heart Failure Rounding Note   Subjective:    Remains on milrinone at 0.125. Co-ox 71%  Diuresing well. Weight still up 4 pounds over pre-op. Had recurrent AF/AFL last night. No win sinus on IV amio.   Wants introducer out. No CP, SOB, orthopnea.   Objective:   Weight Range:  Vital Signs:   Temp:  [98.3 F (36.8 C)-98.8 F (37.1 C)] 98.8 F (37.1 C) (11/12 0700) Pulse Rate:  [53-90] 82 (11/12 0800) Resp:  [13-21] 20 (11/12 0800) BP: (83-111)/(64-85) 92/69 (11/12 0800) SpO2:  [90 %-98 %] 93 % (11/12 0800) Weight:  [67.8 kg] 67.8 kg (11/12 0500) Last BM Date: 11/04/21  Weight change: Filed Weights   11/05/21 0300 11/06/21 0500 11/07/21 0500  Weight: 73.4 kg 68.9 kg 67.8 kg    Intake/Output:   Intake/Output Summary (Last 24 hours) at 11/07/2021 0952 Last data filed at 11/07/2021 0809 Gross per 24 hour  Intake 838.87 ml  Output 3200 ml  Net -2361.13 ml     PHYSICAL EXAM: General:  Sitting up in chair. No resp difficulty HEENT: normal Neck: supple. RIJ introducer. Carotids 2+ bilat; no bruits. No lymphadenopathy or thryomegaly appreciated. URK:YHCWCBJ site ok  Regular rate & rhythm. No rubs, gallops or murmurs. Lungs: clear Abdomen: soft, nontender, nondistended. No hepatosplenomegaly. No bruits or masses. Good bowel sounds. Extremities: no cyanosis, clubbing, rash, edema Neuro: alert & orientedx3, cranial nerves grossly intact. moves all 4 extremities w/o difficulty. Affect pleasant  Telemetry:   NSR 80s Personally reviewed   Labs: Basic Metabolic Panel: Recent Labs  Lab 11/03/21 0446 11/03/21 1700 11/04/21 0412 11/05/21 0418 11/06/21 0410 11/07/21 0418  NA 134* 133* 133* 132* 133* 131*  K 3.9 4.1 4.5 4.3 3.7 3.9  CL 106 99 99 101 97* 93*  CO2 23 27 28 24 29 30   GLUCOSE 106* 156* 91 91 88 100*  BUN 10 9 8 10 8 13   CREATININE 0.68 0.76 0.75 0.56 0.66 0.55  CALCIUM 8.1* 8.5* 8.4* 8.3* 8.4* 9.0  MG 2.1 1.8  --  1.6* 1.8 1.7     Liver  Function Tests: No results for input(s): AST, ALT, ALKPHOS, BILITOT, PROT, ALBUMIN in the last 168 hours.  No results for input(s): LIPASE, AMYLASE in the last 168 hours. No results for input(s): AMMONIA in the last 168 hours.  CBC: Recent Labs  Lab 11/03/21 0446 11/03/21 1700 11/04/21 0412 11/05/21 0418 11/06/21 0410  WBC 10.6* 9.7 8.3 7.0 6.0  HGB 10.8* 11.0* 10.8* 9.7* 9.9*  HCT 32.7* 33.6* 33.1* 29.7* 30.0*  MCV 100.0 101.5* 101.5* 101.4* 99.7  PLT 139* 128* 126* 130* 171     Cardiac Enzymes: No results for input(s): CKTOTAL, CKMB, CKMBINDEX, TROPONINI in the last 168 hours.  BNP: BNP (last 3 results) Recent Labs    03/10/21 2253 03/13/21 0953 07/30/21 1504  BNP 2,348.9* 2,254.2* 677.2*     ProBNP (last 3 results) No results for input(s): PROBNP in the last 8760 hours.    Other results:  Imaging: DG CHEST PORT 1 VIEW  Result Date: 11/06/2021 CLINICAL DATA:  Chest tube removed EXAM: PORTABLE CHEST 1 VIEW COMPARISON:  Prior chest x-ray 11/04/2021 FINDINGS: Patient is status post median sternotomy for aortic valve replacement. Right IJ Cordis sheath remains in place. No evidence of pneumothorax. Persistent left basilar airspace opacity which may reflect atelectasis or pleural effusion. Lungs are otherwise clear. Inspiratory volumes remain low. No pneumothorax. IMPRESSION: 1. No evidence of pneumothorax. 2. Persistent left basilar  opacity which may reflect pleural fluid or atelectasis. 3. Mild right basilar atelectasis. Electronically Signed   By: Jacqulynn Cadet M.D.   On: 11/06/2021 08:08     Medications:     Scheduled Medications:  acetaminophen  1,000 mg Oral Q6H   Or   acetaminophen (TYLENOL) oral liquid 160 mg/5 mL  1,000 mg Per Tube Q6H   aspirin EC  325 mg Oral Daily   Or   aspirin  324 mg Per Tube Daily   buPROPion  150 mg Oral BID   chlorhexidine  15 mL Mouth Rinse BID   Chlorhexidine Gluconate Cloth  6 each Topical Daily   digoxin  0.125 mg  Oral Daily   docusate sodium  200 mg Oral Daily   enoxaparin (LOVENOX) injection  40 mg Subcutaneous QHS   furosemide  40 mg Intravenous BID   mouth rinse  15 mL Mouth Rinse q12n4p   midodrine  10 mg Oral TID WC   pantoprazole  40 mg Oral Daily   potassium chloride  40 mEq Oral BID   sodium chloride flush  10-40 mL Intracatheter Q12H   sodium chloride flush  3 mL Intravenous Q12H    Infusions:  sodium chloride     sodium chloride     sodium chloride 20 mL/hr at 11/02/21 1250   amiodarone 30 mg/hr (11/07/21 0700)   lactated ringers     lactated ringers 10 mL/hr at 11/07/21 0700   milrinone 0.125 mcg/kg/min (11/07/21 0700)    PRN Medications: sodium chloride, morphine injection, ondansetron (ZOFRAN) IV, oxyCODONE, sodium chloride flush, sodium chloride flush, traMADol   Assessment/Plan   1. Severe aortic insufficiency  - s/p bioprosthetic AVR 11/7 - stable post-op   2. Acute on chronic systolic HF due to NICM  - EF 10-15% severely dilated LV - co-ox 71% on milrinone 0.125 - Weight still up 4 pounds - Give one more dose IV lasix today and metolazone. Add SGLT2i tomorrow. Supp K - Can likely stop milrinone later today and pull introducer - Currently on midodrine to tid for BP support  - titrate GDMT as BP tolerates - SGLT2i soon  - will await to see if EF recovers with AVR. Suspect she may need advanced therapies   3. Paroxysmal AFL, post-op - successful RAP on 11/19 - recurrent AFL/AF last night - continue IV amio one more day  D/w Dr. Cyndia Bent at bedside.    Length of Stay: 5   Glori Bickers MD 11/07/2021, 9:52 AM  Advanced Heart Failure Team Pager [phone] (M-F; 7a - 4p)  Please contact [facility] for night-coverage after hours (4p -7a ) and weekends on [email or web address]

## 2021-11-08 DIAGNOSIS — Z952 Presence of prosthetic heart valve: Secondary | ICD-10-CM | POA: Diagnosis not present

## 2021-11-08 DIAGNOSIS — I5023 Acute on chronic systolic (congestive) heart failure: Secondary | ICD-10-CM | POA: Diagnosis not present

## 2021-11-08 DIAGNOSIS — I4892 Unspecified atrial flutter: Secondary | ICD-10-CM | POA: Diagnosis not present

## 2021-11-08 LAB — BASIC METABOLIC PANEL
Anion gap: 13 (ref 5–15)
BUN: 12 mg/dL (ref 6–20)
CO2: 27 mmol/L (ref 22–32)
Calcium: 9.4 mg/dL (ref 8.9–10.3)
Chloride: 90 mmol/L — ABNORMAL LOW (ref 98–111)
Creatinine, Ser: 0.79 mg/dL (ref 0.44–1.00)
GFR, Estimated: >60 mL/min (ref >60)
Glucose, Bld: 175 mg/dL — ABNORMAL HIGH (ref 70–99)
Potassium: 3.7 mmol/L (ref 3.5–5.1)
Sodium: 130 mmol/L — ABNORMAL LOW (ref 135–145)

## 2021-11-08 LAB — MAGNESIUM: Magnesium: 1.7 mg/dL (ref 1.7–2.4)

## 2021-11-08 LAB — COOXEMETRY PANEL
Carboxyhemoglobin: 0.5 % (ref 0.5–1.5)
Methemoglobin: 0.8 % (ref 0.0–1.5)
O2 Saturation: 12.6 %
Total hemoglobin: 11.7 g/dL — ABNORMAL LOW (ref 12.0–16.0)

## 2021-11-08 MED ORDER — ASPIRIN EC 325 MG PO TBEC: 325.0000 mg | DELAYED_RELEASE_TABLET | Freq: Every day | ORAL | Status: DC

## 2021-11-08 MED ORDER — SENNOSIDES-DOCUSATE SODIUM 8.6-50 MG PO TABS: 1.0000 | ORAL_TABLET | Freq: Two times a day (BID) | ORAL | Status: DC | PRN

## 2021-11-08 MED ORDER — POLYVINYL ALCOHOL 1.4 % OP SOLN
2.0000 [drp] | OPHTHALMIC | Status: DC | PRN
  Administered 2021-11-08: 2 [drp] via OPHTHALMIC
  Filled 2021-11-08: qty 15

## 2021-11-08 MED ORDER — AMIODARONE IV BOLUS ONLY 150 MG/100ML
150.0000 mg | Freq: Once | INTRAVENOUS | Status: AC
  Administered 2021-11-08: 150 mg via INTRAVENOUS
  Filled 2021-11-08: qty 100

## 2021-11-08 MED ORDER — ~~LOC~~ CARDIAC SURGERY, PATIENT & FAMILY EDUCATION: Freq: Once | Status: AC

## 2021-11-08 MED ORDER — SODIUM CHLORIDE 0.9 % IV SOLN: 250.0000 mL | INTRAVENOUS | Status: DC | PRN

## 2021-11-08 MED ORDER — SODIUM CHLORIDE 0.9% FLUSH
3.0000 mL | Freq: Two times a day (BID) | INTRAVENOUS | Status: DC
  Administered 2021-11-08 – 2021-11-10 (×5): 3 mL via INTRAVENOUS

## 2021-11-08 MED ORDER — TEMAZEPAM 15 MG PO CAPS
15.0000 mg | ORAL_CAPSULE | Freq: Every evening | ORAL | Status: DC | PRN
  Administered 2021-11-08 – 2021-11-10 (×3): 15 mg via ORAL
  Filled 2021-11-08 (×3): qty 1

## 2021-11-08 MED ORDER — CHLORHEXIDINE GLUCONATE CLOTH 2 % EX PADS
6.0000 | MEDICATED_PAD | Freq: Every day | CUTANEOUS | Status: DC
  Administered 2021-11-08 – 2021-11-10 (×3): 6 via TOPICAL

## 2021-11-08 MED ORDER — AMIODARONE HCL 200 MG PO TABS
200.0000 mg | ORAL_TABLET | Freq: Two times a day (BID) | ORAL | Status: DC
  Administered 2021-11-08 – 2021-11-09 (×4): 200 mg via ORAL
  Filled 2021-11-08 (×4): qty 1

## 2021-11-08 MED ORDER — SODIUM CHLORIDE 0.9% FLUSH: 3.0000 mL | INTRAVENOUS | Status: DC | PRN

## 2021-11-08 MED ORDER — MAGNESIUM SULFATE 4 GM/100ML IV SOLN
4.0000 g | Freq: Once | INTRAVENOUS | Status: AC
  Administered 2021-11-08: 4 g via INTRAVENOUS
  Filled 2021-11-08: qty 100

## 2021-11-08 MED ORDER — POTASSIUM CHLORIDE CRYS ER 20 MEQ PO TBCR
40.0000 meq | EXTENDED_RELEASE_TABLET | Freq: Two times a day (BID) | ORAL | Status: AC
  Administered 2021-11-08 (×2): 40 meq via ORAL
  Filled 2021-11-08: qty 2

## 2021-11-08 NOTE — Progress Notes (Signed)
Advanced Heart Failure Rounding Note   Subjective:    Milrinone stopped and introducer removed yesterday.   Excellent diuresis but weight unchanged.   Feels good. Denies SOB, orthopnea or PND. On midodrine to maintain BP.   Objective:   Weight Range:  Vital Signs:   Temp:  [98.2 F (36.8 C)-99.1 F (37.3 C)] 98.9 F (37.2 C) (11/13 0716) Pulse Rate:  [42-110] 42 (11/13 0900) Resp:  [15-24] 23 (11/13 0900) BP: (88-130)/(61-82) 88/61 (11/13 0800) SpO2:  [91 %-97 %] 96 % (11/13 0900) Weight:  [67.3 kg-68 kg] 67.3 kg (11/13 0900) Last BM Date: 11/04/21  Weight change: Filed Weights   11/07/21 0500 11/08/21 0500 11/08/21 0900  Weight: 67.8 kg 68 kg 67.3 kg    Intake/Output:   Intake/Output Summary (Last 24 hours) at 11/08/2021 0956 Last data filed at 11/08/2021 0900 Gross per 24 hour  Intake 1195.45 ml  Output 5300 ml  Net -4104.55 ml     PHYSICAL EXAM: General:  Well appearing. No resp difficulty HEENT: normal Neck: supple. no JVD. Carotids 2+ bilat; no bruits. No lymphadenopathy or thryomegaly appreciated. Cor: Laterally displaced. Regular rate & rhythm. No rubs, gallops or murmurs. Lungs: clear Abdomen: soft, nontender, nondistended. No hepatosplenomegaly. No bruits or masses. Good bowel sounds. Extremities: no cyanosis, clubbing, rash, edema Neuro: alert & orientedx3, cranial nerves grossly intact. moves all 4 extremities w/o difficulty. Affect pleasant   Telemetry:   NSR 70-80s Personally reviewed   Labs: Basic Metabolic Panel: Recent Labs  Lab 11/03/21 0446 11/03/21 1700 11/04/21 0412 11/05/21 0418 11/06/21 0410 11/07/21 0418  NA 134* 133* 133* 132* 133* 131*  K 3.9 4.1 4.5 4.3 3.7 3.9  CL 106 99 99 101 97* 93*  CO2 23 27 28 24 29 30   GLUCOSE 106* 156* 91 91 88 100*  BUN 10 9 8 10 8 13   CREATININE 0.68 0.76 0.75 0.56 0.66 0.55  CALCIUM 8.1* 8.5* 8.4* 8.3* 8.4* 9.0  MG 2.1 1.8  --  1.6* 1.8 1.7     Liver Function Tests: No results  for input(s): AST, ALT, ALKPHOS, BILITOT, PROT, ALBUMIN in the last 168 hours.  No results for input(s): LIPASE, AMYLASE in the last 168 hours. No results for input(s): AMMONIA in the last 168 hours.  CBC: Recent Labs  Lab 11/03/21 0446 11/03/21 1700 11/04/21 0412 11/05/21 0418 11/06/21 0410  WBC 10.6* 9.7 8.3 7.0 6.0  HGB 10.8* 11.0* 10.8* 9.7* 9.9*  HCT 32.7* 33.6* 33.1* 29.7* 30.0*  MCV 100.0 101.5* 101.5* 101.4* 99.7  PLT 139* 128* 126* 130* 171     Cardiac Enzymes: No results for input(s): CKTOTAL, CKMB, CKMBINDEX, TROPONINI in the last 168 hours.  BNP: BNP (last 3 results) Recent Labs    03/10/21 2253 03/13/21 0953 07/30/21 1504  BNP 2,348.9* 2,254.2* 677.2*     ProBNP (last 3 results) No results for input(s): PROBNP in the last 8760 hours.    Other results:  Imaging: No results found.   Medications:     Scheduled Medications:  amiodarone  200 mg Oral BID   aspirin EC  325 mg Oral Daily   Or   aspirin  324 mg Per Tube Daily   buPROPion  150 mg Oral BID   chlorhexidine  15 mL Mouth Rinse BID   Chlorhexidine Gluconate Cloth  6 each Topical Daily   digoxin  0.125 mg Oral Daily   docusate sodium  200 mg Oral Daily   enoxaparin (LOVENOX) injection  40  mg Subcutaneous QHS   midodrine  10 mg Oral TID WC   pantoprazole  40 mg Oral Daily   sodium chloride flush  10-40 mL Intracatheter Q12H   sodium chloride flush  3 mL Intravenous Q12H    Infusions:  sodium chloride     sodium chloride     sodium chloride 20 mL/hr at 11/02/21 1250   lactated ringers     lactated ringers Stopped (11/07/21 1854)    PRN Medications: sodium chloride, morphine injection, ondansetron (ZOFRAN) IV, oxyCODONE, sodium chloride flush, sodium chloride flush, traMADol   Assessment/Plan   1. Severe aortic insufficiency  - s/p bioprosthetic AVR 11/7 - stable post-op. Ambualting   2. Acute on chronic systolic HF due to NICM  - EF 60-10% severely dilated LV -  Milrinone stopped 11/12 - Currently on midodrine 10 tid for BP support  - Voume status ok. Possibly a bit low. Stop diuretics. - titrate GDMT as BP tolerates. Consider SGLT2i prior to d/c - will await to see if EF recovers with AVR. Suspect she may need advanced therapies   3. Paroxysmal AFL, post-op - successful RAP on 11/19 - Quiescent - switch amio to 200 po bid  D/w Dr. Laneta Simmers at bedside. Can go to 4E   Length of Stay: 6   Arvilla Meres MD 11/08/2021, 9:56 AM  Advanced Heart Failure Team Pager [phone] (M-F; 7a - 4p)  Please contact [facility] for night-coverage after hours (4p -7a ) and weekends on [email or web address]

## 2021-11-08 NOTE — Progress Notes (Signed)
6 Days Post-Op Procedure(s) (LRB): AORTIC VALVE REPLACEMENT (AVR) USING INSPIRIS VALVE SIZE (N/A) TRANSESOPHAGEAL ECHOCARDIOGRAM (TEE) (N/A) APPLICATION OF CELL SAVER REPAIR OF PATENT FORAMEN OVALE Subjective: No complaints. Ambulating well.  Objective: Vital signs in last 24 hours: Temp:  [98.2 F (36.8 C)-99.1 F (37.3 C)] 98.9 F (37.2 C) (11/13 0716) Pulse Rate:  [42-110] 42 (11/13 0900) Cardiac Rhythm: Normal sinus rhythm (11/13 0800) Resp:  [15-24] 23 (11/13 0900) BP: (88-130)/(61-82) 88/61 (11/13 0800) SpO2:  [91 %-97 %] 96 % (11/13 0900) Weight:  [67.3 kg-68 kg] 67.3 kg (11/13 0900)  Hemodynamic parameters for last 24 hours:    Intake/Output from previous day: 11/12 0701 - 11/13 0700 In: 1340.5 [P.O.:720; I.V.:520.5; IV Piggyback:99.9] Out: 5150 [Urine:5150] Intake/Output this shift: Total I/O In: 273.3 [P.O.:240; I.V.:33.3] Out: -   General appearance: alert and cooperative Neurologic: intact Heart: regular rate and rhythm, S1, S2 normal, no murmur Lungs: clear to auscultation bilaterally Extremities: no edema Wound: incision healing well.  Lab Results: Recent Labs    11/06/21 0410  WBC 6.0  HGB 9.9*  HCT 30.0*  PLT 171   BMET:  Recent Labs    11/06/21 0410 11/07/21 0418  NA 133* 131*  K 3.7 3.9  CL 97* 93*  CO2 29 30  GLUCOSE 88 100*  BUN 8 13  CREATININE 0.66 0.55  CALCIUM 8.4* 9.0    PT/INR: No results for input(s): LABPROT, INR in the last 72 hours. ABG    Component Value Date/Time   PHART 7.374 11/02/2021 1701   HCO3 25.5 11/02/2021 1701   TCO2 27 11/02/2021 1701   ACIDBASEDEF 2.0 11/02/2021 1530   O2SAT 12.6 11/08/2021 0113   CBG (last 3)  No results for input(s): GLUCAP in the last 72 hours.  Assessment/Plan: S/P Procedure(s) (LRB): AORTIC VALVE REPLACEMENT (AVR) USING INSPIRIS VALVE SIZE (N/A) TRANSESOPHAGEAL ECHOCARDIOGRAM (TEE) (N/A) APPLICATION OF CELL SAVER REPAIR OF PATENT FORAMEN OVALE  POD  6  Hemodynamically stable with borderline BP on midodrine.   She was - 4L yesterday and -10 L since admit. Wt unchanged but I think she is dried out as much as possible. Will stop lasix.  Maintaining sinus rhythm. Switch amio to po.  DC pacing wires.  Transfer to 4E and continue mobilization.   LOS: 6 days    Alleen Borne 11/08/2021

## 2021-11-08 NOTE — Progress Notes (Signed)
0700 - Report received from PM RN.  All questions answered.  Safety checks performed.  All lines and drips verified. Hand hygiene performed before/after each pt contact. 0800 - Assessment. 0830 - Dr. Laneta Simmers rounded on pt. 0900 - Dr. Gala Romney rounded on pt. 0930 - Pt's pacer wires were pulled per order.  No complications, difficulty, or distress noted.  Pacer wires intact. 1000 - Pt bathed. 1030 - Pt became tachycardic, pt in a-flutter.  Dr. Gala Romney and Dr. Laneta Simmers notified.  Dr. Gala Romney ordered an Amio bolus. 1200 - Pt converted back to NSR. 1500 - Report given to Natalia Leatherwood, RN and Rolly Salter, Charity fundraiser.  All questions answered.

## 2021-11-08 NOTE — Progress Notes (Signed)
Patient ID: Pamela Bryan, female   DOB: Oct 31, 1971, 50 y.o.   MRN: [id number] TCTS Evening Rounds:  Hemodynamically stable Has had recurrent atrial fib/flutter today with rate 120-130's. Amio switched to po. She received a bolus. She feels fine otherwise. Transfer held to be sure rate and rhythm under control.

## 2021-11-09 ENCOUNTER — Other Ambulatory Visit (HOSPITAL_COMMUNITY): Payer: Self-pay

## 2021-11-09 DIAGNOSIS — I4892 Unspecified atrial flutter: Secondary | ICD-10-CM | POA: Diagnosis not present

## 2021-11-09 DIAGNOSIS — I5023 Acute on chronic systolic (congestive) heart failure: Secondary | ICD-10-CM | POA: Diagnosis not present

## 2021-11-09 DIAGNOSIS — Z952 Presence of prosthetic heart valve: Secondary | ICD-10-CM | POA: Diagnosis not present

## 2021-11-09 LAB — BASIC METABOLIC PANEL
Anion gap: 11 (ref 5–15)
BUN: 12 mg/dL (ref 6–20)
CO2: 25 mmol/L (ref 22–32)
Calcium: 9.2 mg/dL (ref 8.9–10.3)
Chloride: 97 mmol/L — ABNORMAL LOW (ref 98–111)
Creatinine, Ser: 0.69 mg/dL (ref 0.44–1.00)
GFR, Estimated: >60 mL/min (ref >60)
Glucose, Bld: 96 mg/dL (ref 70–99)
Potassium: 4.2 mmol/L (ref 3.5–5.1)
Sodium: 133 mmol/L — ABNORMAL LOW (ref 135–145)

## 2021-11-09 LAB — MAGNESIUM: Magnesium: 2.2 mg/dL (ref 1.7–2.4)

## 2021-11-09 MED ORDER — DOCUSATE SODIUM 100 MG PO CAPS: 200.0000 mg | ORAL_CAPSULE | Freq: Every day | ORAL | Status: DC

## 2021-11-09 MED ORDER — SENNOSIDES-DOCUSATE SODIUM 8.6-50 MG PO TABS: 1.0000 | ORAL_TABLET | Freq: Two times a day (BID) | ORAL | Status: DC | PRN

## 2021-11-09 MED ORDER — POLYETHYLENE GLYCOL 3350 17 G PO PACK: 17.0000 g | PACK | Freq: Every day | ORAL | Status: DC

## 2021-11-09 MED ORDER — SENNOSIDES-DOCUSATE SODIUM 8.6-50 MG PO TABS: 1.0000 | ORAL_TABLET | Freq: Two times a day (BID) | ORAL | Status: DC

## 2021-11-09 MED ORDER — APIXABAN 5 MG PO TABS
5.0000 mg | ORAL_TABLET | Freq: Two times a day (BID) | ORAL | Status: DC
  Administered 2021-11-09 – 2021-11-11 (×5): 5 mg via ORAL
  Filled 2021-11-09 (×5): qty 1

## 2021-11-09 MED ORDER — ASPIRIN EC 81 MG PO TBEC
81.0000 mg | DELAYED_RELEASE_TABLET | Freq: Every day | ORAL | Status: DC
  Administered 2021-11-09 – 2021-11-11 (×3): 81 mg via ORAL
  Filled 2021-11-09 (×3): qty 1

## 2021-11-09 NOTE — Progress Notes (Signed)
Pt is independent in room and hall. Feels well. Discussed IS, sternal precautions, exercise, low sodium diet, daily wts, and CRPII. Pt very receptive. Will refer to [place] CRPII.  [id number] Ethelda Chick CES, ACSM 3:28 PM 11/09/2021

## 2021-11-09 NOTE — Progress Notes (Addendum)
Advanced Heart Failure Rounding Note   Subjective:    Milrinone stopped 11/12. No central access currently for co-ox monitoring.   Diuretics held yesterday due to low volume status. Wt continues to trend down, down additional 3 lb and back to pre-op wt.   Went back into Afib w/ RVR yesterday. Bolused w/ amio and converted to NSR. Maintaining NSR.  SBPs remain soft, low 100s.   EPW pulled yesterday.   Feels well today. Slept better last night. Denies CP. No dyspnea.     Objective:   Weight Range:  Vital Signs:   Temp:  [98.4 F (36.9 C)-99.2 F (37.3 C)] 98.8 F (37.1 C) (11/14 0500) Pulse Rate:  [43-116] 116 (11/14 0600) Resp:  [16-39] 39 (11/14 0600) BP: (88-113)/(60-85) 100/84 (11/14 0600) SpO2:  [86 %-100 %] 98 % (11/14 0600) Weight:  [66 kg-67.3 kg] 66 kg (11/14 0500) Last BM Date: 11/04/21  Weight change: Filed Weights   11/08/21 0500 11/08/21 0900 11/09/21 0500  Weight: 68 kg 67.3 kg 66 kg    Intake/Output:   Intake/Output Summary (Last 24 hours) at 11/09/2021 0734 Last data filed at 11/08/2021 2100 Gross per 24 hour  Intake 727.65 ml  Output 2800 ml  Net -2072.35 ml     PHYSICAL EXAM: General:  Well appearing. No respiratory difficulty HEENT: normal Neck: supple. no JVD. Carotids 2+ bilat; no bruits. No lymphadenopathy or thyromegaly appreciated. Cor: PMI nondisplaced. Regular rate & rhythm. No rubs, gallops or murmurs. Sternal site ok.  Lungs: clear Abdomen: soft, nontender, nondistended. No hepatosplenomegaly. No bruits or masses. Good bowel sounds. Extremities: no cyanosis, clubbing, rash, edema Neuro: alert & oriented x 3, cranial nerves grossly intact. moves all 4 extremities w/o difficulty. Affect pleasant.    Telemetry:  NSR 80s. No Afib overnight. Personally reviewed   Labs: Basic Metabolic Panel: Recent Labs  Lab 11/03/21 1700 11/04/21 0412 11/05/21 0418 11/06/21 0410 11/07/21 0418 11/08/21 1007 11/09/21 0056  NA 133*    < > 132* 133* 131* 130* 133*  K 4.1   < > 4.3 3.7 3.9 3.7 4.2  CL 99   < > 101 97* 93* 90* 97*  CO2 27   < > 24 29 30 27 25   GLUCOSE 156*   < > 91 88 100* 175* 96  BUN 9   < > 10 8 13 12 12   CREATININE 0.76   < > 0.56 0.66 0.55 0.79 0.69  CALCIUM 8.5*   < > 8.3* 8.4* 9.0 9.4 9.2  MG 1.8  --  1.6* 1.8 1.7 1.7  --    < > = values in this interval not displayed.    Liver Function Tests: No results for input(s): AST, ALT, ALKPHOS, BILITOT, PROT, ALBUMIN in the last 168 hours.  No results for input(s): LIPASE, AMYLASE in the last 168 hours. No results for input(s): AMMONIA in the last 168 hours.  CBC: Recent Labs  Lab 11/03/21 0446 11/03/21 1700 11/04/21 0412 11/05/21 0418 11/06/21 0410  WBC 10.6* 9.7 8.3 7.0 6.0  HGB 10.8* 11.0* 10.8* 9.7* 9.9*  HCT 32.7* 33.6* 33.1* 29.7* 30.0*  MCV 100.0 101.5* 101.5* 101.4* 99.7  PLT 139* 128* 126* 130* 171    Cardiac Enzymes: No results for input(s): CKTOTAL, CKMB, CKMBINDEX, TROPONINI in the last 168 hours.  BNP: BNP (last 3 results) Recent Labs    03/10/21 2253 03/13/21 0953 07/30/21 1504  BNP 2,348.9* 2,254.2* 677.2*    ProBNP (last 3 results) No results for  input(s): PROBNP in the last 8760 hours.    Other results:  Imaging: No results found.   Medications:     Scheduled Medications:  amiodarone  200 mg Oral BID   aspirin EC  325 mg Oral Daily   buPROPion  150 mg Oral BID   chlorhexidine  15 mL Mouth Rinse BID   Chlorhexidine Gluconate Cloth  6 each Topical Daily   digoxin  0.125 mg Oral Daily   enoxaparin (LOVENOX) injection  40 mg Subcutaneous QHS   midodrine  10 mg Oral TID WC   pantoprazole  40 mg Oral Daily   sodium chloride flush  3 mL Intravenous Q12H    Infusions:  sodium chloride      PRN Medications: sodium chloride, ondansetron (ZOFRAN) IV, oxyCODONE, polyvinyl alcohol, senna-docusate, sodium chloride flush, temazepam, traMADol   Assessment/Plan   1. Severe aortic insufficiency  - s/p  bioprosthetic AVR 11/7 - stable post-op. Ambualting   2. Acute on chronic systolic HF due to NICM  - EF 62-03% severely dilated LV - Milrinone stopped 11/12 - Currently on midodrine 10 tid for BP support  - Voume status ok. Possibly a bit low. Back to pre--op wt. Hold diuretics again today  - titrate GDMT as BP tolerates. Consider SGLT2i prior to d/c - will await to see if EF recovers with AVR. Suspect she may need advanced therapies   3. Paroxysmal AFL, post-op - successful RAP on 11/19 - recurrence 11/14, converted to NSR w/ IV amio  - maintaining NSR today - continue amio 200 po bid  Transfer to 4E today.    Length of Stay: 7   Brittainy Delmer Islam  11/09/2021, 7:34 AM  Advanced Heart Failure Team Pager [phone] (M-F; 7a - 4p)  Please contact [facility] for night-coverage after hours (4p -7a ) and weekends on [email or web address]  Patient seen and examined with the above-signed Advanced Practice Provider and/or Housestaff. I personally reviewed laboratory data, imaging studies and relevant notes. I independently examined the patient and formulated the important aspects of the plan. I have edited the note to reflect any of my changes or salient points. I have personally discussed the plan with the patient and/or family.  Off milrinone. Went into rapid AF/AFL yesterday so ICU transfer held. Bolused with IV amio and converted back to NSR. Diuretics on hold for low CVP.   Feels ok. Denies dizziness, SOB, orthopnea or PND. Weight back to baseline. No further AF overnight.   General:  Well appearing. No resp difficulty HEENT: normal Neck: supple. no JVD. Carotids 2+ bilat; no bruits. No lymphadenopathy or thryomegaly appreciated. Cor:  Sternal wound ok PMI laterally displaced. Regular rate & rhythm. No rubs, gallops or murmurs. Lungs: clear Abdomen: soft, nontender, nondistended. No hepatosplenomegaly. No bruits or masses. Good bowel sounds. Extremities: no cyanosis, clubbing,  rash, edema Neuro: alert & orientedx3, cranial nerves grossly intact. moves all 4 extremities w/o difficulty. Affect pleasant  No further AF. Volume status looks good. Remains on midodrine for low BP. Start Eliquis.   Can transfer out of ICU. Follow on tele. Possible SGLT2i in am. Continue to ambulate.   Arvilla Meres, MD  9:05 AM

## 2021-11-09 NOTE — Progress Notes (Signed)
EVENING ROUNDS NOTE :     [address]       [place] [address]             [phone]                 7 Days Post-Op Procedure(s) (LRB): AORTIC VALVE REPLACEMENT (AVR) USING INSPIRIS VALVE SIZE (N/A) TRANSESOPHAGEAL ECHOCARDIOGRAM (TEE) (N/A) APPLICATION OF CELL SAVER REPAIR OF PATENT FORAMEN OVALE   Total Length of Stay:  LOS: 7 days  Events:   No events Awaiting floor bed    BP (!) 86/74 (BP Location: Left Arm)   Pulse (!) 106   Temp 98.6 F (37 C)   Resp (!) 23   Ht 5\' 7"  (1.702 m)   Wt 66 kg   SpO2 97%   BMI 22.79 kg/m          sodium chloride      I/O last 3 completed shifts: In: 923.2 [P.O.:480; I.V.:343.3; IV Piggyback:99.9] Out: 4800 [Urine:4800]   CBC Latest Ref Rng & Units 11/06/2021 11/05/2021 11/04/2021  WBC 4.0 - 10.5 K/uL 6.0 7.0 8.3  Hemoglobin 12.0 - 15.0 g/dL 13/08/2021) 8.5(I) 10.8(L)  Hematocrit 36.0 - 46.0 % 30.0(L) 29.7(L) 33.1(L)  Platelets 150 - 400 K/uL 171 130(L) 126(L)    BMP Latest Ref Rng & Units 11/09/2021 11/08/2021 11/07/2021  Glucose 70 - 99 mg/dL 96 13/11/2021) 035(K)  BUN 6 - 20 mg/dL 12 12 13   Creatinine 0.44 - 1.00 mg/dL 093(G 1.82  Sodium 135 - 145 mmol/L 133(L) 130(L) 131(L)  Potassium 3.5 - 5.1 mmol/L 4.2 3.7 3.9  Chloride 98 - 111 mmol/L 97(L) 90(L) 93(L)  CO2 22 - 32 mmol/L 25 27 30   Calcium 8.9 - 10.3 mg/dL 9.2 9.4 9.0    ABG    Component Value Date/Time   PHART 7.374 11/02/2021 1701   PCO2ART 43.5 11/02/2021 1701   PO2ART 101 11/02/2021 1701   HCO3 25.5 11/02/2021 1701   TCO2 27 11/02/2021 1701   ACIDBASEDEF 2.0 11/02/2021 1530   O2SAT 12.6 11/08/2021 0113       13/06/2021, MD 11/09/2021 5:57 PM

## 2021-11-09 NOTE — Progress Notes (Signed)
7 Days Post-Op Procedure(s) (LRB): AORTIC VALVE REPLACEMENT (AVR) USING INSPIRIS VALVE SIZE (N/A) TRANSESOPHAGEAL ECHOCARDIOGRAM (TEE) (N/A) APPLICATION OF CELL SAVER REPAIR OF PATENT FORAMEN OVALE Subjective: No complaints. Slept well with Restoril.  Objective: Vital signs in last 24 hours: Temp:  [98.4 F (36.9 C)-99.2 F (37.3 C)] 98.9 F (37.2 C) (11/14 0700) Pulse Rate:  [43-116] 116 (11/14 0600) Cardiac Rhythm: Normal sinus rhythm (11/14 0400) Resp:  [16-39] 39 (11/14 0600) BP: (90-113)/(60-85) 100/84 (11/14 0600) SpO2:  [86 %-100 %] 98 % (11/14 0600) Weight:  [66 kg-67.3 kg] 66 kg (11/14 0500)  Hemodynamic parameters for last 24 hours:    Intake/Output from previous day: 11/13 0701 - 11/14 0700 In: 727.7 [P.O.:480; I.V.:147.7; IV Piggyback:99.9] Out: 2800 [Urine:2800] Intake/Output this shift: No intake/output data recorded.  General appearance: alert and cooperative Neurologic: intact Heart: regular rate and rhythm, S1, S2 normal, 1/6 systolic flow murmur RSB. Lungs: clear to auscultation bilaterally Extremities: noedema Wound: incision healing well  Lab Results: No results for input(s): WBC, HGB, HCT, PLT in the last 72 hours. BMET:  Recent Labs    11/08/21 1007 11/09/21 0056  NA 130* 133*  K 3.7 4.2  CL 90* 97*  CO2 27 25  GLUCOSE 175* 96  BUN 12 12  CREATININE 0.79 0.69  CALCIUM 9.4 9.2    PT/INR: No results for input(s): LABPROT, INR in the last 72 hours. ABG    Component Value Date/Time   PHART 7.374 11/02/2021 1701   HCO3 25.5 11/02/2021 1701   TCO2 27 11/02/2021 1701   ACIDBASEDEF 2.0 11/02/2021 1530   O2SAT 12.6 11/08/2021 0113   CBG (last 3)  No results for input(s): GLUCAP in the last 72 hours.  Assessment/Plan: S/P Procedure(s) (LRB): AORTIC VALVE REPLACEMENT (AVR) USING INSPIRIS VALVE SIZE (N/A) TRANSESOPHAGEAL ECHOCARDIOGRAM (TEE) (N/A) APPLICATION OF CELL SAVER REPAIR OF PATENT FORAMEN OVALE  POD  7  Hemodynamically stable in sinus rhythm. BP is low normal on midodrine which limits GDMT. Continuing on amio and digoxin for AF. Will need to decide about anticoagulation for recurrent AF.  Wt is back to preop baseline.  Transfer to 4E. Plan home when ok with DB.   LOS: 7 days    Alleen Borne 11/09/2021

## 2021-11-10 DIAGNOSIS — I5022 Chronic systolic (congestive) heart failure: Secondary | ICD-10-CM | POA: Diagnosis not present

## 2021-11-10 DIAGNOSIS — Z952 Presence of prosthetic heart valve: Secondary | ICD-10-CM | POA: Diagnosis not present

## 2021-11-10 DIAGNOSIS — I484 Atypical atrial flutter: Secondary | ICD-10-CM

## 2021-11-10 LAB — BASIC METABOLIC PANEL
Anion gap: 11 (ref 5–15)
BUN: 19 mg/dL (ref 6–20)
CO2: 23 mmol/L (ref 22–32)
Calcium: 9.2 mg/dL (ref 8.9–10.3)
Chloride: 98 mmol/L (ref 98–111)
Creatinine, Ser: 0.7 mg/dL (ref 0.44–1.00)
GFR, Estimated: >60 mL/min (ref >60)
Glucose, Bld: 92 mg/dL (ref 70–99)
Potassium: 3.8 mmol/L (ref 3.5–5.1)
Sodium: 132 mmol/L — ABNORMAL LOW (ref 135–145)

## 2021-11-10 LAB — CBC
HCT: 34.3 % — ABNORMAL LOW (ref 36.0–46.0)
Hemoglobin: 11.3 g/dL — ABNORMAL LOW (ref 12.0–15.0)
MCH: 31.7 pg (ref 26.0–34.0)
MCHC: 32.9 g/dL (ref 30.0–36.0)
MCV: 96.3 fL (ref 80.0–100.0)
Platelets: 376 10*3/uL (ref 150–400)
RBC: 3.56 MIL/uL — ABNORMAL LOW (ref 3.87–5.11)
RDW: 12.4 % (ref 11.5–15.5)
WBC: 7.9 10*3/uL (ref 4.0–10.5)
nRBC: 0 % (ref 0.0–0.2)

## 2021-11-10 LAB — MAGNESIUM: Magnesium: 2 mg/dL (ref 1.7–2.4)

## 2021-11-10 LAB — DIGOXIN LEVEL: Digoxin Level: 0.5 ng/mL — ABNORMAL LOW (ref 0.8–2.0)

## 2021-11-10 MED ORDER — AMIODARONE IV BOLUS ONLY 150 MG/100ML
150.0000 mg | Freq: Once | INTRAVENOUS | Status: AC
  Administered 2021-11-10: 150 mg via INTRAVENOUS

## 2021-11-10 MED ORDER — POTASSIUM CHLORIDE CRYS ER 20 MEQ PO TBCR
40.0000 meq | EXTENDED_RELEASE_TABLET | Freq: Once | ORAL | Status: AC
  Administered 2021-11-10: 40 meq via ORAL
  Filled 2021-11-10: qty 2

## 2021-11-10 MED ORDER — AMIODARONE LOAD VIA INFUSION
150.0000 mg | Freq: Once | INTRAVENOUS | Status: AC
  Administered 2021-11-10: 150 mg via INTRAVENOUS
  Filled 2021-11-10: qty 83.34

## 2021-11-10 MED ORDER — AMIODARONE HCL IN DEXTROSE 360-4.14 MG/200ML-% IV SOLN
30.0000 mg/h | INTRAVENOUS | Status: DC
  Administered 2021-11-10 – 2021-11-11 (×2): 30 mg/h via INTRAVENOUS
  Filled 2021-11-10 (×2): qty 200

## 2021-11-10 MED ORDER — AMIODARONE HCL IN DEXTROSE 360-4.14 MG/200ML-% IV SOLN
60.0000 mg/h | INTRAVENOUS | Status: AC
  Administered 2021-11-10 (×3): 60 mg/h via INTRAVENOUS
  Filled 2021-11-10 (×3): qty 200

## 2021-11-10 NOTE — Progress Notes (Addendum)
Advanced Heart Failure Rounding Note   Subjective:    Did well most of the day yesterday and overnight. Slept well. No symptoms.   This am while brushing teeth, HR jumped up, 130s. SVT ? AFL. 12 lead ordered.  SBPs remain soft, low 90s. On midodrine 10 tid.   Remains off diuretics. Wt down 1 lb. Appetite is not great.  WBC 7.9 Hgb 11.3 K 3.8 Mg 2.0 Scr 0.70 Dig level 0.5    Objective:   Weight Range:  Vital Signs:   Temp:  [98.2 F (36.8 C)-98.6 F (37 C)] 98.2 F (36.8 C) (11/15 0732) Pulse Rate:  [36-106] 94 (11/15 0700) Resp:  [17-37] 37 (11/15 0700) BP: (86-106)/(63-74) 93/66 (11/15 0600) SpO2:  [89 %-99 %] 89 % (11/15 0700) Weight:  [65.4 kg] 65.4 kg (11/15 0500) Last BM Date: 11/09/21  Weight change: Filed Weights   11/08/21 0900 11/09/21 0500 11/10/21 0500  Weight: 67.3 kg 66 kg 65.4 kg    Intake/Output:   Intake/Output Summary (Last 24 hours) at 11/10/2021 0752 Last data filed at 11/09/2021 1600 Gross per 24 hour  Intake 960 ml  Output --  Net 960 ml     PHYSICAL EXAM: General:  Well appearing. No respiratory difficulty HEENT: normal Neck: supple. no JVD. Carotids 2+ bilat; no bruits. No lymphadenopathy or thyromegaly appreciated. Cor: PMI nondisplaced. Regular rhythm, tachy rate No rubs, gallops or murmurs. Sternotomy site ok. Lungs: clear Abdomen: soft, nontender, nondistended. No hepatosplenomegaly. No bruits or masses. Good bowel sounds. Extremities: no cyanosis, clubbing, rash, edema Neuro: alert & oriented x 3, cranial nerves grossly intact. moves all 4 extremities w/o difficulty. Affect pleasant.   Telemetry:  SVT 130s Personally reviewed   Labs: Basic Metabolic Panel: Recent Labs  Lab 11/06/21 0410 11/07/21 0418 11/08/21 1007 11/09/21 0056 11/10/21 0032  NA 133* 131* 130* 133* 132*  K 3.7 3.9 3.7 4.2 3.8  CL 97* 93* 90* 97* 98  CO2 29 30 27 25 23   GLUCOSE 88 100* 175* 96 92  BUN 8 13 12 12 19   CREATININE 0.66 0.55  0.79 0.69 0.70  CALCIUM 8.4* 9.0 9.4 9.2 9.2  MG 1.8 1.7 1.7 2.2 2.0    Liver Function Tests: No results for input(s): AST, ALT, ALKPHOS, BILITOT, PROT, ALBUMIN in the last 168 hours.  No results for input(s): LIPASE, AMYLASE in the last 168 hours. No results for input(s): AMMONIA in the last 168 hours.  CBC: Recent Labs  Lab 11/03/21 1700 11/04/21 0412 11/05/21 0418 11/06/21 0410 11/10/21 0032  WBC 9.7 8.3 7.0 6.0 7.9  HGB 11.0* 10.8* 9.7* 9.9* 11.3*  HCT 33.6* 33.1* 29.7* 30.0* 34.3*  MCV 101.5* 101.5* 101.4* 99.7 96.3  PLT 128* 126* 130* 171 376    Cardiac Enzymes: No results for input(s): CKTOTAL, CKMB, CKMBINDEX, TROPONINI in the last 168 hours.  BNP: BNP (last 3 results) Recent Labs    03/10/21 2253 03/13/21 0953 07/30/21 1504  BNP 2,348.9* 2,254.2* 677.2*    ProBNP (last 3 results) No results for input(s): PROBNP in the last 8760 hours.    Other results:  Imaging: No results found.   Medications:     Scheduled Medications:  amiodarone  200 mg Oral BID   apixaban  5 mg Oral BID   aspirin EC  81 mg Oral Daily   buPROPion  150 mg Oral BID   chlorhexidine  15 mL Mouth Rinse BID   Chlorhexidine Gluconate Cloth  6 each Topical Daily  digoxin  0.125 mg Oral Daily   midodrine  10 mg Oral TID WC   pantoprazole  40 mg Oral Daily   sodium chloride flush  3 mL Intravenous Q12H    Infusions:  sodium chloride      PRN Medications: sodium chloride, ondansetron (ZOFRAN) IV, oxyCODONE, polyvinyl alcohol, senna-docusate, sodium chloride flush, temazepam, traMADol   Assessment/Plan   1. Severe aortic insufficiency  - s/p bioprosthetic AVR 11/7 - stable post-op. Ambualting   2. Acute on chronic systolic HF due to NICM  - EF 41-32% severely dilated LV - Milrinone stopped 11/12 - Currently on midodrine 10 tid for BP support  - Voume status ok. Possibly a bit low. Continue to hold diuretics  - BP remains too soft for GDMT - w/ low volume status,  will hold off on starting SGLT2i today  - continue digoxin 0.125 (dig level 0.5)  - will await to see if EF recovers with AVR. Suspect she may need advanced therapies   3. Paroxysmal AFL, post-op - successful RAP on 11/19 - recurrence 11/14, converted to NSR w/ IV amio  - ? Back in AFL today vs ST, check 12 lead EKG  - continue amio 200 po bid - Eliquis 5 mg bid   Transfer to 4E today when bed available    Length of Stay: 8   Brittainy Simmons PA-C  11/10/2021, 7:52 AM  Advanced Heart Failure Team Pager [phone] (M-F; 7a - 4p)  Please contact [facility] for night-coverage after hours (4p -7a ) and weekends on [email or web address]  Patient seen and examined with the above-signed Advanced Practice Provider and/or Housestaff. I personally reviewed laboratory data, imaging studies and relevant notes. I independently examined the patient and formulated the important aspects of the plan. I have edited the note to reflect any of my changes or salient points. I have personally discussed the plan with the patient and/or family.  Back in AFL with RVR this am. Denies CP, sob, orthopnea or PND.   Remains on midodrine for BP support.   General: Sitting up in bed.  No resp difficulty HEENT: normal Neck: supple. no JVD. Carotids 2+ bilat; no bruits. No lymphadenopathy or thryomegaly appreciated. Cor: Sternal wound ok PMI laterally displaced. Irregular tachy. No rubs, gallops or murmurs. Lungs: clear Abdomen: soft, nontender, nondistended. No hepatosplenomegaly. No bruits or masses. Good bowel sounds. Extremities: no cyanosis, clubbing, rash, edema Neuro: alert & orientedx3, cranial nerves grossly intact. moves all 4 extremities w/o difficulty. Affect pleasant  Will bolus with IV amio and restart gtt. Continue Eliquis. Volume status ok. Continue midodrine for BP support.   Arvilla Meres, MD  9:19 AM

## 2021-11-10 NOTE — Progress Notes (Signed)
CARDIAC REHAB PHASE I   PRE:  Rate/Rhythm: 114 aflutter    BP: sitting 80/69    SaO2: 100 RA  MODE:  Ambulation: 1480 ft   POST:  Rate/Rhythm: 126 aflutter    BP: sitting 85/68     SaO2: 99 RA  BP low however pt without c/o except being cold walking. Tolerated well, just frustrated. Gave her HF and Off the Beat booklets to review.  [id number]   Harriet Masson CES, ACSM 11/10/2021 2:03 PM

## 2021-11-10 NOTE — Progress Notes (Addendum)
      [address]       [place] [address]             [phone]      8 Days Post-Op Procedure(s) (LRB): AORTIC VALVE REPLACEMENT (AVR) USING INSPIRIS VALVE SIZE (N/A) TRANSESOPHAGEAL ECHOCARDIOGRAM (TEE) (N/A) APPLICATION OF CELL SAVER REPAIR OF PATENT FORAMEN OVALE  Subjective:  Patient hoping to go home today.  She states she got up and went to the restroom and cleaned up a bit and is worn out.  Objective: Vital signs in last 24 hours: Temp:  [98.2 F (36.8 C)-98.6 F (37 C)] 98.2 F (36.8 C) (11/15 0732) Pulse Rate:  [36-106] 94 (11/15 0700) Cardiac Rhythm: Atrial flutter (11/15 0730) Resp:  [17-37] 37 (11/15 0700) BP: (86-105)/(63-74) 93/66 (11/15 0600) SpO2:  [89 %-99 %] 89 % (11/15 0700) Weight:  [65.4 kg] 65.4 kg (11/15 0500)  Intake/Output from previous day: 11/14 0701 - 11/15 0700 In: 960 [P.O.:960] Out: -   General appearance: alert, cooperative, and no distress Heart: regular rate and rhythm Lungs: clear to auscultation bilaterally Abdomen: soft, non-tender; bowel sounds normal; no masses,  no organomegaly Extremities: extremities normal, atraumatic, no cyanosis or edema Wound: clean and dry  Lab Results: Recent Labs    11/10/21 0032  WBC 7.9  HGB 11.3*  HCT 34.3*  PLT 376   BMET:  Recent Labs    11/09/21 0056 11/10/21 0032  NA 133* 132*  K 4.2 3.8  CL 97* 98  CO2 25 23  GLUCOSE 96 92  BUN 12 19  CREATININE 0.69 0.70  CALCIUM 9.2 9.2    PT/INR: No results for input(s): LABPROT, INR in the last 72 hours. ABG    Component Value Date/Time   PHART 7.374 11/02/2021 1701   HCO3 25.5 11/02/2021 1701   TCO2 27 11/02/2021 1701   ACIDBASEDEF 2.0 11/02/2021 1530   O2SAT 12.6 11/08/2021 0113   CBG (last 3)  No results for input(s): GLUCAP in the last 72 hours.  Assessment/Plan: S/P Procedure(s) (LRB): AORTIC VALVE REPLACEMENT (AVR) USING INSPIRIS VALVE SIZE (N/A) TRANSESOPHAGEAL ECHOCARDIOGRAM (TEE)  (N/A) APPLICATION OF CELL SAVER REPAIR OF PATENT FORAMEN OVALE  CV- Atrial Flutter, BP remains low- on Amiodarone, Digoxin, and Midodrine, Eliquis for PAF Pulm- no acute issues, continue IS Renal-remains stable, weight is at baseline, defer diuretics to AHF Dispo- patient back in Atrial Flutter today, HR elevated after being up and moving around the room, awaiting transfer to 4E, spoke with Dr. Gala Romney who states patient is not ready for d/c today   LOS: 8 days    Lowella Dandy, PA-C 11/10/2021   Chart reviewed, patient examined, agree with above. Back in atrial flutter with RVR today and IV amio resumed. Management more difficult due to very low EF which precludes using beta blocker or calcium channel blocker. Digoxin level 0.5. will discuss increasing dose with DB.

## 2021-11-11 DIAGNOSIS — Z952 Presence of prosthetic heart valve: Secondary | ICD-10-CM | POA: Diagnosis not present

## 2021-11-11 DIAGNOSIS — I484 Atypical atrial flutter: Secondary | ICD-10-CM | POA: Diagnosis not present

## 2021-11-11 LAB — BASIC METABOLIC PANEL
Anion gap: 9 (ref 5–15)
BUN: 18 mg/dL (ref 6–20)
CO2: 25 mmol/L (ref 22–32)
Calcium: 9.3 mg/dL (ref 8.9–10.3)
Chloride: 99 mmol/L (ref 98–111)
Creatinine, Ser: 0.85 mg/dL (ref 0.44–1.00)
GFR, Estimated: >60 mL/min (ref >60)
Glucose, Bld: 96 mg/dL (ref 70–99)
Potassium: 4.1 mmol/L (ref 3.5–5.1)
Sodium: 133 mmol/L — ABNORMAL LOW (ref 135–145)

## 2021-11-11 LAB — CBC
HCT: 31.1 % — ABNORMAL LOW (ref 36.0–46.0)
Hemoglobin: 10.7 g/dL — ABNORMAL LOW (ref 12.0–15.0)
MCH: 33.1 pg (ref 26.0–34.0)
MCHC: 34.4 g/dL (ref 30.0–36.0)
MCV: 96.3 fL (ref 80.0–100.0)
Platelets: 418 10*3/uL — ABNORMAL HIGH (ref 150–400)
RBC: 3.23 MIL/uL — ABNORMAL LOW (ref 3.87–5.11)
RDW: 12.4 % (ref 11.5–15.5)
WBC: 8.3 10*3/uL (ref 4.0–10.5)
nRBC: 0 % (ref 0.0–0.2)

## 2021-11-11 MED ORDER — APIXABAN 5 MG PO TABS: 5.0000 mg | ORAL_TABLET | Freq: Two times a day (BID) | ORAL | 1 refills | Status: DC

## 2021-11-11 MED ORDER — OXYCODONE HCL 5 MG PO TABS: 5.0000 mg | ORAL_TABLET | ORAL | 0 refills | Status: DC | PRN

## 2021-11-11 MED ORDER — AMIODARONE HCL 200 MG PO TABS: 200.0000 mg | ORAL_TABLET | Freq: Two times a day (BID) | ORAL | Status: DC

## 2021-11-11 MED ORDER — AMIODARONE HCL 200 MG PO TABS
400.0000 mg | ORAL_TABLET | Freq: Two times a day (BID) | ORAL | Status: DC
  Administered 2021-11-11: 400 mg via ORAL
  Filled 2021-11-11: qty 2

## 2021-11-11 MED ORDER — ASPIRIN 81 MG PO TBEC: 81.0000 mg | DELAYED_RELEASE_TABLET | Freq: Every day | ORAL | Status: AC

## 2021-11-11 MED ORDER — MIDODRINE HCL 10 MG PO TABS: 10.0000 mg | ORAL_TABLET | Freq: Three times a day (TID) | ORAL | 1 refills | Status: DC

## 2021-11-11 MED ORDER — AMIODARONE HCL 400 MG PO TABS: 400.0000 mg | ORAL_TABLET | Freq: Two times a day (BID) | ORAL | 1 refills | Status: DC

## 2021-11-11 NOTE — TOC CM/SW Note (Signed)
HF TOC CM reviewed chart for dc needs. No dc needs identified. Isidoro Donning RN3 CCM, Heart Failure TOC CM [phone]

## 2021-11-11 NOTE — Progress Notes (Addendum)
Advanced Heart Failure Rounding Note   Subjective:    Developed recurrent AFL w/ RVR yesterday, 130s-140s. Got IV amio bolus + drip.   Converted back to NSR ~2am today.   BP still soft, systolics low 90s. Off diuretics. Wt up 1 lb but remains at pre-op wt.   K 4.1 Mg 2.0   Feels better today. No dyspnea. No dizziness.    Objective:   Weight Range:  Vital Signs:   Temp:  [97.9 F (36.6 C)-98.6 F (37 C)] 98.2 F (36.8 C) (11/16 0700) Pulse Rate:  [52-116] 81 (11/16 0900) Resp:  [15-28] 17 (11/16 0600) BP: (81-104)/(66-87) 94/68 (11/16 0600) SpO2:  [92 %-99 %] 93 % (11/16 0600) Weight:  [66.2 kg] 66.2 kg (11/16 0500) Last BM Date: 11/09/21  Weight change: Filed Weights   11/09/21 0500 11/10/21 0500 11/11/21 0500  Weight: 66 kg 65.4 kg 66.2 kg    Intake/Output:   Intake/Output Summary (Last 24 hours) at 11/11/2021 0941 Last data filed at 11/10/2021 1900 Gross per 24 hour  Intake 1040.53 ml  Output 350 ml  Net 690.53 ml    PHYSICAL EXAM: General:  Well appearing. No respiratory difficulty HEENT: normal Neck: supple. no JVD. Carotids 2+ bilat; no bruits. No lymphadenopathy or thyromegaly appreciated. Cor: PMI nondisplaced. Regular rate & rhythm. No rubs, gallops or murmurs. Sternotomy site ok  Lungs: clear Abdomen: soft, nontender, nondistended. No hepatosplenomegaly. No bruits or masses. Good bowel sounds. Extremities: no cyanosis, clubbing, rash, edema Neuro: alert & oriented x 3, cranial nerves grossly intact. moves all 4 extremities w/o difficulty. Affect pleasant.  Telemetry:  NSR 90s  Personally reviewed   Labs: Basic Metabolic Panel: Recent Labs  Lab 11/06/21 0410 11/07/21 0418 11/08/21 1007 11/09/21 0056 11/10/21 0032 11/11/21 0059  NA 133* 131* 130* 133* 132* 133*  K 3.7 3.9 3.7 4.2 3.8 4.1  CL 97* 93* 90* 97* 98 99  CO2 29 30 27 25 23 25   GLUCOSE 88 100* 175* 96 92 96  BUN 8 13 12 12 19 18   CREATININE 0.66 0.55 0.79 0.69 0.70 0.85   CALCIUM 8.4* 9.0 9.4 9.2 9.2 9.3  MG 1.8 1.7 1.7 2.2 2.0  --     Liver Function Tests: No results for input(s): AST, ALT, ALKPHOS, BILITOT, PROT, ALBUMIN in the last 168 hours.  No results for input(s): LIPASE, AMYLASE in the last 168 hours. No results for input(s): AMMONIA in the last 168 hours.  CBC: Recent Labs  Lab 11/05/21 0418 11/06/21 0410 11/10/21 0032 11/11/21 0059  WBC 7.0 6.0 7.9 8.3  HGB 9.7* 9.9* 11.3* 10.7*  HCT 29.7* 30.0* 34.3* 31.1*  MCV 101.4* 99.7 96.3 96.3  PLT 130* 171 376 418*    Cardiac Enzymes: No results for input(s): CKTOTAL, CKMB, CKMBINDEX, TROPONINI in the last 168 hours.  BNP: BNP (last 3 results) Recent Labs    03/10/21 2253 03/13/21 0953 07/30/21 1504  BNP 2,348.9* 2,254.2* 677.2*    ProBNP (last 3 results) No results for input(s): PROBNP in the last 8760 hours.    Other results:  Imaging: No results found.   Medications:     Scheduled Medications:  apixaban  5 mg Oral BID   aspirin EC  81 mg Oral Daily   buPROPion  150 mg Oral BID   chlorhexidine  15 mL Mouth Rinse BID   Chlorhexidine Gluconate Cloth  6 each Topical Daily   digoxin  0.125 mg Oral Daily   midodrine  10 mg  Oral TID WC   pantoprazole  40 mg Oral Daily   sodium chloride flush  3 mL Intravenous Q12H    Infusions:  sodium chloride     amiodarone 30 mg/hr (11/11/21 0301)    PRN Medications: sodium chloride, ondansetron (ZOFRAN) IV, oxyCODONE, polyvinyl alcohol, senna-docusate, sodium chloride flush, temazepam, traMADol   Assessment/Plan   1. Severe aortic insufficiency  - s/p bioprosthetic AVR 11/7 - stable post-op. Ambualting   2. Acute on chronic systolic HF due to NICM  - EF 30-16% severely dilated LV - Milrinone stopped 11/12 - Currently on midodrine 10 tid for BP support  - Voume status ok. At preop wt. Continue to hold diuretics - BP remains too soft for GDMT - w/ low volume status, will hold off on starting SGLT2i today  - continue  digoxin 0.125 (dig level 0.5)  - will await to see if EF recovers with AVR. Suspect she may need advanced therapies   3. Paroxysmal AFL, post-op - successful RAP on 11/19 - recurrence 11/14, converted to NSR w/ IV amio  - recurrence 11/15, converted w/ IV amio  - NSR today  - switch back to PO amio today, 400 mg bid x 7 days w/ taper  - Eliquis 5 mg bid   Awaiting bed on 4E.    Length of Stay: 9   Brittainy Simmons PA-C  11/11/2021, 9:41 AM  Advanced Heart Failure Team Pager [phone] (M-F; 7a - 4p)  Please contact [facility] for night-coverage after hours (4p -7a ) and weekends on [email or web address]  Patient seen and examined with the above-signed Advanced Practice Provider and/or Housestaff. I personally reviewed laboratory data, imaging studies and relevant notes. I independently examined the patient and formulated the important aspects of the plan. I have edited the note to reflect any of my changes or salient points. I have personally discussed the plan with the patient and/or family.  Converted to NSR early this am on IV amio. Feels ok. Very anxious to go home No CP or SOB.   General:  Well appearing. No resp difficulty HEENT: normal Neck: supple. no JVD. Carotids 2+ bilat; no bruits. No lymphadenopathy or thryomegaly appreciated. Cor: sternal wound ok PMI nondisplaced. Regular rate & rhythm. No rubs, gallops or murmurs. Lungs: clear Abdomen: soft, nontender, nondistended. No hepatosplenomegaly. No bruits or masses. Good bowel sounds. Extremities: no cyanosis, clubbing, rash, edema Neuro: alert & orientedx3, cranial nerves grossly intact. moves all 4 extremities w/o difficulty. Affect pleasant  Back in NSR. Can switch IV amio to po. She is very anxious to go home. I am ok with d/c today with close f/u if ok with Dr. Laneta Simmers.   Arvilla Meres, MD  10:08 AM

## 2021-11-11 NOTE — Progress Notes (Signed)
9 Days Post-Op Procedure(s) (LRB): AORTIC VALVE REPLACEMENT (AVR) USING INSPIRIS VALVE SIZE (N/A) TRANSESOPHAGEAL ECHOCARDIOGRAM (TEE) (N/A) APPLICATION OF CELL SAVER REPAIR OF PATENT FORAMEN OVALE Subjective: No complaints  Converted to sinus rhythm around 2 am on IV amio  Objective: Vital signs in last 24 hours: Temp:  [97.9 F (36.6 C)-98.6 F (37 C)] 98.2 F (36.8 C) (11/16 0700) Pulse Rate:  [52-123] 70 (11/16 0600) Cardiac Rhythm: Normal sinus rhythm (11/16 0400) Resp:  [15-28] 17 (11/16 0600) BP: (81-104)/(66-87) 94/68 (11/16 0600) SpO2:  [92 %-99 %] 93 % (11/16 0600) Weight:  [66.2 kg] 66.2 kg (11/16 0500)  Hemodynamic parameters for last 24 hours:    Intake/Output from previous day: 11/15 0701 - 11/16 0700 In: 1281.5 [P.O.:960; I.V.:321.5] Out: 350 [Urine:350] Intake/Output this shift: No intake/output data recorded.  General appearance: alert and cooperative Heart: regular rate and rhythm, S1, S2 normal, no murmur Lungs: clear to auscultation bilaterally Wound: incision healing well  Lab Results: Recent Labs    11/10/21 0032 11/11/21 0059  WBC 7.9 8.3  HGB 11.3* 10.7*  HCT 34.3* 31.1*  PLT 376 418*   BMET:  Recent Labs    11/10/21 0032 11/11/21 0059  NA 132* 133*  K 3.8 4.1  CL 98 99  CO2 23 25  GLUCOSE 92 96  BUN 19 18  CREATININE 0.70 0.85  CALCIUM 9.2 9.3    PT/INR: No results for input(s): LABPROT, INR in the last 72 hours. ABG    Component Value Date/Time   PHART 7.374 11/02/2021 1701   HCO3 25.5 11/02/2021 1701   TCO2 27 11/02/2021 1701   ACIDBASEDEF 2.0 11/02/2021 1530   O2SAT 12.6 11/08/2021 0113   CBG (last 3)  No results for input(s): GLUCAP in the last 72 hours.  Assessment/Plan: S/P Procedure(s) (LRB): AORTIC VALVE REPLACEMENT (AVR) USING INSPIRIS VALVE SIZE (N/A) TRANSESOPHAGEAL ECHOCARDIOGRAM (TEE) (N/A) APPLICATION OF CELL SAVER REPAIR OF PATENT FORAMEN OVALE  POD 9 Doing well except for recurrent  atrial fib/flutter. Has converted on IV amio. Would like to get off peripheral IV amio because she will develop phlebitis with this. Dr. Gala Romney will decide about that.   BP mid 90's. Could try decreasing midodrine some. She was on 2.5 bid at home with Coreg, spiro and losartan.  Waiting on 4E bed.    LOS: 9 days    Pamela Bryan 11/11/2021

## 2021-11-11 NOTE — Progress Notes (Signed)
Pt feeling well, in NSR. Ambulating independently. Brief discussion of smoking cessation and tips for distractions when she is tempted. She feels strongly that she will not smoke.  Ethelda Chick CES, ACSM 9:00 AM 11/11/2021 [phone]

## 2021-11-12 ENCOUNTER — Other Ambulatory Visit (HOSPITAL_COMMUNITY): Payer: Self-pay | Admitting: *Deleted

## 2021-11-12 MED ORDER — DIGOXIN 125 MCG PO TABS: 0.1250 mg | ORAL_TABLET | Freq: Every day | ORAL | 2 refills | Status: DC

## 2021-11-16 ENCOUNTER — Telehealth (HOSPITAL_COMMUNITY): Payer: Self-pay

## 2021-11-16 NOTE — Telephone Encounter (Signed)
Attempted to call patient in regards to Cardiac Rehab - LM on VM 

## 2021-11-23 LAB — ECHO INTRAOPERATIVE TEE
Height: 67 in
Weight: 2320 oz

## 2021-11-27 ENCOUNTER — Ambulatory Visit (HOSPITAL_COMMUNITY)
Admit: 2021-11-27 | Discharge: 2021-11-27 | Disposition: A | Discharge status: Home / Self Care | Payer: No Typology Code available for payment source | Attending: Family Medicine | Admitting: Family Medicine

## 2021-11-27 ENCOUNTER — Encounter (HOSPITAL_COMMUNITY): Payer: Self-pay

## 2021-11-27 VITALS — BP 112/78 | HR 88 | Wt 150.6 lb

## 2021-11-27 DIAGNOSIS — K7460 Unspecified cirrhosis of liver: Secondary | ICD-10-CM | POA: Insufficient documentation

## 2021-11-27 DIAGNOSIS — R0902 Hypoxemia: Secondary | ICD-10-CM | POA: Diagnosis not present

## 2021-11-27 DIAGNOSIS — G4734 Idiopathic sleep related nonobstructive alveolar hypoventilation: Secondary | ICD-10-CM

## 2021-11-27 DIAGNOSIS — Z720 Tobacco use: Secondary | ICD-10-CM

## 2021-11-27 DIAGNOSIS — Q231 Congenital insufficiency of aortic valve: Secondary | ICD-10-CM | POA: Diagnosis not present

## 2021-11-27 DIAGNOSIS — Z79899 Other long term (current) drug therapy: Secondary | ICD-10-CM | POA: Diagnosis not present

## 2021-11-27 DIAGNOSIS — I5082 Biventricular heart failure: Secondary | ICD-10-CM | POA: Insufficient documentation

## 2021-11-27 DIAGNOSIS — I110 Hypertensive heart disease with heart failure: Secondary | ICD-10-CM | POA: Diagnosis present

## 2021-11-27 DIAGNOSIS — Z953 Presence of xenogenic heart valve: Secondary | ICD-10-CM | POA: Insufficient documentation

## 2021-11-27 DIAGNOSIS — I5022 Chronic systolic (congestive) heart failure: Secondary | ICD-10-CM | POA: Diagnosis not present

## 2021-11-27 DIAGNOSIS — I9789 Other postprocedural complications and disorders of the circulatory system, not elsewhere classified: Secondary | ICD-10-CM | POA: Insufficient documentation

## 2021-11-27 DIAGNOSIS — I351 Nonrheumatic aortic (valve) insufficiency: Secondary | ICD-10-CM | POA: Diagnosis not present

## 2021-11-27 DIAGNOSIS — Z7901 Long term (current) use of anticoagulants: Secondary | ICD-10-CM | POA: Insufficient documentation

## 2021-11-27 DIAGNOSIS — I480 Paroxysmal atrial fibrillation: Secondary | ICD-10-CM | POA: Diagnosis not present

## 2021-11-27 DIAGNOSIS — Z952 Presence of prosthetic heart valve: Secondary | ICD-10-CM

## 2021-11-27 DIAGNOSIS — F1010 Alcohol abuse, uncomplicated: Secondary | ICD-10-CM | POA: Diagnosis not present

## 2021-11-27 DIAGNOSIS — Z87891 Personal history of nicotine dependence: Secondary | ICD-10-CM | POA: Diagnosis not present

## 2021-11-27 DIAGNOSIS — K7469 Other cirrhosis of liver: Secondary | ICD-10-CM

## 2021-11-27 LAB — BASIC METABOLIC PANEL
Anion gap: 6 (ref 5–15)
BUN: 21 mg/dL — ABNORMAL HIGH (ref 6–20)
CO2: 27 mmol/L (ref 22–32)
Calcium: 9.5 mg/dL (ref 8.9–10.3)
Chloride: 105 mmol/L (ref 98–111)
Creatinine, Ser: 1.35 mg/dL — ABNORMAL HIGH (ref 0.44–1.00)
GFR, Estimated: 48 mL/min — ABNORMAL LOW (ref >60)
Glucose, Bld: 95 mg/dL (ref 70–99)
Potassium: 4.6 mmol/L (ref 3.5–5.1)
Sodium: 138 mmol/L (ref 135–145)

## 2021-11-27 LAB — CBC
HCT: 39.3 % (ref 36.0–46.0)
Hemoglobin: 12.6 g/dL (ref 12.0–15.0)
MCH: 30.9 pg (ref 26.0–34.0)
MCHC: 32.1 g/dL (ref 30.0–36.0)
MCV: 96.3 fL (ref 80.0–100.0)
Platelets: 345 10*3/uL (ref 150–400)
RBC: 4.08 MIL/uL (ref 3.87–5.11)
RDW: 12.7 % (ref 11.5–15.5)
WBC: 6.7 10*3/uL (ref 4.0–10.5)
nRBC: 0 % (ref 0.0–0.2)

## 2021-11-27 LAB — DIGOXIN LEVEL: Digoxin Level: 0.9 ng/mL (ref 0.8–2.0)

## 2021-11-27 MED ORDER — AMIODARONE HCL 200 MG PO TABS: 200.0000 mg | ORAL_TABLET | Freq: Every day | ORAL | 6 refills | Status: DC

## 2021-11-27 NOTE — Patient Instructions (Signed)
DECREASE Amiodarone to 200 mg, one tab daily  Labs today We will only contact you if something comes back abnormal or we need to make some changes. Otherwise no news is good news!  Your physician recommends that you schedule a follow-up appointment in: 3-4 weeks with the pharmacy team, 6-8 weeks  in the [facility] Pract[facility] ([facility]/NP[facility] and in 12 weeks with Dr Gala Romney and echo  Your physician has requested that you have an echocardiogram. Echocardiography is a painless test that uses sound waves to create images of your heart. It provides your doctor with information about the size and shape of your heart and how well your heart's chambers and valves are working. This procedure takes approximately one hour. There are no restrictions for this procedure.  Do the following things EVERYDAY: Weigh yourself in the morning before breakfast. Write it down and keep it in a log. Take your medicines as prescribed Eat low salt foods--Limit salt (sodium) to 2000 mg per day.  Stay as active as you can everyday Limit all fluids for the day to less than 2 liters  At the [facility], you and your health needs are our priority. As part of our continuing mission to provide you with exceptional heart care, we have created designated Provider Care Teams. These Care Teams include your primary Cardiologist (physician) and Advanced Practice Providers (APPs- Physician Assistants and Nurse Practitioners) who all work together to provide you with the care you need, when you need it.   You may see any of the following providers on your designated Care Team at your next follow up: Dr Arvilla Meres Dr Carron Curie, NP Robbie Lis, [place] [facility] Dresser, [place] Karle Plumber, PharmD   Please be sure to bring in all your medications bottles to every appointment.   If you have any questions or concerns before your next appointment please send [place] a message through  [place] or call our office at [phone].    TO LEAVE A MESSAGE FOR THE NURSE SELECT OPTION 2, PLEASE LEAVE A MESSAGE INCLUDING: YOUR NAME DATE OF BIRTH CALL BACK NUMBER REASON FOR CALL**this is important as we prioritize the call backs  YOU WILL RECEIVE A CALL BACK THE SAME DAY AS LONG AS YOU CALL BEFORE 4:00 PM

## 2021-11-27 NOTE — Progress Notes (Addendum)
Advanced Heart Failure Clinic Note   PCP: [facility]  Randol Kern FNP  HF Cardiologist: Dr Haroldine Laws  HPI: Pamela Bryan is a 50 y.o. female with PMH of alcohol use disorder, tobacco use disorder and systolic HF due to NICM.   Admitted with 3/22 with acute systolic HF. EF 15-20% Cath with preserved cardiac output and severely reduced EF. MRI EF 11% concerning for possible myocarditis. Started on GDMT. Counseled stop drinking alcohol.   Echo 8/22 EF 20% severely dilated RV normal AoV functionally bicuspid mild AS at least moderate AI (reviewed by Dr. Haroldine Laws).   Losartan added at her follow up and she was scheduled for TEE to evaluate AV (? Bicuspid & severe AI) but cancelled as she did not have anyone to take her to procedure.  TEE (9/22) EF 20% with a severely dilated left ventricle and severe aortic insufficiency.  Right ventricular systolic function is moderately depressed.   [place] 11/22 showed well compensated filling pressures, moderately reduced CO 2/2 severe AI and LV dysfunction. She underwent aortic valve replacement with bioprosthetic valve 11/22. Hospitalization c/b post-op AF/ALF requiring IV amio and rapid pacing to NSR. Unfortunately went back into AFL, and started on Eliquis. Carvedilol, Farxiga, Lasix, losartan, spiro and ivabradine stopped at discharge, weight 145 lbs.  Today she returns for post hospitalization follow up from recent AVR. Overall feeling fine. She is staying with her mother for now, off all narcotic pain medications. Dizziness improved after stopping pain medications. She has no significant dyspnea with activity. Denies ischemic CP, palpitations, abnormal bleeding, edema, or PND/Orthopnea. Appetite ok. No fever or chills. Taking all medications. Will start CR after cleared by CVTS. No further vaping or ETOH use.  Cardiac studies: - RHC (11/22):    RA = 5 RV = 27/6 PA = 27/12 (20) PCW = 15 Fick cardiac output/index = 3.7/2.1 PVR = 1.4 WU Ao sat =  99% PA sat = 66%, 69%   Assessment: 1. Well compensate filling pressure with moderately reduced CO in setting of severe AI and significant LV dysfunction  - TEE (9/22): EF 20%, severely dilated LV and severe AI, RV moderately depressed.  - Echo (3/22): EF < 20% RV normal  - RHC/LHC 02/2021  Ao = 86/68 (77) LV = 92/25 RA =  4 RV = 36/10 PA = 36/17 (25) PCW = 20 Fick cardiac output/index = 5.4/3.0 PVR = 1.0 WU SVR = 1088 Ao sat = 98% PA sat = 74%, 78% High SVC sat = 83%   Assessment: 1. Normal coronary arteries 2. 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 Elevated filling pressures 4. Cardiac output higher than I expected. Suspect peripheral shunting in setting of probable cirrhosis  - cMRI (02/2021):Marland Kitchen Severely dilated left ventricle with EF 11%, diffuse hypokinesis.  2.  Mildly dilated right ventricle with EF 33%.  3. Abnormal aortic valve, looks functionally bicuspid. By regurgitant fraction, there appears to be severe aortic stenosis. Would consider TEE confirmation.  4. Delayed enhancement imaging shows inferoseptal RV insertion site LGE which tends to be nonspecific and related to volume/pressure overload. However, the lateral wall subepicardial LGE is concerning for possible prior myocarditis (less likely sarcoidosis). It is not in a CAD pattern.  ROS: All systems negative except as listed in HPI, PMH and Problem List.  SH:  Social History   Socioeconomic History   Marital status: Divorced    Spouse name: Not on file   Number of children: Not on file  Years of education: Not on file   Highest education level: Not on file  Occupational History   Not on file  Tobacco Use   Smoking status: Former    Packs/day: 1.00    Years: 30.00    Pack years: 30.00    Types: Cigarettes    Quit date: 09/26/2020    Years since quitting: 1.1   Smokeless tobacco: Never  Vaping Use   Vaping Use: Never used  Substance and Sexual Activity   Alcohol use: Yes    Comment: occasional   Drug  use: No   Sexual activity: Not on file  Other Topics Concern   Not on file  Social History Narrative   Not on file   Social Determinants of Health   Financial Resource Strain: Not on file  Food Insecurity: Not on file  Transportation Needs: Not on file  Physical Activity: Not on file  Stress: Not on file  Social Connections: Not on file  Intimate Partner Violence: Not on file   FH:  Family History  Problem Relation Age of Onset   Valvular heart disease Sister    Past Medical History:  Diagnosis Date   CHF (congestive heart failure) ([place])    Coronary artery disease    History of hiatal hernia    Hypertension    Current Outpatient Medications  Medication Sig Dispense Refill   amiodarone (PACERONE) 400 MG tablet Take 400 mg by mouth daily.     apixaban (ELIQUIS) 5 MG TABS tablet Take 1 tablet (5 mg total) by mouth 2 (two) times daily. 60 tablet 1   aspirin EC 81 MG EC tablet Take 1 tablet (81 mg total) by mouth daily. Swallow whole.     buPROPion (WELLBUTRIN SR) 150 MG 12 hr tablet Take 150 mg by mouth 2 (two) times daily.     digoxin (LANOXIN) 0.125 MG tablet Take 1 tablet (0.125 mg total) by mouth daily. 30 tablet 2   diphenhydrAMINE (BENADRYL) 25 MG tablet Take 25 mg by mouth at bedtime as needed for allergies.     levonorgestrel (LILETTA, 52 MG,) 19.5 MCG/DAY IUD IUD 1 Intra Uterine Device by Intrauterine route once.     midodrine (PROAMATINE) 10 MG tablet Take 1 tablet (10 mg total) by mouth 3 (three) times daily with meals. 90 tablet 1   Multiple Vitamin (MULTIVITAMIN) capsule Take 1 capsule by mouth daily.     No current facility-administered medications for this encounter.   BP 112/78   Pulse 88   Wt 68.3 kg   SpO2 98%   BMI 23.59 kg/m   Wt Readings from Last 3 Encounters:  11/27/21 68.3 kg  11/11/21 66.2 kg  10/30/21 65.8 kg   PHYSICAL EXAM: General:  NAD. No resp difficulty HEENT: Normal Neck: Supple. No JVD. Carotids 2+ bilat; no bruits. No  lymphadenopathy or thryomegaly appreciated. Cor: PMI nondisplaced. Regular rate & rhythm. No rubs, gallops or murmurs. Sternal incision approximated, no drainage. Lungs: Clear Abdomen: Soft, nontender, nondistended. No hepatosplenomegaly. No bruits or masses. Good bowel sounds. Extremities: No cyanosis, clubbing, rash, edema Neuro: Alert & oriented x 3, cranial nerves grossly intact. Moves all 4 extremities w/o difficulty. Affect pleasant.  ECG (personally reviewed): SR 77 bpm   ReDs: 26%  ASSESSMENT & PLAN: 1. Chronic systolic CHF, Biventricular CHF - Echo (03/12/21): EF 15% severe RV dysfunction. - Cath (03/13/21): EF 15% No CAD. Elevated filling pressures. High cardiac output.  - cMRI 03/16/2021  EF < 20%, possible myocaritis.  -  Possible ETOH CM. TSH normal, UDS negative. - Echo (07/30/21): EF 20% severely dilated RV normal AoV functionally bicuspid, mild AS at least moderate AI. - TEE (9/22): EF 20%, severely dilated LV, RV moderately depressed, severe AI. - RHC (11/22): Well-compensated filling pressures, mod reduced CO 2/2 severe AI and LV dysfunction - Stable NYHA II, volume status looks good today, ReDs 26%. - GDMT limited by BP.  - Borderline ReDs today, hold off adding back SGLT2i. - Continue digoxin 0.125 mg daily. - Continue midodrine 10 mg tid.  - Plan for echo in 3 months. If EF not recovered, need to think about advanced therapies. - BMET and dig level today.  2. Paroxysmal AFL, post-op - Required IV amio and RAP. - Off ivabradine with AFL/AF - NSR on ECG today.  - Decrease amio to 200 mg daily. - Continue Eliquis 5 mg bid. - CBC today.   3. Severe aortic insufficiency - s/p bioprosthetic AVR 11/22  4. H/o cirrhosis - Suggestive on U/S 3/22. - On midodrine  5. Alcohol Use disorder: - Congratulated on stopping.   6. Tobacco Use disorder: - No longer vaping, congratulated.  7. Night Time Hypoxia - Sleep study negative for OSA.  - Has night time  hypoxia.  Follow up 3 weeks with PharmD (add Farxiga back if able), 6 weeks with APP and 12 weeks with Dr. Gala Romney + echo.  [facility] FNP 11/27/21 10:51 AM

## 2021-11-27 NOTE — Progress Notes (Signed)
ReDS Vest / Clip - 11/27/21 1100       ReDS Vest / Clip   Station Marker C    Ruler Value 27.5    ReDS Value Range Low volume    ReDS Actual Value 26

## 2021-12-01 ENCOUNTER — Other Ambulatory Visit: Payer: Self-pay | Admitting: Surgery

## 2021-12-01 DIAGNOSIS — Z952 Presence of prosthetic heart valve: Secondary | ICD-10-CM

## 2021-12-02 ENCOUNTER — Ambulatory Visit (INDEPENDENT_AMBULATORY_CARE_PROVIDER_SITE_OTHER): Payer: Self-pay | Admitting: Surgery

## 2021-12-02 ENCOUNTER — Other Ambulatory Visit: Payer: Self-pay

## 2021-12-02 ENCOUNTER — Encounter: Payer: Self-pay | Admitting: Surgery

## 2021-12-02 ENCOUNTER — Ambulatory Visit
Admission: RE | Admit: 2021-12-02 | Discharge: 2021-12-02 | Disposition: A | Discharge status: Home / Self Care | Payer: No Typology Code available for payment source | Source: Ambulatory Visit | Attending: Surgery | Admitting: Surgery

## 2021-12-02 VITALS — BP 134/85 | HR 95 | Resp 20 | Ht 67.0 in | Wt 150.0 lb

## 2021-12-02 DIAGNOSIS — Z952 Presence of prosthetic heart valve: Secondary | ICD-10-CM

## 2021-12-02 NOTE — Progress Notes (Signed)
PCP: [facility]  MS morrison FNP  HF Cardiologist: Dr Gala Romney  HPI:  Pamela Bryan is a 50 y.o. female with PMH of alcohol use disorder, tobacco use disorder and systolic HF due to NICM.   Admitted with 02/2021 with acute systolic HF. EF 15-20%. Cath with preserved cardiac output and severely reduced EF. MRI EF 11% concerning for possible myocarditis. Started on GDMT. Counseled to stop drinking alcohol.    Echo 07/2021 EF 20% severely dilated RV normal AoV functionally bicuspid mild AS at least moderate AI (reviewed by Dr. Gala Romney).    Losartan added at her follow up and she was scheduled for TEE to evaluate AV (? Bicuspid & severe AI) but cancelled as she did not have anyone to take her to procedure.  TEE (08/2021) EF 20% with a severely dilated left ventricle and severe aortic insufficiency.  Right ventricular systolic function is moderately depressed.    RHC 10/2021 showed well compensated filling pressures, moderately reduced CO 2/2 severe AI and LV dysfunction. She underwent aortic valve replacement with bioprosthetic valve 10/2021. Hospitalization c/b post-op AF/ALF requiring IV amiodarone and rapid pacing to NSR. Unfortunately went back into AFL, and was started on Eliquis. Carvedilol, Farxiga, Lasix, losartan, spironolactone and ivabradine stopped at discharge, weight 145 lbs.   Recently presented to [facility] for post hospitalization follow up from recent AVR on 11/27/21. Overall was feeling fine. She is staying with her mother for now, off all narcotic pain medications. Dizziness had improved after stopping pain medications. She had no significant dyspnea with activity. Denied ischemic CP, palpitations, abnormal bleeding, edema, or PND/Orthopnea. Appetite was ok. No fever or chills. Reported taking all medications. Will start CR after cleared by CVTS. No further vaping or ETOH use.  Today she returns to HF clinic for pharmacist medication titration. At last visit with APP, Amiodarone was  decreased to 200 mg daily.  Additionally, midodrine was decreased to 5 mg TID on 12/02/21 with CVTS. She is going to start cardiac rehab on 01/07/22. Overall she is feeling well today. No dizziness, lightheadedness, chest pain or palpitations. Says she is not as tired as she was before but still trying to "take it easy". No SOB/DOE. Weight has been stable at home, ~150 lbs. No LEE, PND or orthopnea. BP at home 111-114/80s. Says home BP has been stable since decreasing the midodrine. BP in clinic 134/88.    HF Medications: Digoxin 0.125 mg daily   Has the patient been experiencing any side effects to the medications prescribed?  no  Does the patient have any problems obtaining medications due to transportation or finances?   No; Hospital doctor  Understanding of regimen: good Understanding of indications: good Potential of compliance: good Patient understands to avoid NSAIDs. Patient understands to avoid decongestants.    Pertinent Lab Values: 11/27/21: Serum creatinine 1.35., BUN 18, Potassium 4.7, Sodium 137 BMET today pending  Vital Signs: Weight: 150.6 lbs (last clinic weight: 150 lbs) Blood pressure: 134/88  Heart rate: 98   Assessment/Plan: 1. Chronic systolic CHF, Biventricular CHF -- Echo (03/12/21): EF 15% severe RV dysfunction. - Cath (03/13/21): EF 15% No CAD. Elevated filling pressures. High cardiac output.  - cMRI 03/16/2021  EF < 20%, possible myocarditis.  - Possible ETOH CM. TSH normal, UDS negative. - Echo (07/30/21): EF 20% severely dilated RV normal AoV functionally bicuspid, mild AS at least moderate AI. - TEE (08/2021): EF 20%, severely dilated LV, RV moderately depressed, severe AI. - RHC (10/2021): Well-compensated filling pressures, mod reduced  CO 2/2 severe AI and LV dysfunction - Stable NYHA II, euvolemic on exam - BMET today pending - GDMT limited by BP. - Continue digoxin 0.125 mg daily. - Decrease midodrine to 2.5 mg TID. Instructed to increase  back up to 5 mg TID if SBP consistently <95 or if she gets dizzy.  - Plan for echo in 3 months (02/2022). If EF not recovered, need to think about advanced therapies.   2. Paroxysmal AFL, post-op - Required IV amiodarone and RAP. - Off ivabradine with AFL/AF - NSR on ECG 11/27/21.  - Continue amiodarone 200 mg daily. - Continue Eliquis 5 mg BID.   3. Severe aortic insufficiency - s/p bioprosthetic AVR 10/2021   4. H/o cirrhosis - Suggestive on U/S 02/2021. - On midodrine   5. Alcohol Use disorder: - Congratulated on stopping.   6. Tobacco Use disorder: - No longer vaping.   7. Night Time Hypoxia - Sleep study negative for OSA.  - Has night time hypoxia.    Follow up 3 weeks with APP Clinic. May be able to restart Farxiga if Scr improves on today's labs.    Audry Riles, PharmD, BCPS, BCCP, CPP Heart Failure Clinic Pharmacist [phone]

## 2021-12-02 NOTE — Progress Notes (Signed)
   HPI: Patient returns for routine postoperative follow-up having undergone aortic valve replacement using a 25 mm INSPIRIS pericardial valve and closure of a PFO on 10/02/2021. The patient's early postoperative recovery while in the hospital was notable for a slow postoperative course due to vasoplegia requiring midodrine and norepinephrine as well as recurrent postoperative atrial fibrillation/atrial flutter. Since hospital discharge the patient reports that she has been feeling well.  She is walking daily without chest pain or shortness of breath.  She has had no orthopnea or peripheral edema.  She is here today with her mother.   Current Outpatient Medications  Medication Sig Dispense Refill   amiodarone (PACERONE) 200 MG tablet Take 1 tablet (200 mg total) by mouth daily. 30 tablet 6   apixaban (ELIQUIS) 5 MG TABS tablet Take 1 tablet (5 mg total) by mouth 2 (two) times daily. 60 tablet 1   aspirin EC 81 MG EC tablet Take 1 tablet (81 mg total) by mouth daily. Swallow whole.     buPROPion (WELLBUTRIN SR) 150 MG 12 hr tablet Take 150 mg by mouth 2 (two) times daily.     digoxin (LANOXIN) 0.125 MG tablet Take 1 tablet (0.125 mg total) by mouth daily. 30 tablet 2   diphenhydrAMINE (BENADRYL) 25 MG tablet Take 25 mg by mouth at bedtime as needed for allergies.     levonorgestrel (LILETTA, 52 MG,) 19.5 MCG/DAY IUD IUD 1 Intra Uterine Device by Intrauterine route once.     midodrine (PROAMATINE) 10 MG tablet Take 1 tablet (10 mg total) by mouth 3 (three) times daily with meals. 90 tablet 1   Multiple Vitamin (MULTIVITAMIN) capsule Take 1 capsule by mouth daily.     No current facility-administered medications for this visit.    Physical Exam: BP 134/85 (BP Location: Right Arm, Patient Position: Sitting)   Pulse 95   Resp 20   Ht 5\' 7"  (1.702 m)   Wt 150 lb (68 kg)   SpO2 95% Comment: RA  BMI 23.49 kg/m  She looks well. Cardiac exam shows a regular rate and rhythm with normal heart  sounds.  There is no murmur. Lungs are clear. Chest incision is healing well and the sternum is stable. There is no peripheral edema.  Diagnostic Tests:  Narrative & Impression  CLINICAL DATA:  Status post AVR 4 weeks ago.   EXAM: CHEST - 2 VIEW   COMPARISON:  Chest x-ray 11/06/2021.   FINDINGS: Sternotomy wires and heart valve are again seen. The heart is enlarged, unchanged. There is no focal lung infiltrate, pleural effusion or pneumothorax. Calcified nodules in the right lung are unchanged, likely calcified granulomas. No acute fractures are seen.   IMPRESSION: Stable cardiomegaly.   No acute cardiopulmonary process.     Electronically Signed   By: 13/10/2021 M.D.   On: 12/02/2021 16:15    Impression:  She is doing well following her surgery.  She has no congestive heart failure symptoms.  I told her that she can return to driving at this time but should refrain lifting anything heavier than 10 pounds for 3 months postoperatively.  I decreased her midodrine to 5 mg 3 times daily.  Plan:  I will plan to see her back in 1 month for follow-up.   14/06/2021, MD [facility] [phone]

## 2021-12-16 ENCOUNTER — Encounter (HOSPITAL_COMMUNITY): Payer: Self-pay

## 2021-12-16 ENCOUNTER — Telehealth (HOSPITAL_COMMUNITY): Payer: Self-pay

## 2021-12-16 NOTE — Telephone Encounter (Signed)
Attempted to call patient in regards to Cardiac Rehab - LM on VM Mailed letter 

## 2021-12-16 NOTE — Telephone Encounter (Signed)
Patient called back and was interested in participating in the Cardiac Rehab Program. Patient will come in for orientation on 01/07/2022@10 :00am and will attend the 8:30am exercise class.   Pensions consultant.

## 2021-12-17 ENCOUNTER — Other Ambulatory Visit: Payer: Self-pay

## 2021-12-17 ENCOUNTER — Ambulatory Visit (HOSPITAL_COMMUNITY)
Admission: RE | Admit: 2021-12-17 | Discharge: 2021-12-17 | Disposition: A | Discharge status: Home / Self Care | Payer: No Typology Code available for payment source | Source: Ambulatory Visit | Attending: Internal Medicine | Admitting: Internal Medicine

## 2021-12-17 VITALS — BP 134/88 | HR 98 | Wt 150.6 lb

## 2021-12-17 DIAGNOSIS — I351 Nonrheumatic aortic (valve) insufficiency: Secondary | ICD-10-CM | POA: Diagnosis not present

## 2021-12-17 DIAGNOSIS — I5022 Chronic systolic (congestive) heart failure: Secondary | ICD-10-CM | POA: Diagnosis not present

## 2021-12-17 DIAGNOSIS — Z79899 Other long term (current) drug therapy: Secondary | ICD-10-CM | POA: Insufficient documentation

## 2021-12-17 DIAGNOSIS — K7460 Unspecified cirrhosis of liver: Secondary | ICD-10-CM | POA: Insufficient documentation

## 2021-12-17 DIAGNOSIS — Z7901 Long term (current) use of anticoagulants: Secondary | ICD-10-CM | POA: Diagnosis not present

## 2021-12-17 DIAGNOSIS — Z87891 Personal history of nicotine dependence: Secondary | ICD-10-CM | POA: Diagnosis not present

## 2021-12-17 DIAGNOSIS — Z953 Presence of xenogenic heart valve: Secondary | ICD-10-CM | POA: Insufficient documentation

## 2021-12-17 DIAGNOSIS — I97190 Other postprocedural cardiac functional disturbances following cardiac surgery: Secondary | ICD-10-CM | POA: Insufficient documentation

## 2021-12-17 DIAGNOSIS — I480 Paroxysmal atrial fibrillation: Secondary | ICD-10-CM | POA: Insufficient documentation

## 2021-12-17 DIAGNOSIS — R0902 Hypoxemia: Secondary | ICD-10-CM | POA: Insufficient documentation

## 2021-12-17 DIAGNOSIS — F1011 Alcohol abuse, in remission: Secondary | ICD-10-CM | POA: Diagnosis not present

## 2021-12-17 LAB — BASIC METABOLIC PANEL
Anion gap: 10 (ref 5–15)
BUN: 18 mg/dL (ref 6–20)
CO2: 24 mmol/L (ref 22–32)
Calcium: 9.5 mg/dL (ref 8.9–10.3)
Chloride: 103 mmol/L (ref 98–111)
Creatinine, Ser: 0.95 mg/dL (ref 0.44–1.00)
GFR, Estimated: >60 mL/min (ref >60)
Glucose, Bld: 104 mg/dL — ABNORMAL HIGH (ref 70–99)
Potassium: 4.7 mmol/L (ref 3.5–5.1)
Sodium: 137 mmol/L (ref 135–145)

## 2021-12-17 MED ORDER — MIDODRINE HCL 2.5 MG PO TABS: 2.5000 mg | ORAL_TABLET | Freq: Three times a day (TID) | ORAL | 3 refills | Status: DC

## 2021-12-17 MED ORDER — DIGOXIN 125 MCG PO TABS: 0.1250 mg | ORAL_TABLET | Freq: Every day | ORAL | 11 refills | Status: DC

## 2021-12-17 NOTE — Patient Instructions (Signed)
It was a pleasure seeing you today!  MEDICATIONS: -We are changing your medications today -Decrease midodrine to 2.5 mg three times daily. If BP remains less than 95 systolic or you get dizzy you can increase back to 5 mg three times daily. -Call if you have questions about your medications.  LABS: -We will call you if your labs need attention.  NEXT APPOINTMENT: Return to clinic in 3 weeks with APP Clinic.  In general, to take care of your heart failure: -Limit your fluid intake to 2 Liters (half-gallon) per day.   -Limit your salt intake to ideally 2-3 grams (2000-3000 mg) per day. -Weigh yourself daily and record, and bring that "weight diary" to your next appointment.  (Weight gain of 2-3 pounds in 1 day typically means fluid weight.) -The medications for your heart are to help your heart and help you live longer.   -Please contact us before stopping any of your heart medications.  Call the clinic at [phone] with questions or to reschedule future appointments.

## 2021-12-30 NOTE — Telephone Encounter (Signed)
Pt insurance is active and benefits verified through [employer] Co-pay 0, DED $3,000/0 met, out of pocket $6,000/0 met, co-insurance 30%. no pre-authorization required. Passport, 12/30/2021_0 Gentry Roch, REF# [id number]   2ndary insurance is active and benefits verified through [facility]. Co-pay $3.00, DED 0/0 met, out of pocket 0/0 met, co-insurance 0%. No pre-authorization required. Passport, 12/30/2021_1 :28am, REF# [id number]

## 2022-01-04 ENCOUNTER — Telehealth (HOSPITAL_COMMUNITY): Payer: Self-pay | Admitting: *Deleted

## 2022-01-04 NOTE — Progress Notes (Signed)
Advanced Heart Failure Clinic Note   PCP: [facility]  Jon Billings FNP  HF Cardiologist: Dr Gala Romney  HPI: Pamela Bryan is a 51 y.o. female with PMH of alcohol use disorder, tobacco use disorder and systolic HF due to NICM.   Admitted with 3/22 with acute systolic HF. EF 15-20% Cath with preserved cardiac output and severely reduced EF. MRI EF 11% concerning for possible myocarditis. Started on GDMT. Counseled stop drinking alcohol.   Echo 8/22 EF 20% severely dilated RV normal AoV functionally bicuspid mild AS at least moderate AI (reviewed by Dr. Gala Romney).   Losartan added at her follow up and she was scheduled for TEE to evaluate AV (? Bicuspid & severe AI) but cancelled as she did not have anyone to take her to procedure.  TEE (9/22) EF 20% with a severely dilated left ventricle and severe aortic insufficiency.  Right ventricular systolic function is moderately depressed.   RHC 11/22 showed well compensated filling pressures, moderately reduced CO 2/2 severe AI and LV dysfunction. She underwent aortic valve replacement with bioprosthetic valve 11/22. Hospitalization c/b post-op AF/ALF requiring IV amio and rapid pacing to NSR. Unfortunately went back into AFL, and started on Eliquis. Carvedilol, Farxiga, Lasix, losartan, spiro and ivabradine stopped at discharge, weight 145 lbs.  Today she returns for HF follow up. Just received news she is going to be a grandmother, to twins. Over-whelmed today. Starting CR this week. Has seen CVTS and cleared to go back to work soon. No longer with boyfriend. She is not SOB walking, going up stairs or with lifting. We have been weaning her midodrine and slowly adding back GDMT. Denies palpitations, CP, dizziness, edema, or PND/Orthopnea. Appetite ok. No fever or chills. Weight at home 145-150 pounds. Taking all medications. No ETOh or vaping.  Cardiac studies: - RHC (11/22):    RA = 5 RV = 27/6 PA = 27/12 (20) PCW = 15 Fick cardiac  output/index = 3.7/2.1 PVR = 1.4 WU Ao sat = 99% PA sat = 66%, 69%   Assessment: 1. Well compensate filling pressure with moderately reduced CO in setting of severe AI and significant LV dysfunction  - TEE (9/22): EF 20%, severely dilated LV and severe AI, RV moderately depressed.  - Echo (3/22): EF < 20% RV normal  - RHC/LHC 02/2021  Ao = 86/68 (77) LV = 92/25 RA =  4 RV = 36/10 PA = 36/17 (25) PCW = 20 Fick cardiac output/index = 5.4/3.0 PVR = 1.0 WU SVR = 1088 Ao sat = 98% PA sat = 74%, 78% High SVC sat = 83%   Assessment: 1. Normal coronary arteries 2. 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 Elevated filling pressures 4. Cardiac output higher than I expected. Suspect peripheral shunting in setting of probable cirrhosis  - cMRI (02/2021):Marland Kitchen Severely dilated left ventricle with EF 11%, diffuse hypokinesis.  2.  Mildly dilated right ventricle with EF 33%.  3. Abnormal aortic valve, looks functionally bicuspid. By regurgitant fraction, there appears to be severe aortic stenosis. Would consider TEE confirmation.  4. Delayed enhancement imaging shows inferoseptal RV insertion site LGE which tends to be nonspecific and related to volume/pressure overload. However, the lateral wall subepicardial LGE is concerning for possible prior myocarditis (less likely sarcoidosis). It is not in a CAD pattern.  ROS: All systems negative except as listed in HPI, PMH and Problem List.  SH:  Social History  Socioeconomic History   Marital status: Divorced    Spouse name: Not on file   Number of children: 4   Years of education: 63   Highest education level: Bachelor's degree (e.g., BA, AB, BS)  Occupational History   Not on file  Tobacco Use   Smoking status: Former    Packs/day: 1.00    Years: 30.00    Pack years: 30.00    Types: Cigarettes    Quit date: 11/02/2021    Years since quitting: 0.1   Smokeless tobacco: Never  Vaping Use   Vaping Use: Never used  Substance and Sexual Activity    Alcohol use: Yes    Comment: occasional   Drug use: No   Sexual activity: Not Currently  Other Topics Concern   Not on file  Social History Narrative   Not on file   Social Determinants of Health   Financial Resource Strain: Not on file  Food Insecurity: Not on file  Transportation Needs: Not on file  Physical Activity: Not on file  Stress: Not on file  Social Connections: Not on file  Intimate Partner Violence: Not on file   FH:  Family History  Problem Relation Age of Onset   Valvular heart disease Sister    Past Medical History:  Diagnosis Date   CHF (congestive heart failure) (HCC)    Coronary artery disease    History of hiatal hernia    Hypertension    Current Outpatient Medications  Medication Sig Dispense Refill   apixaban (ELIQUIS) 5 MG TABS tablet Take 1 tablet (5 mg total) by mouth 2 (two) times daily. 60 tablet 1   aspirin EC 81 MG EC tablet Take 1 tablet (81 mg total) by mouth daily. Swallow whole.     buPROPion (WELLBUTRIN SR) 150 MG 12 hr tablet Take 150 mg by mouth 2 (two) times daily.     dapagliflozin propanediol (FARXIGA) 10 MG TABS tablet Take 1 tablet (10 mg total) by mouth daily before breakfast. 30 tablet 11   digoxin (LANOXIN) 0.125 MG tablet Take 1 tablet (0.125 mg total) by mouth daily. 30 tablet 11   diphenhydrAMINE (BENADRYL) 25 MG tablet Take 25 mg by mouth at bedtime as needed for allergies.     levonorgestrel (LILETTA, 52 MG,) 19.5 MCG/DAY IUD IUD 1 Intra Uterine Device by Intrauterine route once.     Multiple Vitamin (MULTIVITAMIN) capsule Take 1 capsule by mouth daily.     Polyethyl Glycol-Propyl Glycol (LUBRICANT EYE DROPS) 0.4-0.3 % SOLN Place 1-2 drops into both eyes 3 (three) times daily as needed (dry/irritated eyes).     No current facility-administered medications for this encounter.   BP 102/60    Pulse 90    Wt 70.6 kg (155 lb 9.6 oz)    SpO2 94%    BMI 24.19 kg/m   Wt Readings from Last 3 Encounters:  01/08/22 70.6 kg (155 lb  9.6 oz)  01/07/22 70.4 kg (155 lb 3.3 oz)  01/06/22 70.3 kg (155 lb)   PHYSICAL EXAM: General:  NAD. No resp difficulty HEENT: Normal Neck: Supple. No JVD. Carotids 2+ bilat; no bruits. No lymphadenopathy or thryomegaly appreciated. Cor: PMI nondisplaced. Regular rate & rhythm. No rubs, gallops or murmurs. Lungs: Clear Abdomen: Soft, nontender, nondistended. No hepatosplenomegaly. No bruits or masses. Good bowel sounds. Extremities: No cyanosis, clubbing, rash, edema Neuro: Alert & oriented x 3, cranial nerves grossly intact. Moves all 4 extremities w/o difficulty. Tearful and mildly anxious today.  ECG (personally  reviewed): SR 90 bpm   ASSESSMENT & PLAN: 1. Chronic systolic CHF, Biventricular CHF - Echo (03/12/21): EF 15% severe RV dysfunction. - Cath (03/13/21): EF 15% No CAD. Elevated filling pressures. High cardiac output.  - cMRI 03/16/2021  EF < 20%, possible myocaritis.  - Possible ETOH CM. TSH normal, UDS negative. - Echo (07/30/21): EF 20% severely dilated RV normal AoV functionally bicuspid, mild AS at least moderate AI. - TEE (9/22): EF 20%, severely dilated LV, RV moderately depressed, severe AI. - RHC (11/22): Well-compensated filling pressures, mod reduced CO 2/2 severe AI and LV dysfunction - Stable NYHA 1-II, volume status looks good today. - Stop midodrine. - Restart Farxiga 10  mg daily. - Continue digoxin 0.125 mg daily. - Echo next appt. - BMET and dig level today; BMET in 10-14 days. - Continue with CR.  2. Paroxysmal AFL, post-op - Required IV amio and RAP. - Off ivabradine with AFL/AF. - NSR on ECG today.  - Stop amiodarone. - Continue Eliquis 5 mg bid.  3. Severe aortic insufficiency - S/p bioprosthetic AVR 11/22  4. H/o cirrhosis - Suggestive on U/S 3/22. - stop midodrine as above.  5. Alcohol Use disorder: - Congratulated on stopping.   6. Tobacco Use disorder: - No longer vaping.  7. Night Time Hypoxia - Sleep study negative for OSA.  -  Has night time hypoxia.  Follow up with Dr. Gala Romney + echo in 2 months as scheduled.  [facility] FNP 01/08/22 12:54 PM

## 2022-01-05 ENCOUNTER — Telehealth (HOSPITAL_COMMUNITY): Payer: Self-pay

## 2022-01-06 ENCOUNTER — Encounter: Payer: Self-pay | Admitting: Surgery

## 2022-01-06 ENCOUNTER — Other Ambulatory Visit: Payer: Self-pay

## 2022-01-06 ENCOUNTER — Ambulatory Visit (INDEPENDENT_AMBULATORY_CARE_PROVIDER_SITE_OTHER): Payer: Self-pay | Admitting: Surgery

## 2022-01-06 ENCOUNTER — Telehealth (HOSPITAL_COMMUNITY): Payer: Self-pay | Admitting: *Deleted

## 2022-01-06 VITALS — BP 104/71 | HR 101 | Resp 20 | Wt 155.0 lb

## 2022-01-06 DIAGNOSIS — I350 Nonrheumatic aortic (valve) stenosis: Secondary | ICD-10-CM

## 2022-01-06 DIAGNOSIS — Z736 Limitation of activities due to disability: Secondary | ICD-10-CM

## 2022-01-06 NOTE — Progress Notes (Signed)
° ° °  HPI:  Patient returns for routine postoperative follow-up having undergone aortic valve replacement using a 25 mm INSPIRIS pericardial valve and closure of a PFO on 10/02/2021. The patient's early postoperative recovery while in the hospital was notable for a slow postoperative course due to vasoplegia requiring midodrine and norepinephrine as well as recurrent postoperative atrial fibrillation/atrial flutter.  Since I last saw her on 12/02/2021 she has been feeling well and is walking daily without chest pain or shortness of breath.  She denies orthopnea and peripheral edema.  She saw cardiology and her midodrine was decreased to 2.5 mg 3 times daily.  Current Outpatient Medications  Medication Sig Dispense Refill   amiodarone (PACERONE) 200 MG tablet Take 1 tablet (200 mg total) by mouth daily. 30 tablet 6   apixaban (ELIQUIS) 5 MG TABS tablet Take 1 tablet (5 mg total) by mouth 2 (two) times daily. 60 tablet 1   aspirin EC 81 MG EC tablet Take 1 tablet (81 mg total) by mouth daily. Swallow whole.     buPROPion (WELLBUTRIN SR) 150 MG 12 hr tablet Take 150 mg by mouth 2 (two) times daily.     digoxin (LANOXIN) 0.125 MG tablet Take 1 tablet (0.125 mg total) by mouth daily. 30 tablet 11   diphenhydrAMINE (BENADRYL) 25 MG tablet Take 25 mg by mouth at bedtime as needed for allergies.     levonorgestrel (LILETTA, 52 MG,) 19.5 MCG/DAY IUD IUD 1 Intra Uterine Device by Intrauterine route once.     midodrine (PROAMATINE) 2.5 MG tablet Take 1 tablet (2.5 mg total) by mouth 3 (three) times daily with meals. 90 tablet 3   Multiple Vitamin (MULTIVITAMIN) capsule Take 1 capsule by mouth daily.     Polyethyl Glycol-Propyl Glycol (LUBRICANT EYE DROPS) 0.4-0.3 % SOLN Place 1-2 drops into both eyes 3 (three) times daily as needed (dry/irritated eyes).     No current facility-administered medications for this visit.     Physical Exam: BP 104/71 (BP Location: Right Arm, Patient Position: Sitting)     Pulse (!) 101    Resp 20    Wt 155 lb (70.3 kg)    SpO2 98%    BMI 24.28 kg/m  She looks well. Cardiac exam shows a regular rate and rhythm with normal heart sounds.  There is no murmur. Lungs are clear. The chest incision is healing well and the sternum is stable. There is no peripheral edema.  Diagnostic Tests:  None today  Impression:  Overall she is doing very well 3 months following aortic valve replacement.  She is scheduled to start cardiac rehab tomorrow.  She has a follow-up appoint with Dr. Gala Romney on 03/01/2022 and will have a follow-up echocardiogram at that time.  I told her she could return to normal activity and gave her a return to work note.  Plan:  She will continue to follow-up with cardiology and will contact me if she has any problems with her incision.   Alleen Borne, MD [facility] [phone]

## 2022-01-06 NOTE — Telephone Encounter (Signed)
Called pt to confirm her CR orientation appointment. I was not able to reach her but I was able to leave a message. Told her about proper shoes, mask,directions to the department and our contact number.I also ask for a return call.

## 2022-01-07 ENCOUNTER — Encounter (HOSPITAL_COMMUNITY): Payer: Self-pay

## 2022-01-07 ENCOUNTER — Encounter (HOSPITAL_COMMUNITY)
Admission: RE | Admit: 2022-01-07 | Discharge: 2022-01-07 | Disposition: A | Discharge status: Home / Self Care | Payer: No Typology Code available for payment source | Source: Ambulatory Visit | Attending: Internal Medicine | Admitting: Internal Medicine

## 2022-01-07 VITALS — BP 112/84 | HR 82 | Ht 67.25 in | Wt 155.2 lb

## 2022-01-07 DIAGNOSIS — Z952 Presence of prosthetic heart valve: Secondary | ICD-10-CM | POA: Insufficient documentation

## 2022-01-07 NOTE — Progress Notes (Signed)
Cardiac Individual Treatment Plan  Patient Details  Name: Pamela Bryan MRN: [id number] Date of Birth: 1971/12/18 Referring Provider:   Flowsheet Row CARDIAC REHAB PHASE II ORIENTATION from 01/07/2022 in [facility] CARDIAC REHAB  Referring Provider Arvilla Meres, MD       Initial Encounter Date:  Flowsheet Row CARDIAC REHAB PHASE II ORIENTATION from 01/07/2022 in [facility] CARDIAC REHAB  Date 01/07/22       Visit Diagnosis: 11/02/21 S/P AVR closure of PFO  Patient's Home Medications on Admission:  Current Outpatient Medications:    amiodarone (PACERONE) 200 MG tablet, Take 1 tablet (200 mg total) by mouth daily., Disp: 30 tablet, Rfl: 6   apixaban (ELIQUIS) 5 MG TABS tablet, Take 1 tablet (5 mg total) by mouth 2 (two) times daily., Disp: 60 tablet, Rfl: 1   aspirin EC 81 MG EC tablet, Take 1 tablet (81 mg total) by mouth daily. Swallow whole., Disp: , Rfl:    buPROPion (WELLBUTRIN SR) 150 MG 12 hr tablet, Take 150 mg by mouth 2 (two) times daily., Disp: , Rfl:    digoxin (LANOXIN) 0.125 MG tablet, Take 1 tablet (0.125 mg total) by mouth daily., Disp: 30 tablet, Rfl: 11   diphenhydrAMINE (BENADRYL) 25 MG tablet, Take 25 mg by mouth at bedtime as needed for allergies., Disp: , Rfl:    levonorgestrel (LILETTA, 52 MG,) 19.5 MCG/DAY IUD IUD, 1 Intra Uterine Device by Intrauterine route once., Disp: , Rfl:    midodrine (PROAMATINE) 2.5 MG tablet, Take 1 tablet (2.5 mg total) by mouth 3 (three) times daily with meals., Disp: 90 tablet, Rfl: 3   Multiple Vitamin (MULTIVITAMIN) capsule, Take 1 capsule by mouth daily., Disp: , Rfl:    Polyethyl Glycol-Propyl Glycol (LUBRICANT EYE DROPS) 0.4-0.3 % SOLN, Place 1-2 drops into both eyes 3 (three) times daily as needed (dry/irritated eyes)., Disp: , Rfl:   Past Medical History: Past Medical History:  Diagnosis Date   CHF (congestive heart failure) (HCC)    Coronary artery disease    History of hiatal  hernia    Hypertension     Tobacco Use: Social History   Tobacco Use  Smoking Status Former   Packs/day: 1.00   Years: 30.00   Pack years: 30.00   Types: Cigarettes   Quit date: 11/02/2021   Years since quitting: 0.1  Smokeless Tobacco Never    Labs: Recent Review Flowsheet Data     Labs for ITP Cardiac and Pulmonary Rehab Latest Ref Rng & Units 11/06/2021 11/07/2021 11/07/2021 11/07/2021 11/08/2021   Cholestrol 0 - 200 mg/dL - - - - -   LDLCALC 0 - 99 mg/dL - - - - -   HDL >64 mg/dL - - - - -   Trlycerides <150 mg/dL - - - - -   Hemoglobin A1c 4.8 - 5.6 % - - - - -   PHART 7.350 - 7.450 - - - - -   PCO2ART 32.0 - 48.0 mmHg - - - - -   HCO3 20.0 - 28.0 mmol/L - - - - -   TCO2 22 - 32 mmol/L - - - - -   ACIDBASEDEF 0.0 - 2.0 mmol/L - - - - -   O2SAT % 63.4 70.5 49.8 54.7 12.6       Capillary Blood Glucose: Lab Results  Component Value Date   GLUCAP 76 11/04/2021   GLUCAP 99 11/03/2021   GLUCAP 96 11/03/2021   GLUCAP 119 (H) 11/03/2021  GLUCAP 128 (H) 11/03/2021     Exercise Target Goals: Exercise Program Goal: Individual exercise prescription set using results from initial 6 min walk test and THRR while considering  patients activity barriers and safety.   Exercise Prescription Goal: Starting with aerobic activity 30 plus minutes a day, 3 days per week for initial exercise prescription. Provide home exercise prescription and guidelines that participant acknowledges understanding prior to discharge.  Activity Barriers & Risk Stratification:  Activity Barriers & Cardiac Risk Stratification - 01/07/22 1256       Activity Barriers & Cardiac Risk Stratification   Activity Barriers Joint Problems;Decreased Ventricular Function    Cardiac Risk Stratification High             6 Minute Walk:  6 Minute Walk     Row Name 01/07/22 1113         6 Minute Walk   Phase Initial     Distance 1783 feet     Walk Time 6 minutes     # of Rest Breaks 0      MPH 3.38     METS 5.14     RPE 0     Perceived Dyspnea  0     VO2 Peak 17.97     Symptoms No     Resting HR 88 bpm     Resting BP 112/84     Resting Oxygen Saturation  95 %     Exercise Oxygen Saturation  during 6 min walk 99 %     Max Ex. HR 98 bpm     Max Ex. BP 140/82     2 Minute Post BP 130/78              Oxygen Initial Assessment:   Oxygen Re-Evaluation:   Oxygen Discharge (Final Oxygen Re-Evaluation):   Initial Exercise Prescription:  Initial Exercise Prescription - 01/07/22 1300       Date of Initial Exercise RX and Referring Provider   Date 01/07/22    Referring Provider Glori Bickers, MD    Expected Discharge Date 03/05/22      Recumbant Bike   Level 2    RPM 60    Minutes 15    METs 3      NuStep   Level 3    SPM 75    Minutes 15    METs 2.5      Prescription Details   Frequency (times per week) 3    Duration Progress to 30 minutes of continuous aerobic without signs/symptoms of physical distress      Intensity   THRR 40-80% of Max Heartrate 68-137    Ratings of Perceived Exertion 11-13    Perceived Dyspnea 0-4      Progression   Progression Continue progressive overload as per policy without signs/symptoms or physical distress.      Resistance Training   Training Prescription Yes    Weight 3 lbs    Reps 10-15             Perform Capillary Blood Glucose checks as needed.  Exercise Prescription Changes:   Exercise Comments:   Exercise Goals and Review:   Exercise Goals     Row Name 01/07/22 1257             Exercise Goals   Increase Physical Activity Yes       Intervention Provide advice, education, support and counseling about physical activity/exercise needs.;Develop an individualized exercise prescription for aerobic and resistive training  based on initial evaluation findings, risk stratification, comorbidities and participant's personal goals.       Expected Outcomes Long Term: Exercising regularly at  least 3-5 days a week.;Long Term: Add in home exercise to make exercise part of routine and to increase amount of physical activity.;Short Term: Attend rehab on a regular basis to increase amount of physical activity.       Increase Strength and Stamina Yes       Intervention Provide advice, education, support and counseling about physical activity/exercise needs.;Develop an individualized exercise prescription for aerobic and resistive training based on initial evaluation findings, risk stratification, comorbidities and participant's personal goals.       Expected Outcomes Long Term: Improve cardiorespiratory fitness, muscular endurance and strength as measured by increased METs and functional capacity (6MWT);Short Term: Perform resistance training exercises routinely during rehab and add in resistance training at home;Short Term: Increase workloads from initial exercise prescription for resistance, speed, and METs.       Able to understand and use rate of perceived exertion (RPE) scale Yes       Intervention Provide education and explanation on how to use RPE scale       Expected Outcomes Short Term: Able to use RPE daily in rehab to express subjective intensity level;Long Term:  Able to use RPE to guide intensity level when exercising independently       Knowledge and understanding of Target Heart Rate Range (THRR) Yes       Intervention Provide education and explanation of THRR including how the numbers were predicted and where they are located for reference       Expected Outcomes Short Term: Able to state/look up THRR;Long Term: Able to use THRR to govern intensity when exercising independently;Short Term: Able to use daily as guideline for intensity in rehab       Understanding of Exercise Prescription Yes       Intervention Provide education, explanation, and written materials on patient's individual exercise prescription       Expected Outcomes Short Term: Able to explain program exercise  prescription;Long Term: Able to explain home exercise prescription to exercise independently                Exercise Goals Re-Evaluation :    Discharge Exercise Prescription (Final Exercise Prescription Changes):   Nutrition:  Target Goals: Understanding of nutrition guidelines, daily intake of sodium 1500mg , cholesterol 200mg , calories 30% from fat and 7% or less from saturated fats, daily to have 5 or more servings of fruits and vegetables.  Biometrics:  Pre Biometrics - 01/07/22 1015       Pre Biometrics   Waist Circumference 35.5 inches    Hip Circumference 39.75 inches    Waist to Hip Ratio 0.89 %    Triceps Skinfold 12 mm    % Body Fat 31.1 %    Grip Strength 27 kg    Flexibility 14.5 in    Single Leg Stand 30 seconds              Nutrition Therapy Plan and Nutrition Goals:   Nutrition Assessments:  MEDIFICTS Score Key: ?70 Need to make dietary changes  40-70 Heart Healthy Diet ? 40 Therapeutic Level Cholesterol Diet   Picture Your Plate Scores: D34-534 Unhealthy dietary pattern with much room for improvement. 41-50 Dietary pattern unlikely to meet recommendations for good health and room for improvement. 51-60 More healthful dietary pattern, with some room for improvement.  >60 Healthy dietary pattern, although  there may be some specific behaviors that could be improved.    Nutrition Goals Re-Evaluation:   Nutrition Goals Discharge (Final Nutrition Goals Re-Evaluation):   Psychosocial: Target Goals: Acknowledge presence or absence of significant depression and/or stress, maximize coping skills, provide positive support system. Participant is able to verbalize types and ability to use techniques and skills needed for reducing stress and depression.  Initial Review & Psychosocial Screening:  Initial Psych Review & Screening - 01/07/22 1451       Initial Review   Current issues with None Identified      Family Dynamics   Good Support System?  Yes   Pamela Bryan has her parents and her children for support     Barriers   Psychosocial barriers to participate in program There are no identifiable barriers or psychosocial needs.      Screening Interventions   Interventions Encouraged to exercise             Quality of Life Scores:  Quality of Life - 01/07/22 1150       Quality of Life   Select Quality of Life      Quality of Life Scores   Health/Function Pre 24.75 %    Socioeconomic Pre 25.29 %    Psych/Spiritual Pre 24 %    Family Pre 28.5 %    GLOBAL Pre 25.17 %            Scores of 19 and below usually indicate a poorer quality of life in these areas.  A difference of  2-3 points is a clinically meaningful difference.  A difference of 2-3 points in the total score of the Quality of Life Index has been associated with significant improvement in overall quality of life, self-image, physical symptoms, and general health in studies assessing change in quality of life.  PHQ-9: Recent Review Flowsheet Data     Depression screen [facility] 2/9 01/07/2022   Decreased Interest 0   Down, Depressed, Hopeless 0   PHQ - 2 Score 0      Interpretation of Total Score  Total Score Depression Severity:  1-4 = Minimal depression, 5-9 = Mild depression, 10-14 = Moderate depression, 15-19 = Moderately severe depression, 20-27 = Severe depression   Psychosocial Evaluation and Intervention:   Psychosocial Re-Evaluation:   Psychosocial Discharge (Final Psychosocial Re-Evaluation):   Vocational Rehabilitation: Provide vocational rehab assistance to qualifying candidates.   Vocational Rehab Evaluation & Intervention:  Vocational Rehab - 01/07/22 1452       Initial Vocational Rehab Evaluation & Intervention   Assessment shows need for Vocational Rehabilitation No   Ankitha works full time and does not need vocational rehab at this time.            Education: Education Goals: Education classes will be provided on a weekly  basis, covering required topics. Participant will state understanding/return demonstration of topics presented.  Learning Barriers/Preferences:  Learning Barriers/Preferences - 01/07/22 1305       Learning Barriers/Preferences   Learning Barriers Sight   wears reading glasses   Learning Preferences Written Material;Computer/Internet;Pictoral;Video             Education Topics: Hypertension, Hypertension Reduction -Define heart disease and high blood pressure. Discus how high blood pressure affects the body and ways to reduce high blood pressure.   Exercise and Your Heart -Discuss why it is important to exercise, the FITT principles of exercise, normal and abnormal responses to exercise, and how to exercise safely.   Angina -Discuss  definition of angina, causes of angina, treatment of angina, and how to decrease risk of having angina.   Cardiac Medications -Review what the following cardiac medications are used for, how they affect the body, and side effects that may occur when taking the medications.  Medications include Aspirin, Beta blockers, calcium channel blockers, ACE Inhibitors, angiotensin receptor blockers, diuretics, digoxin, and antihyperlipidemics.   Congestive Heart Failure -Discuss the definition of CHF, how to live with CHF, the signs and symptoms of CHF, and how keep track of weight and sodium intake.   Heart Disease and Intimacy -Discus the effect sexual activity has on the heart, how changes occur during intimacy as we age, and safety during sexual activity.   Smoking Cessation / COPD -Discuss different methods to quit smoking, the health benefits of quitting smoking, and the definition of COPD.   Nutrition I: Fats -Discuss the types of cholesterol, what cholesterol does to the heart, and how cholesterol levels can be controlled.   Nutrition II: Labels -Discuss the different components of food labels and how to read food label   Heart Parts/Heart  Disease and PAD -Discuss the anatomy of the heart, the pathway of blood circulation through the heart, and these are affected by heart disease.   Stress I: Signs and Symptoms -Discuss the causes of stress, how stress may lead to anxiety and depression, and ways to limit stress.   Stress II: Relaxation -Discuss different types of relaxation techniques to limit stress.   Warning Signs of Stroke / TIA -Discuss definition of a stroke, what the signs and symptoms are of a stroke, and how to identify when someone is having stroke.   Knowledge Questionnaire Score:  Knowledge Questionnaire Score - 01/07/22 1151       Knowledge Questionnaire Score   Pre Score 26/28             Core Components/Risk Factors/Patient Goals at Admission:  Personal Goals and Risk Factors at Admission - 01/07/22 1312       Core Components/Risk Factors/Patient Goals on Admission    Weight Management Weight Maintenance    Tobacco Cessation Yes    Number of packs per day 0    Intervention Assist the participant in steps to quit. Provide individualized education and counseling about committing to Tobacco Cessation, relapse prevention, and pharmacological support that can be provided by physician.;Advice worker, assist with locating and accessing local/national Quit Smoking programs, and support quit date choice.    Expected Outcomes Short Term: Will demonstrate readiness to quit, by selecting a quit date.;Short Term: Will quit all tobacco product use, adhering to prevention of relapse plan.;Long Term: Complete abstinence from all tobacco products for at least 12 months from quit date.    Heart Failure Yes    Intervention Provide a combined exercise and nutrition program that is supplemented with education, support and counseling about heart failure. Directed toward relieving symptoms such as shortness of breath, decreased exercise tolerance, and extremity edema.    Expected Outcomes Improve  functional capacity of life;Short term: Attendance in program 2-3 days a week with increased exercise capacity. Reported lower sodium intake. Reported increased fruit and vegetable intake. Reports medication compliance.;Short term: Daily weights obtained and reported for increase. Utilizing diuretic protocols set by physician.;Long term: Adoption of self-care skills and reduction of barriers for early signs and symptoms recognition and intervention leading to self-care maintenance.             Core Components/Risk Factors/Patient Goals Review:  Core Components/Risk Factors/Patient Goals at Discharge (Final Review):    ITP Comments:  ITP Comments     Row Name 01/07/22 1120           ITP Comments Dr Fransico Him MD, Medical Director                Comments:Jaidin attended orientation on 01/07/2022 to review rules and guidelines for program.  Completed 6 minute walk test, Intitial ITP, and exercise prescription.  VSS. Telemetry-Sinus Rhythm.  Asymptomatic. Safety measures and social distancing in place per CDC guidelines.Barnet Pall, RN,BSN 01/07/2022 3:00 PM

## 2022-01-07 NOTE — Progress Notes (Signed)
Cardiac Rehab Medication Review by a Nurse  Does the patient  feel that his/her medications are working for him/her?  YES   Has the patient been experiencing any side effects to the medications prescribed? NO  Does the patient measure his/her own blood pressure or blood glucose at home?  YES   Does the patient have any problems obtaining medications due to transportation or finances?    NO  Understanding of regimen: good Understanding of indications: good Potential of compliance: good    Nurse comments: Pamela Bryan is taking her medications as prescribed and has a good understanding of what her medications are for.    Arta Bruce [facility] RN 01/07/2022 2:36 PM

## 2022-01-08 ENCOUNTER — Ambulatory Visit (HOSPITAL_COMMUNITY)
Admission: RE | Admit: 2022-01-08 | Discharge: 2022-01-08 | Disposition: A | Discharge status: Home / Self Care | Payer: No Typology Code available for payment source | Source: Ambulatory Visit | Attending: Family Medicine | Admitting: Family Medicine

## 2022-01-08 ENCOUNTER — Other Ambulatory Visit (HOSPITAL_COMMUNITY): Payer: Self-pay | Admitting: Family Medicine

## 2022-01-08 ENCOUNTER — Encounter (HOSPITAL_COMMUNITY): Payer: Self-pay

## 2022-01-08 ENCOUNTER — Other Ambulatory Visit: Payer: Self-pay

## 2022-01-08 VITALS — BP 102/60 | HR 90 | Wt 155.6 lb

## 2022-01-08 DIAGNOSIS — G4734 Idiopathic sleep related nonobstructive alveolar hypoventilation: Secondary | ICD-10-CM

## 2022-01-08 DIAGNOSIS — Z79899 Other long term (current) drug therapy: Secondary | ICD-10-CM | POA: Diagnosis not present

## 2022-01-08 DIAGNOSIS — F1011 Alcohol abuse, in remission: Secondary | ICD-10-CM

## 2022-01-08 DIAGNOSIS — R0902 Hypoxemia: Secondary | ICD-10-CM | POA: Diagnosis not present

## 2022-01-08 DIAGNOSIS — Z7901 Long term (current) use of anticoagulants: Secondary | ICD-10-CM | POA: Diagnosis not present

## 2022-01-08 DIAGNOSIS — I351 Nonrheumatic aortic (valve) insufficiency: Secondary | ICD-10-CM | POA: Diagnosis not present

## 2022-01-08 DIAGNOSIS — Z7984 Long term (current) use of oral hypoglycemic drugs: Secondary | ICD-10-CM | POA: Diagnosis not present

## 2022-01-08 DIAGNOSIS — K7460 Unspecified cirrhosis of liver: Secondary | ICD-10-CM | POA: Insufficient documentation

## 2022-01-08 DIAGNOSIS — I480 Paroxysmal atrial fibrillation: Secondary | ICD-10-CM | POA: Insufficient documentation

## 2022-01-08 DIAGNOSIS — Z952 Presence of prosthetic heart valve: Secondary | ICD-10-CM | POA: Diagnosis not present

## 2022-01-08 DIAGNOSIS — Z953 Presence of xenogenic heart valve: Secondary | ICD-10-CM | POA: Diagnosis not present

## 2022-01-08 DIAGNOSIS — I110 Hypertensive heart disease with heart failure: Secondary | ICD-10-CM | POA: Insufficient documentation

## 2022-01-08 DIAGNOSIS — Q231 Congenital insufficiency of aortic valve: Secondary | ICD-10-CM | POA: Insufficient documentation

## 2022-01-08 DIAGNOSIS — I5022 Chronic systolic (congestive) heart failure: Secondary | ICD-10-CM | POA: Diagnosis not present

## 2022-01-08 DIAGNOSIS — I5082 Biventricular heart failure: Secondary | ICD-10-CM | POA: Insufficient documentation

## 2022-01-08 DIAGNOSIS — Z87891 Personal history of nicotine dependence: Secondary | ICD-10-CM

## 2022-01-08 DIAGNOSIS — F1091 Alcohol use, unspecified, in remission: Secondary | ICD-10-CM | POA: Insufficient documentation

## 2022-01-08 DIAGNOSIS — I428 Other cardiomyopathies: Secondary | ICD-10-CM | POA: Insufficient documentation

## 2022-01-08 DIAGNOSIS — K7469 Other cirrhosis of liver: Secondary | ICD-10-CM

## 2022-01-08 LAB — BASIC METABOLIC PANEL
Anion gap: 12 (ref 5–15)
BUN: 17 mg/dL (ref 6–20)
CO2: 23 mmol/L (ref 22–32)
Calcium: 9.9 mg/dL (ref 8.9–10.3)
Chloride: 104 mmol/L (ref 98–111)
Creatinine, Ser: 0.91 mg/dL (ref 0.44–1.00)
GFR, Estimated: >60 mL/min (ref >60)
Glucose, Bld: 66 mg/dL — ABNORMAL LOW (ref 70–99)
Potassium: 4.5 mmol/L (ref 3.5–5.1)
Sodium: 139 mmol/L (ref 135–145)

## 2022-01-08 LAB — DIGOXIN LEVEL: Digoxin Level: 1 ng/mL (ref 0.8–2.0)

## 2022-01-08 MED ORDER — DAPAGLIFLOZIN PROPANEDIOL 10 MG PO TABS: 10.0000 mg | ORAL_TABLET | Freq: Every day | ORAL | 11 refills | Status: DC

## 2022-01-08 NOTE — Patient Instructions (Signed)
STOP Midodrine STOP Amiodarone RESTART Farxiga 10 mg one tab daily  Labs today We will only contact you if something comes back abnormal or we need to make some changes. Otherwise no news is good news!  Labs needed in 10-14 days  Keep follow up as scheduled with Dr Gala Romney and echo  Do the following things EVERYDAY: Weigh yourself in the morning before breakfast. Write it down and keep it in a log. Take your medicines as prescribed Eat low salt foods--Limit salt (sodium) to 2000 mg per day.  Stay as active as you can everyday Limit all fluids for the day to less than 2 liters  At the [facility], you and your health needs are our priority. As part of our continuing mission to provide you with exceptional heart care, we have created designated Provider Care Teams. These Care Teams include your primary Cardiologist (physician) and Advanced Practice Providers (APPs- Physician Assistants and Nurse Practitioners) who all work together to provide you with the care you need, when you need it.   You may see any of the following providers on your designated Care Team at your next follow up: Dr Arvilla Meres Dr Carron Curie, NP Robbie Lis, [place] [facility], [place] Karle Plumber, PharmD   Please be sure to bring in all your medications bottles to every appointment.   If you have any questions or concerns before your next appointment please send [place] a message through [place] or call our office at [phone].    TO LEAVE A MESSAGE FOR THE NURSE SELECT OPTION 2, PLEASE LEAVE A MESSAGE INCLUDING: YOUR NAME DATE OF BIRTH CALL BACK NUMBER REASON FOR CALL**this is important as we prioritize the call backs  YOU WILL RECEIVE A CALL BACK THE SAME DAY AS LONG AS YOU CALL BEFORE 4:00 PM

## 2022-01-11 ENCOUNTER — Encounter (HOSPITAL_COMMUNITY)
Admission: RE | Admit: 2022-01-11 | Discharge: 2022-01-11 | Disposition: A | Discharge status: Home / Self Care | Payer: No Typology Code available for payment source | Source: Ambulatory Visit | Attending: Internal Medicine | Admitting: Internal Medicine

## 2022-01-11 ENCOUNTER — Other Ambulatory Visit: Payer: Self-pay

## 2022-01-11 DIAGNOSIS — Z952 Presence of prosthetic heart valve: Secondary | ICD-10-CM | POA: Diagnosis present

## 2022-01-11 NOTE — Progress Notes (Signed)
Daily Session Note  Patient Details  Name: Pamela Bryan MRN: [id number] Date of Birth: Mar 07, 1971 Referring Provider:   Flowsheet Row CARDIAC REHAB PHASE II ORIENTATION from 01/07/2022 in [place]  Referring Provider Glori Bickers, MD       Encounter Date: 01/11/2022  Check In:  Session Check In - 01/11/22 0841       Check-In   Supervising physician immediately available to respond to emergencies [facility] immediately available    Physician(s) Dr. Tana Coast    Location [facility]    Staff Present Barnet Pall, RN, Milus Glazier, MS, ACSM-CEP, CCRP, Exercise Physiologist;Olinty Celesta Aver, MS, ACSM CEP, Exercise Physiologist;Kaylee Rosana Hoes, MS, ACSM-CEP, Exercise Physiologist    Virtual Visit No    Medication changes reported     Yes    Comments Amiodarone and Midodrine discontinued.    Fall or balance concerns reported    No    Tobacco Cessation No Change    Current number of cigarettes/nicotine per day     0    Warm-up and Cool-down Performed as group-led instruction    Resistance Training Performed Yes    VAD Patient? No    PAD/SET Patient? No      Pain Assessment   Currently in Pain? No/denies    Pain Score 0-No pain    Multiple Pain Sites No             Capillary Blood Glucose: No results found for this or any previous visit (from the past 24 hour(s)).   Exercise Prescription Changes - 01/11/22 1200       Response to Exercise   Blood Pressure (Admit) 108/78    Blood Pressure (Exercise) 124/82    Blood Pressure (Exit) 110/70    Heart Rate (Admit) 90 bpm    Heart Rate (Exercise) 121 bpm    Heart Rate (Exit) 90 bpm    Rating of Perceived Exertion (Exercise) 11    Symptoms None    Comments Pt's first day in the CRP2 program    Duration Progress to 30 minutes of  aerobic without signs/symptoms of physical distress    Intensity THRR unchanged      Progression   Progression Continue to progress  workloads to maintain intensity without signs/symptoms of physical distress.    Average METs 3      Resistance Training   Training Prescription Yes    Weight 3 lbs    Reps 10-15    Time 10 Minutes      Interval Training   Interval Training No      Recumbant Bike   Level 2.5    RPM 60    Minutes 15    METs 3.4      NuStep   Level 2    SPM 75    Minutes 15    METs 2.6             Social History   Tobacco Use  Smoking Status Former   Packs/day: 1.00   Years: 30.00   Pack years: 30.00   Types: Cigarettes   Quit date: 11/02/2021   Years since quitting: 0.1  Smokeless Tobacco Never    Goals Met:  Exercise tolerated well No report of concerns or symptoms today Strength training completed today  Goals Unmet:  Not Applicable  Comments: [name] started cardiac rehab today.  Pt tolerated light exercise without difficulty. VSS, telemetry-Sinus Rhythm, asymptomatic.  Medication list reconciled. Pt denies barriers to [employer]  compliance.  PSYCHOSOCIAL ASSESSMENT:  PHQ-0. Pt exhibits positive coping skills, hopeful outlook with supportive family. No psychosocial needs identified at this time, no psychosocial interventions necessary.    Pt enjoys watching HGTV. Spending time with her 4 kids, and fishing .   Pt oriented to exercise equipment and routine.    Understanding verbalized.Barnet Pall, RN,BSN 01/11/2022 3:06 PM    Dr. Fransico Him is Medical Director for Cardiac Rehab at [facility].

## 2022-01-11 NOTE — Progress Notes (Signed)
Cardiac Individual Treatment Plan  Patient Details  Name: Pamela Bryan MRN: [id number] Date of Birth: 05-Feb-1971 Referring Provider:   Flowsheet Row CARDIAC REHAB PHASE II ORIENTATION from 01/07/2022 in [facility] CARDIAC REHAB  Referring Provider Arvilla Meres, MD       Initial Encounter Date:  Flowsheet Row CARDIAC REHAB PHASE II ORIENTATION from 01/07/2022 in [facility] CARDIAC REHAB  Date 01/07/22       Visit Diagnosis: 11/02/21 S/P AVR closure of PFO  Patient's Home Medications on Admission:  Current Outpatient Medications:    apixaban (ELIQUIS) 5 MG TABS tablet, Take 1 tablet (5 mg total) by mouth 2 (two) times daily., Disp: 60 tablet, Rfl: 1   aspirin EC 81 MG EC tablet, Take 1 tablet (81 mg total) by mouth daily. Swallow whole., Disp: , Rfl:    buPROPion (WELLBUTRIN SR) 150 MG 12 hr tablet, Take 150 mg by mouth 2 (two) times daily., Disp: , Rfl:    dapagliflozin propanediol (FARXIGA) 10 MG TABS tablet, Take 1 tablet (10 mg total) by mouth daily before breakfast., Disp: 30 tablet, Rfl: 11   diphenhydrAMINE (BENADRYL) 25 MG tablet, Take 25 mg by mouth at bedtime as needed for allergies., Disp: , Rfl:    levonorgestrel (LILETTA, 52 MG,) 19.5 MCG/DAY IUD IUD, 1 Intra Uterine Device by Intrauterine route once., Disp: , Rfl:    Multiple Vitamin (MULTIVITAMIN) capsule, Take 1 capsule by mouth daily., Disp: , Rfl:    Polyethyl Glycol-Propyl Glycol (LUBRICANT EYE DROPS) 0.4-0.3 % SOLN, Place 1-2 drops into both eyes 3 (three) times daily as needed (dry/irritated eyes)., Disp: , Rfl:   Past Medical History: Past Medical History:  Diagnosis Date   CHF (congestive heart failure) (HCC)    Coronary artery disease    History of hiatal hernia    Hypertension     Tobacco Use: Social History   Tobacco Use  Smoking Status Former   Packs/day: 1.00   Years: 30.00   Pack years: 30.00   Types: Cigarettes   Quit date: 11/02/2021   Years since  quitting: 0.1  Smokeless Tobacco Never    Labs: Recent Review Flowsheet Data     Labs for ITP Cardiac and Pulmonary Rehab Latest Ref Rng & Units 11/06/2021 11/07/2021 11/07/2021 11/07/2021 11/08/2021   Cholestrol 0 - 200 mg/dL - - - - -   LDLCALC 0 - 99 mg/dL - - - - -   HDL >40 mg/dL - - - - -   Trlycerides <150 mg/dL - - - - -   Hemoglobin A1c 4.8 - 5.6 % - - - - -   PHART 7.350 - 7.450 - - - - -   PCO2ART 32.0 - 48.0 mmHg - - - - -   HCO3 20.0 - 28.0 mmol/L - - - - -   TCO2 22 - 32 mmol/L - - - - -   ACIDBASEDEF 0.0 - 2.0 mmol/L - - - - -   O2SAT % 63.4 70.5 49.8 54.7 12.6       Capillary Blood Glucose: Lab Results  Component Value Date   GLUCAP 76 11/04/2021   GLUCAP 99 11/03/2021   GLUCAP 96 11/03/2021   GLUCAP 119 (H) 11/03/2021   GLUCAP 128 (H) 11/03/2021     Exercise Target Goals: Exercise Program Goal: Individual exercise prescription set using results from initial 6 min walk test and THRR while considering  patients activity barriers and safety.   Exercise Prescription Goal: Starting with  aerobic activity 30 plus minutes a day, 3 days per week for initial exercise prescription. Provide home exercise prescription and guidelines that participant acknowledges understanding prior to discharge.  Activity Barriers & Risk Stratification:  Activity Barriers & Cardiac Risk Stratification - 01/07/22 1256       Activity Barriers & Cardiac Risk Stratification   Activity Barriers Joint Problems;Decreased Ventricular Function    Cardiac Risk Stratification High             6 Minute Walk:  6 Minute Walk     Row Name 01/07/22 1113         6 Minute Walk   Phase Initial     Distance 1783 feet     Walk Time 6 minutes     # of Rest Breaks 0     MPH 3.38     METS 5.14     RPE 0     Perceived Dyspnea  0     VO2 Peak 17.97     Symptoms No     Resting HR 88 bpm     Resting BP 112/84     Resting Oxygen Saturation  95 %     Exercise Oxygen Saturation   during 6 min walk 99 %     Max Ex. HR 98 bpm     Max Ex. BP 140/82     2 Minute Post BP 130/78              Oxygen Initial Assessment:   Oxygen Re-Evaluation:   Oxygen Discharge (Final Oxygen Re-Evaluation):   Initial Exercise Prescription:  Initial Exercise Prescription - 01/07/22 1300       Date of Initial Exercise RX and Referring Provider   Date 01/07/22    Referring Provider Arvilla Meres, MD    Expected Discharge Date 03/05/22      Recumbant Bike   Level 2    RPM 60    Minutes 15    METs 3      NuStep   Level 3    SPM 75    Minutes 15    METs 2.5      Prescription Details   Frequency (times per week) 3    Duration Progress to 30 minutes of continuous aerobic without signs/symptoms of physical distress      Intensity   THRR 40-80% of Max Heartrate 68-137    Ratings of Perceived Exertion 11-13    Perceived Dyspnea 0-4      Progression   Progression Continue progressive overload as per policy without signs/symptoms or physical distress.      Resistance Training   Training Prescription Yes    Weight 3 lbs    Reps 10-15             Perform Capillary Blood Glucose checks as needed.  Exercise Prescription Changes:   Exercise Prescription Changes     Row Name 01/11/22 1200             Response to Exercise   Blood Pressure (Admit) 108/78       Blood Pressure (Exercise) 124/82       Blood Pressure (Exit) 110/70       Heart Rate (Admit) 90 bpm       Heart Rate (Exercise) 121 bpm       Heart Rate (Exit) 90 bpm       Rating of Perceived Exertion (Exercise) 11       Symptoms None  Comments Pt's first day in the CRP2 program       Duration Progress to 30 minutes of  aerobic without signs/symptoms of physical distress       Intensity THRR unchanged         Progression   Progression Continue to progress workloads to maintain intensity without signs/symptoms of physical distress.       Average METs 3         Resistance Training    Training Prescription Yes       Weight 3 lbs       Reps 10-15       Time 10 Minutes         Interval Training   Interval Training No         Recumbant Bike   Level 2.5       RPM 60       Minutes 15       METs 3.4         NuStep   Level 2       SPM 75       Minutes 15       METs 2.6                Exercise Comments:   Exercise Comments     Row Name 01/11/22 1203           Exercise Comments Pt's first day in the CRP2 program. Pt did well with vitals WNL. Workload on nustep set back as pt voiced that she had a poor night sleep and was tired today. Pt voiced at the end of the session that she "felt better than when she came in."                Exercise Goals and Review:   Exercise Goals     Row Name 01/07/22 1257             Exercise Goals   Increase Physical Activity Yes       Intervention Provide advice, education, support and counseling about physical activity/exercise needs.;Develop an individualized exercise prescription for aerobic and resistive training based on initial evaluation findings, risk stratification, comorbidities and participant's personal goals.       Expected Outcomes Long Term: Exercising regularly at least 3-5 days a week.;Long Term: Add in home exercise to make exercise part of routine and to increase amount of physical activity.;Short Term: Attend rehab on a regular basis to increase amount of physical activity.       Increase Strength and Stamina Yes       Intervention Provide advice, education, support and counseling about physical activity/exercise needs.;Develop an individualized exercise prescription for aerobic and resistive training based on initial evaluation findings, risk stratification, comorbidities and participant's personal goals.       Expected Outcomes Long Term: Improve cardiorespiratory fitness, muscular endurance and strength as measured by increased METs and functional capacity ( );Short Term: Perform resistance  training exercises routinely during rehab and add in resistance training at home;Short Term: Increase workloads from initial exercise prescription for resistance, speed, and METs.       Able to understand and use rate of perceived exertion (RPE) scale Yes       Intervention Provide education and explanation on how to use RPE scale       Expected Outcomes Short Term: Able to use RPE daily in rehab to express subjective intensity level;Long Term:  Able to use RPE to guide intensity level when  exercising independently       Knowledge and understanding of Target Heart Rate Range (THRR) Yes       Intervention Provide education and explanation of THRR including how the numbers were predicted and where they are located for reference       Expected Outcomes Short Term: Able to state/look up THRR;Long Term: Able to use THRR to govern intensity when exercising independently;Short Term: Able to use daily as guideline for intensity in rehab       Understanding of Exercise Prescription Yes       Intervention Provide education, explanation, and written materials on patient's individual exercise prescription       Expected Outcomes Short Term: Able to explain program exercise prescription;Long Term: Able to explain home exercise prescription to exercise independently                Exercise Goals Re-Evaluation :  Exercise Goals Re-Evaluation     Row Name 01/11/22 1202             Exercise Goal Re-Evaluation   Exercise Goals Review Increase Physical Activity;Increase Strength and Stamina;Able to understand and use rate of perceived exertion (RPE) scale;Knowledge and understanding of Target Heart Rate Range (THRR);Understanding of Exercise Prescription       Comments Pt's first day in the CRP2 program. Pt understands the exercise Rx, THRR and RPE scale.       Expected Outcomes Will continue to monitor patient and progress exercise workloads as tolerated.                 Discharge Exercise  Prescription (Final Exercise Prescription Changes):  Exercise Prescription Changes - 01/11/22 1200       Response to Exercise   Blood Pressure (Admit) 108/78    Blood Pressure (Exercise) 124/82    Blood Pressure (Exit) 110/70    Heart Rate (Admit) 90 bpm    Heart Rate (Exercise) 121 bpm    Heart Rate (Exit) 90 bpm    Rating of Perceived Exertion (Exercise) 11    Symptoms None    Comments Pt's first day in the CRP2 program    Duration Progress to 30 minutes of  aerobic without signs/symptoms of physical distress    Intensity THRR unchanged      Progression   Progression Continue to progress workloads to maintain intensity without signs/symptoms of physical distress.    Average METs 3      Resistance Training   Training Prescription Yes    Weight 3 lbs    Reps 10-15    Time 10 Minutes      Interval Training   Interval Training No      Recumbant Bike   Level 2.5    RPM 60    Minutes 15    METs 3.4      NuStep   Level 2    SPM 75    Minutes 15    METs 2.6             Nutrition:  Target Goals: Understanding of nutrition guidelines, daily intake of sodium 1500mg , cholesterol 200mg , calories 30% from fat and 7% or less from saturated fats, daily to have 5 or more servings of fruits and vegetables.  Biometrics:  Pre Biometrics - 01/07/22 1015       Pre Biometrics   Waist Circumference 35.5 inches    Hip Circumference 39.75 inches    Waist to Hip Ratio 0.89 %    Triceps Skinfold 12 mm    %  Body Fat 31.1 %    Grip Strength 27 kg    Flexibility 14.5 in    Single Leg Stand 30 seconds              Nutrition Therapy Plan and Nutrition Goals:   Nutrition Assessments:  MEDIFICTS Score Key: ?70 Need to make dietary changes  40-70 Heart Healthy Diet ? 40 Therapeutic Level Cholesterol Diet   Picture Your Plate Scores: <51 Unhealthy dietary pattern with much room for improvement. 41-50 Dietary pattern unlikely to meet recommendations for good health  and room for improvement. 51-60 More healthful dietary pattern, with some room for improvement.  >60 Healthy dietary pattern, although there may be some specific behaviors that could be improved.    Nutrition Goals Re-Evaluation:   Nutrition Goals Discharge (Final Nutrition Goals Re-Evaluation):   Psychosocial: Target Goals: Acknowledge presence or absence of significant depression and/or stress, maximize coping skills, provide positive support system. Participant is able to verbalize types and ability to use techniques and skills needed for reducing stress and depression.  Initial Review & Psychosocial Screening:  Initial Psych Review & Screening - 01/07/22 1451       Initial Review   Current issues with None Identified      Family Dynamics   Good Support System? Yes   Arwyn has her parents and her children for support     Barriers   Psychosocial barriers to participate in program There are no identifiable barriers or psychosocial needs.      Screening Interventions   Interventions Encouraged to exercise             Quality of Life Scores:  Quality of Life - 01/07/22 1150       Quality of Life   Select Quality of Life      Quality of Life Scores   Health/Function Pre 24.75 %    Socioeconomic Pre 25.29 %    Psych/Spiritual Pre 24 %    Family Pre 28.5 %    GLOBAL Pre 25.17 %            Scores of 19 and below usually indicate a poorer quality of life in these areas.  A difference of  2-3 points is a clinically meaningful difference.  A difference of 2-3 points in the total score of the Quality of Life Index has been associated with significant improvement in overall quality of life, self-image, physical symptoms, and general health in studies assessing change in quality of life.  PHQ-9: Recent Review Flowsheet Data     Depression screen [facility] 2/9 01/07/2022   Decreased Interest 0   Down, Depressed, Hopeless 0   PHQ - 2 Score 0      Interpretation of Total  Score  Total Score Depression Severity:  1-4 = Minimal depression, 5-9 = Mild depression, 10-14 = Moderate depression, 15-19 = Moderately severe depression, 20-27 = Severe depression   Psychosocial Evaluation and Intervention:   Psychosocial Re-Evaluation:  Psychosocial Re-Evaluation     Row Name 01/11/22 1507             Psychosocial Re-Evaluation   Current issues with Current Stress Concerns       Comments Kandance was supposed to return to work at [employer] this week.       Expected Outcomes Tatyana will have decreased stressors upon completion of phase 2 cardiac rehab.       Interventions Encouraged to attend Cardiac Rehabilitation for the exercise  Continue Psychosocial Services  No Follow up required         Initial Review   Source of Stress Concerns Occupation       Comments Will continue to monitor and offer support as needed                Psychosocial Discharge (Final Psychosocial Re-Evaluation):  Psychosocial Re-Evaluation - 01/11/22 1507       Psychosocial Re-Evaluation   Current issues with Current Stress Concerns    Comments Arelyn was supposed to return to work at [employer] this week.    Expected Outcomes Kaelyn will have decreased stressors upon completion of phase 2 cardiac rehab.    Interventions Encouraged to attend Cardiac Rehabilitation for the exercise    Continue Psychosocial Services  No Follow up required      Initial Review   Source of Stress Concerns Occupation    Comments Will continue to monitor and offer support as needed             Vocational Rehabilitation: Provide vocational rehab assistance to qualifying candidates.   Vocational Rehab Evaluation & Intervention:  Vocational Rehab - 01/07/22 1452       Initial Vocational Rehab Evaluation & Intervention   Assessment shows need for Vocational Rehabilitation No   Anetra works full time and does not need vocational rehab at this time.            Education: Education  Goals: Education classes will be provided on a weekly basis, covering required topics. Participant will state understanding/return demonstration of topics presented.  Learning Barriers/Preferences:  Learning Barriers/Preferences - 01/07/22 1305       Learning Barriers/Preferences   Learning Barriers Sight   wears reading glasses   Learning Preferences Written Material;Computer/Internet;Pictoral;Video             Education Topics: Hypertension, Hypertension Reduction -Define heart disease and high blood pressure. Discus how high blood pressure affects the body and ways to reduce high blood pressure.   Exercise and Your Heart -Discuss why it is important to exercise, the FITT principles of exercise, normal and abnormal responses to exercise, and how to exercise safely.   Angina -Discuss definition of angina, causes of angina, treatment of angina, and how to decrease risk of having angina.   Cardiac Medications -Review what the following cardiac medications are used for, how they affect the body, and side effects that may occur when taking the medications.  Medications include Aspirin, Beta blockers, calcium channel blockers, ACE Inhibitors, angiotensin receptor blockers, diuretics, digoxin, and antihyperlipidemics.   Congestive Heart Failure -Discuss the definition of CHF, how to live with CHF, the signs and symptoms of CHF, and how keep track of weight and sodium intake.   Heart Disease and Intimacy -Discus the effect sexual activity has on the heart, how changes occur during intimacy as we age, and safety during sexual activity.   Smoking Cessation / COPD -Discuss different methods to quit smoking, the health benefits of quitting smoking, and the definition of COPD.   Nutrition I: Fats -Discuss the types of cholesterol, what cholesterol does to the heart, and how cholesterol levels can be controlled.   Nutrition II: Labels -Discuss the different components of food  labels and how to read food label   Heart Parts/Heart Disease and PAD -Discuss the anatomy of the heart, the pathway of blood circulation through the heart, and these are affected by heart disease.   Stress I: Signs and Symptoms -Discuss the causes  of stress, how stress may lead to anxiety and depression, and ways to limit stress.   Stress II: Relaxation -Discuss different types of relaxation techniques to limit stress.   Warning Signs of Stroke / TIA -Discuss definition of a stroke, what the signs and symptoms are of a stroke, and how to identify when someone is having stroke.   Knowledge Questionnaire Score:  Knowledge Questionnaire Score - 01/07/22 1151       Knowledge Questionnaire Score   Pre Score 26/28             Core Components/Risk Factors/Patient Goals at Admission:  Personal Goals and Risk Factors at Admission - 01/07/22 1312       Core Components/Risk Factors/Patient Goals on Admission    Weight Management Weight Maintenance    Tobacco Cessation Yes    Number of packs per day 0    Intervention Assist the participant in steps to quit. Provide individualized education and counseling about committing to Tobacco Cessation, relapse prevention, and pharmacological support that can be provided by physician.;Education officer, environmental, assist with locating and accessing local/national Quit Smoking programs, and support quit date choice.    Expected Outcomes Short Term: Will demonstrate readiness to quit, by selecting a quit date.;Short Term: Will quit all tobacco product use, adhering to prevention of relapse plan.;Long Term: Complete abstinence from all tobacco products for at least 12 months from quit date.    Heart Failure Yes    Intervention Provide a combined exercise and nutrition program that is supplemented with education, support and counseling about heart failure. Directed toward relieving symptoms such as shortness of breath, decreased exercise tolerance,  and extremity edema.    Expected Outcomes Improve functional capacity of life;Short term: Attendance in program 2-3 days a week with increased exercise capacity. Reported lower sodium intake. Reported increased fruit and vegetable intake. Reports medication compliance.;Short term: Daily weights obtained and reported for increase. Utilizing diuretic protocols set by physician.;Long term: Adoption of self-care skills and reduction of barriers for early signs and symptoms recognition and intervention leading to self-care maintenance.             Core Components/Risk Factors/Patient Goals Review:   Goals and Risk Factor Review     Row Name 01/11/22 1549             Core Components/Risk Factors/Patient Goals Review   Personal Goals Review Tobacco Cessation;Heart Failure       Review Khila started exercse on 01/11/21. Munirah did well with exercise. Vital signs were stable.       Expected Outcomes Skilar will continue to participate in exercise for education, nutrition and life style modifications.                Core Components/Risk Factors/Patient Goals at Discharge (Final Review):   Goals and Risk Factor Review - 01/11/22 1549       Core Components/Risk Factors/Patient Goals Review   Personal Goals Review Tobacco Cessation;Heart Failure    Review Ethleen started exercse on 01/11/21. Gabrielly did well with exercise. Vital signs were stable.    Expected Outcomes Areil will continue to participate in exercise for education, nutrition and life style modifications.             ITP Comments:  ITP Comments     Row Name 01/07/22 1120 01/11/22 1459         ITP Comments Dr Armanda Magic MD, Medical Director 30 Day ITP Review. Alana started exercise at cardiac rehab on 01/11/22.  Winda did well with exercise.               Comments: See ITP comments.Thayer Headings RN BSN

## 2022-01-13 ENCOUNTER — Encounter (HOSPITAL_COMMUNITY): Payer: No Typology Code available for payment source

## 2022-01-13 ENCOUNTER — Telehealth (HOSPITAL_COMMUNITY): Payer: Self-pay

## 2022-01-15 ENCOUNTER — Encounter (HOSPITAL_COMMUNITY)
Admission: RE | Admit: 2022-01-15 | Discharge: 2022-01-15 | Disposition: A | Discharge status: Home / Self Care | Payer: No Typology Code available for payment source | Source: Ambulatory Visit | Attending: Internal Medicine | Admitting: Internal Medicine

## 2022-01-15 ENCOUNTER — Other Ambulatory Visit: Payer: Self-pay

## 2022-01-15 ENCOUNTER — Other Ambulatory Visit (HOSPITAL_COMMUNITY): Payer: Self-pay | Admitting: *Deleted

## 2022-01-15 DIAGNOSIS — Z952 Presence of prosthetic heart valve: Secondary | ICD-10-CM | POA: Diagnosis not present

## 2022-01-15 MED ORDER — APIXABAN 5 MG PO TABS: 5.0000 mg | ORAL_TABLET | Freq: Two times a day (BID) | ORAL | 1 refills | Status: DC

## 2022-01-18 ENCOUNTER — Encounter (HOSPITAL_COMMUNITY): Payer: No Typology Code available for payment source

## 2022-01-18 ENCOUNTER — Telehealth (HOSPITAL_COMMUNITY): Payer: Self-pay | Admitting: Emergency Medicine

## 2022-01-20 ENCOUNTER — Encounter (HOSPITAL_COMMUNITY): Payer: No Typology Code available for payment source

## 2022-01-22 ENCOUNTER — Encounter (HOSPITAL_COMMUNITY): Payer: No Typology Code available for payment source

## 2022-01-22 ENCOUNTER — Other Ambulatory Visit (HOSPITAL_COMMUNITY): Payer: No Typology Code available for payment source

## 2022-01-22 ENCOUNTER — Telehealth (HOSPITAL_COMMUNITY): Payer: Self-pay | Admitting: Vascular Surgery

## 2022-01-22 NOTE — Telephone Encounter (Signed)
Left pt VM to resch 3/6 appt due to Doctor being out office

## 2022-01-25 ENCOUNTER — Encounter (HOSPITAL_COMMUNITY): Payer: No Typology Code available for payment source

## 2022-01-27 ENCOUNTER — Encounter (HOSPITAL_COMMUNITY): Payer: No Typology Code available for payment source

## 2022-01-27 ENCOUNTER — Telehealth (HOSPITAL_COMMUNITY): Payer: Self-pay | Admitting: Cardiology

## 2022-01-27 NOTE — Telephone Encounter (Signed)
Pt reports she was told she needed an abx for dental appts with recent cardiac surgery.   Advised will send message to provider, it would also be beneficially to contact cardiac surgeon.  Message to provider for correct dosage

## 2022-01-28 MED ORDER — AMOXICILLIN 500 MG PO CAPS: 2000.0000 mg | ORAL_CAPSULE | Freq: Once | ORAL | 0 refills | Status: AC

## 2022-01-28 NOTE — Telephone Encounter (Signed)
Detailed message left for patient With medication sent to pharmacy

## 2022-01-29 ENCOUNTER — Other Ambulatory Visit: Payer: Self-pay

## 2022-01-29 ENCOUNTER — Ambulatory Visit (HOSPITAL_COMMUNITY)
Admission: RE | Admit: 2022-01-29 | Discharge: 2022-01-29 | Disposition: A | Discharge status: Home / Self Care | Payer: No Typology Code available for payment source | Source: Ambulatory Visit | Attending: Cardiology | Admitting: Cardiology

## 2022-01-29 ENCOUNTER — Encounter (HOSPITAL_COMMUNITY): Payer: No Typology Code available for payment source

## 2022-01-29 ENCOUNTER — Telehealth (HOSPITAL_COMMUNITY): Payer: Self-pay | Admitting: *Deleted

## 2022-01-29 DIAGNOSIS — I5022 Chronic systolic (congestive) heart failure: Secondary | ICD-10-CM | POA: Insufficient documentation

## 2022-01-29 LAB — BASIC METABOLIC PANEL
Anion gap: 7 (ref 5–15)
BUN: 9 mg/dL (ref 6–20)
CO2: 24 mmol/L (ref 22–32)
Calcium: 9.1 mg/dL (ref 8.9–10.3)
Chloride: 104 mmol/L (ref 98–111)
Creatinine, Ser: 0.95 mg/dL (ref 0.44–1.00)
GFR, Estimated: >60 mL/min (ref >60)
Glucose, Bld: 105 mg/dL — ABNORMAL HIGH (ref 70–99)
Potassium: 3.9 mmol/L (ref 3.5–5.1)
Sodium: 135 mmol/L (ref 135–145)

## 2022-02-01 ENCOUNTER — Encounter (HOSPITAL_COMMUNITY): Payer: No Typology Code available for payment source

## 2022-02-03 ENCOUNTER — Encounter (HOSPITAL_COMMUNITY): Payer: No Typology Code available for payment source

## 2022-02-03 ENCOUNTER — Telehealth (HOSPITAL_COMMUNITY): Payer: Self-pay | Admitting: *Deleted

## 2022-02-03 NOTE — Telephone Encounter (Signed)
Left message to call cardiac rehab regarding exercise appointments.Barnet Pall, RN,BSN 02/03/2022 9:03 AM

## 2022-02-05 ENCOUNTER — Encounter (HOSPITAL_COMMUNITY): Payer: No Typology Code available for payment source

## 2022-02-05 ENCOUNTER — Telehealth (HOSPITAL_COMMUNITY): Payer: Self-pay | Admitting: *Deleted

## 2022-02-05 NOTE — Telephone Encounter (Signed)
Left message to call cardiac rehab. Will discharge from cardiac rehab at this time. Coraline attended two exercise sessions on 01/11/22 and 01/15/22.Gladstone Lighter, RN,BSN 02/05/2022 12:10 PM

## 2022-02-08 ENCOUNTER — Encounter (HOSPITAL_COMMUNITY): Payer: No Typology Code available for payment source

## 2022-02-09 NOTE — Addendum Note (Signed)
Encounter addended by: Lorin Picket on: 02/09/2022 8:25 AM  Actions taken: Flowsheet accepted

## 2022-02-10 ENCOUNTER — Encounter (HOSPITAL_COMMUNITY): Payer: No Typology Code available for payment source

## 2022-02-12 ENCOUNTER — Encounter (HOSPITAL_COMMUNITY): Payer: No Typology Code available for payment source

## 2022-02-15 ENCOUNTER — Encounter (HOSPITAL_COMMUNITY): Payer: No Typology Code available for payment source

## 2022-02-17 ENCOUNTER — Encounter (HOSPITAL_COMMUNITY): Payer: No Typology Code available for payment source

## 2022-02-19 ENCOUNTER — Encounter (HOSPITAL_COMMUNITY): Payer: No Typology Code available for payment source

## 2022-02-22 ENCOUNTER — Encounter (HOSPITAL_COMMUNITY): Payer: No Typology Code available for payment source

## 2022-02-24 ENCOUNTER — Encounter (HOSPITAL_COMMUNITY): Payer: No Typology Code available for payment source

## 2022-02-26 ENCOUNTER — Encounter (HOSPITAL_COMMUNITY): Payer: No Typology Code available for payment source

## 2022-03-01 ENCOUNTER — Encounter (HOSPITAL_COMMUNITY): Payer: No Typology Code available for payment source | Admitting: Internal Medicine

## 2022-03-01 ENCOUNTER — Encounter (HOSPITAL_COMMUNITY): Payer: No Typology Code available for payment source

## 2022-03-01 ENCOUNTER — Other Ambulatory Visit (HOSPITAL_COMMUNITY): Payer: No Typology Code available for payment source

## 2022-03-03 ENCOUNTER — Encounter (HOSPITAL_COMMUNITY): Payer: No Typology Code available for payment source

## 2022-03-05 ENCOUNTER — Other Ambulatory Visit (HOSPITAL_COMMUNITY): Payer: Self-pay | Admitting: *Deleted

## 2022-03-05 ENCOUNTER — Encounter (HOSPITAL_COMMUNITY): Payer: No Typology Code available for payment source

## 2022-03-05 MED ORDER — FUROSEMIDE 40 MG PO TABS: 40.0000 mg | ORAL_TABLET | Freq: Every day | ORAL | 3 refills | Status: DC | PRN

## 2022-03-28 ENCOUNTER — Other Ambulatory Visit (HOSPITAL_COMMUNITY): Payer: Self-pay | Admitting: Family Medicine

## 2022-03-31 ENCOUNTER — Ambulatory Visit (HOSPITAL_COMMUNITY)
Admission: RE | Admit: 2022-03-31 | Discharge: 2022-03-31 | Disposition: A | Discharge status: Home / Self Care | Payer: No Typology Code available for payment source | Source: Ambulatory Visit | Attending: Internal Medicine | Admitting: Internal Medicine

## 2022-03-31 ENCOUNTER — Other Ambulatory Visit (HOSPITAL_COMMUNITY): Payer: Self-pay | Admitting: Internal Medicine

## 2022-03-31 ENCOUNTER — Encounter (HOSPITAL_COMMUNITY): Payer: Self-pay | Admitting: Internal Medicine

## 2022-03-31 ENCOUNTER — Ambulatory Visit (HOSPITAL_BASED_OUTPATIENT_CLINIC_OR_DEPARTMENT_OTHER)
Admission: RE | Admit: 2022-03-31 | Discharge: 2022-03-31 | Disposition: A | Discharge status: Home / Self Care | Payer: No Typology Code available for payment source | Source: Ambulatory Visit | Attending: Internal Medicine | Admitting: Internal Medicine

## 2022-03-31 ENCOUNTER — Ambulatory Visit (HOSPITAL_COMMUNITY)
Admission: RE | Admit: 2022-03-31 | Discharge: 2022-03-31 | Disposition: A | Discharge status: Home / Self Care | Payer: No Typology Code available for payment source | Source: Ambulatory Visit | Attending: Family Medicine | Admitting: Family Medicine

## 2022-03-31 VITALS — BP 138/88 | HR 86 | Wt 159.0 lb

## 2022-03-31 DIAGNOSIS — Z952 Presence of prosthetic heart valve: Secondary | ICD-10-CM | POA: Diagnosis not present

## 2022-03-31 DIAGNOSIS — Z8774 Personal history of (corrected) congenital malformations of heart and circulatory system: Secondary | ICD-10-CM | POA: Diagnosis not present

## 2022-03-31 DIAGNOSIS — Z7901 Long term (current) use of anticoagulants: Secondary | ICD-10-CM | POA: Diagnosis not present

## 2022-03-31 DIAGNOSIS — K7460 Unspecified cirrhosis of liver: Secondary | ICD-10-CM | POA: Diagnosis not present

## 2022-03-31 DIAGNOSIS — Z7984 Long term (current) use of oral hypoglycemic drugs: Secondary | ICD-10-CM | POA: Insufficient documentation

## 2022-03-31 DIAGNOSIS — I5022 Chronic systolic (congestive) heart failure: Secondary | ICD-10-CM

## 2022-03-31 DIAGNOSIS — I351 Nonrheumatic aortic (valve) insufficiency: Secondary | ICD-10-CM | POA: Diagnosis not present

## 2022-03-31 DIAGNOSIS — F1011 Alcohol abuse, in remission: Secondary | ICD-10-CM | POA: Insufficient documentation

## 2022-03-31 DIAGNOSIS — Z79899 Other long term (current) drug therapy: Secondary | ICD-10-CM | POA: Diagnosis not present

## 2022-03-31 DIAGNOSIS — Z953 Presence of xenogenic heart valve: Secondary | ICD-10-CM | POA: Diagnosis not present

## 2022-03-31 DIAGNOSIS — I480 Paroxysmal atrial fibrillation: Secondary | ICD-10-CM | POA: Insufficient documentation

## 2022-03-31 DIAGNOSIS — R0902 Hypoxemia: Secondary | ICD-10-CM | POA: Diagnosis not present

## 2022-03-31 DIAGNOSIS — I110 Hypertensive heart disease with heart failure: Secondary | ICD-10-CM | POA: Diagnosis present

## 2022-03-31 DIAGNOSIS — Z87891 Personal history of nicotine dependence: Secondary | ICD-10-CM | POA: Insufficient documentation

## 2022-03-31 DIAGNOSIS — I5082 Biventricular heart failure: Secondary | ICD-10-CM | POA: Insufficient documentation

## 2022-03-31 LAB — ECHOCARDIOGRAM COMPLETE
AR max vel: 2.27 cm2
AV Area VTI: 2.08 cm2
AV Area mean vel: 2.12 cm2
AV Mean grad: 7 mmHg
AV Peak grad: 12.4 mmHg
Ao pk vel: 1.76 m/s
Calc EF: 22.7 %
S' Lateral: 5.8 cm
Single Plane A2C EF: 25.7 %
Single Plane A4C EF: 16.4 %

## 2022-03-31 MED ORDER — SPIRONOLACTONE 25 MG PO TABS: 12.5000 mg | ORAL_TABLET | Freq: Every day | ORAL | 3 refills | Status: DC

## 2022-03-31 MED ORDER — LOSARTAN POTASSIUM 25 MG PO TABS: 25.0000 mg | ORAL_TABLET | Freq: Every day | ORAL | 3 refills | Status: DC

## 2022-03-31 NOTE — Progress Notes (Signed)
?  Echocardiogram ?2D Echocardiogram has been performed. ? ?Augustine Radar ?03/31/2022, 3:10 PM ?

## 2022-03-31 NOTE — Patient Instructions (Signed)
Take Furosemide only AS NEEDED  ? ?Start Losartan 25 mg Daily AT BEDTIME ? ?Start Spironolactone 12.5 mg (1/2 tab) Daily ? ?Your physician recommends that you return for lab work in: 2 weeks ? ?Your provider has recommended that  you wear a Zio Patch for 14 days.  This monitor will record your heart rhythm for our review.  IF you have any symptoms while wearing the monitor please press the button.  If you have any issues with the patch or you notice a red or orange light on it please call the company at [phone].  Once you remove the patch please mail it back to the company as soon as possible so we can get the results. ? ?Please follow up with our heart failure pharmacist in 3-4 weeks ? ?Your physician recommends that you schedule a follow-up appointment in: 6-8 weeks and again in 3 months with an echocardiogram ? ?If you have any questions or concerns before your next appointment please send [place] a message through [place] or call our office at [phone].   ? ?TO LEAVE A MESSAGE FOR THE NURSE SELECT OPTION 2, PLEASE LEAVE A MESSAGE INCLUDING: ?YOUR NAME ?DATE OF BIRTH ?CALL BACK NUMBER ?REASON FOR CALL**this is important as we prioritize the call backs ? ?YOU WILL RECEIVE A CALL BACK THE SAME DAY AS LONG AS YOU CALL BEFORE 4:00 PM ? ?At the [facility], you and your health needs are our priority. As part of our continuing mission to provide you with exceptional heart care, we have created designated Provider Care Teams. These Care Teams include your primary Cardiologist (physician) and Advanced Practice Providers (APPs- Physician Assistants and Nurse Practitioners) who all work together to provide you with the care you need, when you need it.  ? ?You may see any of the following providers on your designated Care Team at your next follow up: ?Dr Arvilla Meres ?Dr Marca Ancona ?Tonye Becket, NP ?Robbie Lis, PA ?Jessica Milford,NP ?Anna Genre, PA ?Karle Plumber, PharmD ? ? ?Please be  sure to bring in all your medications bottles to every appointment.  ? ? ?

## 2022-03-31 NOTE — Progress Notes (Addendum)
? ? ?                Advanced [facility] Failure Clinic Note ? ? ?PCP: [facility]  Jon Billings FNP  ?HF Cardiologist: Dr Gala Romney ? ?HPI: ?Pamela Bryan is a 51 y.o. female with PMH of alcohol use disorder, tobacco use disorder and systolic HF due to NICM. ?  ?Admitted with 3/22 with acute systolic HF. EF 15-20% Cath with preserved cardiac output and severely reduced EF. MRI EF 11% concerning for possible myocarditis. Started on GDMT. Counseled to stop drinking alcohol.  ? ?Echo 8/22 EF 20% severely dilated RV normal AoV functionally bicuspid mild AS at least moderate AI (reviewed by Dr. Gala Romney).  ? ?Losartan added at her follow up and she was scheduled for TEE to evaluate AV (? Bicuspid & severe AI) but cancelled as she did not have anyone to take her to procedure. ? ?TEE (9/22) EF 20% with a severely dilated left ventricle and severe aortic insufficiency.  Right ventricular systolic function is moderately depressed.  ? ?RHC 11/22 showed well compensated filling pressures, moderately reduced CO 2/2 severe AI and LV dysfunction. She underwent aortic valve replacement with bioprosthetic valve 11/22. Hospitalization c/b post-op AF/ALF requiring IV amio and rapid pacing to NSR. Unfortunately went back into AFL, and started on Eliquis. Carvedilol, Farxiga, Lasix, losartan, spiro and ivabradine stopped at discharge, weight 145 lbs. ? ?She returns for HF follow up. Overall feeling fine. Denies SOB/PND/Orthopnea. Appetite ok. No fever or chills. Weight at home 150-153 pounds. Taking all medications. Taking lasix 1-2 times per week. She continues to work full time at [employer]. She no longer smokes or drink alcohol.  ? ?Cardiac studies: ?- RHC (11/22):   ?RA = 5 ?RV = 27/6 ?PA = 27/12 (20) ?PCW = 15 ?Fick cardiac output/index = 3.7/2.1 ?PVR = 1.4 WU ?Ao sat = 99% ?PA sat = 66%, 69% ?  ?Assessment: ?1. Well compensate filling pressure with moderately reduced CO in setting of severe AI and significant LV dysfunction ? ?- TEE (9/22):  EF 20%, severely dilated LV and severe AI, RV moderately depressed. ? ?- Echo (3/22): EF < 20% RV normal ? ?- RHC/LHC 02/2021  ?Ao = 86/68 (77) ?LV = 92/25 ?RA =  4 ?RV = 36/10 ?PA = 36/17 (25) ?PCW = 20 ?Fick cardiac output/index = 5.4/3.0 ?PVR = 1.0 WU ?SVR = 1088 ?Ao sat = 98% ?PA sat = 74%, 78% ?High SVC sat = 83%  ? ?Assessment: ?1. Normal coronary arteries ?2. Severe NICM EF 15% ?3. Elevated filling pressures ?4. Cardiac output higher than I expected. Suspect peripheral shunting in setting of probable cirrhosis ? ?- cMRI (02/2021):Marland Kitchen Severely dilated left ventricle with EF 11%, diffuse hypokinesis.  ?2.  Mildly dilated right ventricle with EF 33%.  ?3. Abnormal aortic valve, looks functionally bicuspid. By ?regurgitant fraction, there appears to be severe aortic stenosis. ?Would consider TEE confirmation.  ?4. Delayed enhancement imaging shows inferoseptal RV insertion site ?LGE which tends to be nonspecific and related to volume/pressure ?overload. However, the lateral wall subepicardial LGE is concerning ?for possible prior myocarditis (less likely sarcoidosis). It is not ?in a CAD pattern. ? ?ROS: All systems negative except as listed in HPI, PMH and Problem List. ? ?SH:  ?Social History  ? ?Socioeconomic History  ? Marital status: Divorced  ?  Spouse name: Not on file  ? Number of children: 4  ? Years of education: 48  ? Highest education level: Bachelor's degree (e.g., BA, AB, BS)  ?  Occupational History  ? Not on file  ?Tobacco Use  ? Smoking status: Former  ?  Packs/day: 1.00  ?  Years: 30.00  ?  Pack years: 30.00  ?  Types: Cigarettes  ?  Quit date: 11/02/2021  ?  Years since quitting: 0.4  ? Smokeless tobacco: Never  ?Vaping Use  ? Vaping Use: Never used  ?Substance and Sexual Activity  ? Alcohol use: Yes  ?  Comment: occasional  ? Drug use: No  ? Sexual activity: Not Currently  ?Other Topics Concern  ? Not on file  ?Social History Narrative  ? Not on file  ? ?Social Determinants of Health  ? ?Financial  Resource Strain: Not on file  ?Food Insecurity: Not on file  ?Transportation Needs: Not on file  ?Physical Activity: Not on file  ?Stress: Not on file  ?Social Connections: Not on file  ?Intimate Partner Violence: Not on file  ? ?FH:  ?Family History  ?Problem Relation Age of Onset  ? Valvular heart disease Sister   ? ?Past Medical History:  ?Diagnosis Date  ? CHF (congestive heart failure) (HCC)   ? Coronary artery disease   ? History of hiatal hernia   ? Hypertension   ? ?Current Outpatient Medications  ?Medication Sig Dispense Refill  ? aspirin EC 81 MG EC tablet Take 1 tablet (81 mg total) by mouth daily. Swallow whole.    ? buPROPion (WELLBUTRIN SR) 150 MG 12 hr tablet Take 150 mg by mouth 2 (two) times daily.    ? dapagliflozin propanediol (FARXIGA) 10 MG TABS tablet Take 1 tablet (10 mg total) by mouth daily before breakfast. 30 tablet 11  ? diphenhydrAMINE (BENADRYL) 25 MG tablet Take 25 mg by mouth at bedtime as needed for allergies.    ? ELIQUIS 5 MG TABS tablet TAKE 1 TABLET BY MOUTH TWICE A DAY 60 tablet 1  ? furosemide (LASIX) 40 MG tablet Take 1 tablet (40 mg total) by mouth daily as needed for fluid or edema. 15 tablet 3  ? levonorgestrel (LILETTA, 52 MG,) 19.5 MCG/DAY IUD IUD 1 Intra Uterine Device by Intrauterine route once.    ? Multiple Vitamin (MULTIVITAMIN) capsule Take 1 capsule by mouth daily.    ? Polyethyl Glycol-Propyl Glycol (LUBRICANT EYE DROPS) 0.4-0.3 % SOLN Place 1-2 drops into both eyes 3 (three) times daily as needed (dry/irritated eyes).    ? ?No current facility-administered medications for this encounter.  ? ?BP 138/88   Pulse 86   Wt 72.1 kg (159 lb)   SpO2 98%   BMI 24.72 kg/m?  ? ?Wt Readings from Last 3 Encounters:  ?03/31/22 72.1 kg (159 lb)  ?01/08/22 70.6 kg (155 lb 9.6 oz)  ?01/07/22 70.4 kg (155 lb 3.3 oz)  ? ?General:  Well appearing. No resp difficulty ?HEENT: normal ?Neck: supple. no JVD. Carotids 2+ bilat; no bruits. No lymphadenopathy or thryomegaly  appreciated. ?Cor: PMI nondisplaced. Regular rate & rhythm. No rubs, gallops or murmurs. ?Lungs: clear ?Abdomen: soft, nontender, nondistended. No hepatosplenomegaly. No bruits or masses. Good bowel sounds. ?Extremities: no cyanosis, clubbing, rash, edema ?Neuro: alert & orientedx3, cranial nerves grossly intact. moves all 4 extremities w/o difficulty. Affect pleasant ? ? ?ASSESSMENT & PLAN: ?1. Chronic systolic CHF, Biventricular CHF, NICM  ?- Echo (03/12/21): EF 15% severe RV dysfunction. ?- Cath (03/13/21): EF 15% No CAD. Elevated filling pressures. High cardiac output.  ?- cMRI 03/16/2021  EF < 20%, possible myocaritis.  ?- Possible ETOH CM. TSH normal, UDS negative. ?-  Echo (07/30/21): EF 20% severely dilated RV normal AoV functionally bicuspid, mild AS at least moderate AI. ?- TEE (9/22): EF 20%, severely dilated LV, RV moderately depressed, severe AI. ?- Echo today LVEF 20-25%. LVID < 6. RV ok. Dr Gala Romney reviewed and discussed.  ?- NYHA I. Volume status stable. Continue lasix as needed. ?- Consider coreg at next visit.  ?-Continue  Farxiga 10  mg daily. ?- Add losartan 25 mg daily at bedtime.  ?- Add 12.5 mg spiro daily.  ?- Continue digoxin 0.125 mg daily. ? ?2. Paroxysmal AFL, post-op ?- Required IV amio and RAP. No recurrence.  ?- Off ivabradine with AFL/AF. ?- Continue Eliquis 5 mg bid. ?-Place 14 days Zio if no A fib will stop eliquis.  ? ?3. Severe aortic insufficiency ?- S/P bioprosthetic AVR 11/22 ? ?4. H/o cirrhosis ?- Suggestive on U/S 3/22. ? ?5. Alcohol Use disorder: ?- No longer drinking alcohol ?  ?6. Tobacco Use disorder: ?- No longer smoking.  ? ?7. Night Time Hypoxia ?- Sleep study negative for OSA.  ?- Has night time hypoxia. ? ? ?Follow up in 2 weeks for BMET  ?Follow up in 4 weeks with pharmacy  ?Follow up in 6 weeks with APP ?Follow up in 3-4 months with Dr Gala Romney and an ECHO.  ? ?Tonye Becket NP-C  ?03/31/22 ?3:39 PM ? ?Patient seen and examined with the above-signed Advanced Practice  Provider and/or Housestaff. I personally reviewed laboratory data, imaging studies and relevant notes. I independently examined the patient and formulated the important aspects of the plan. I have edited the note to reflec

## 2022-04-13 NOTE — Progress Notes (Incomplete)
***In Progress*** ?                  ?                 ?Advanced Heart Failure Clinic Note ? ?PCP: [facility]  MS morrison FNP  ?HF Cardiologist: Dr Bensimhon ? ?HPI:  ?Pamela Bryan is a 50 y.o. female with PMH of alcohol use disorder, tobacco use disorder and systolic HF due to NICM. ?  ?Admitted with 02/2021 with acute systolic HF. EF 15-20%. Cath with preserved cardiac output and severely reduced EF. MRI EF 11% concerning for possible myocarditis. Started on GDMT. Counseled to stop drinking alcohol.  ?  ?Echo 07/2021 EF 20% severely dilated RV normal AoV functionally bicuspid mild AS at least moderate AI (reviewed by Dr. Bensimhon).  ?  ?Losartan added at her follow up and she was scheduled for TEE to evaluate AV (? Bicuspid & severe AI) but cancelled as she did not have anyone to take her to procedure. ? ?TEE (08/2021) EF 20% with a severely dilated left ventricle and severe aortic insufficiency.  Right ventricular systolic function is moderately depressed.  ?  ?RHC 10/2021 showed well compensated filling pressures, moderately reduced CO 2/2 severe AI and LV dysfunction. She underwent aortic valve replacement with bioprosthetic valve 10/2021. Hospitalization c/b post-op AF/ALF requiring IV amiodarone and rapid pacing to NSR. Unfortunately went back into AFL, and was started on Eliquis. Carvedilol, Farxiga, Lasix, losartan, spironolactone and ivabradine stopped at discharge, weight 145 lbs. ?  ?Recently presented to AHF Clinic for follow up 03/31/22. Overall was feeling fine. Denied SOB/PND/Orthopnea. Appetite was ok. No fever or chills. Weight at home was 150-153 pounds. Reported taking all medications. Reported taking Lasix 1-2 times per week. She continues to work full time at [employer]. She no longer smokes or drink alcohol.  ? ?Today she returns to HF clinic for pharmacist medication titration. At last visit with MD, losartan 25 mg daily and spironolactone 12.5 mg daily were initiated. Zio patch placed  with goal to  discontinued AC if no recurrent AF (had post-op AF only).  ? ? ?HF Medications: ?Losartan 25 mg daily ?Spironolactone 12.5 mg daily ?Farxiga 10 mg daily ?Digoxin 0.125 mg daily ?Lasix 40 mg PRN ? ? ?Has the patient been experiencing any side effects to the medications prescribed?  no ? ?Does the patient have any problems obtaining medications due to transportation or finances?   No; [employer] commercial insurance ? ?Understanding of regimen: good ?Understanding of indications: good ?Potential of compliance: good ?Patient understands to avoid NSAIDs. ?Patient understands to avoid decongestants. ?  ? ?Pertinent Lab Values: ?01/29/22: Serum creatinine 0.95., BUN 9, Potassium 3.9, Sodium 135 ? ? ?Vital Signs: *** ?Weight: 150.6 lbs (last clinic weight: 150 lbs) ?Blood pressure: 134/88  ?Heart rate: 98  ? ?Assessment/Plan: ?1. Chronic systolic CHF, Biventricular CHF, NICM  ?- Echo (03/12/21): EF 15% severe RV dysfunction. ?- Cath (03/13/21): EF 15% No CAD. Elevated filling pressures. High cardiac output.  ?- cMRI 03/16/2021  EF < 20%, possible myocaritis.  ?- Possible ETOH CM. TSH normal, UDS negative. ?- Echo (07/30/21): EF 20% severely dilated RV normal AoV functionally bicuspid, mild AS at least moderate AI. ?- TEE (08/2021): EF 20%, severely dilated LV, RV moderately depressed, severe AI. ?- Echo 03/31/22 LVEF 20-25%. LVID < 6. RV ok.  ?- NYHA I. Volume status stable.  ?- Continue Lasix 40 mg PRN ?- Consider coreg at next visit. *** ?- Continue Farxiga 10   mg daily. ?- Continue losartan 25 mg daily at bedtime.  ?- Continue spironolactone 12.5 mg daily.  ?- Continue digoxin 0.125 mg daily. ?  ?2. Paroxysmal AFL, post-op ?- Required IV amiodarone and RAP. No recurrence.  ?- Off ivabradine with AFL/AF. ?- Continue Eliquis 5 mg BID. ?-14 day Zio patch results pending -  if no A fib will stop Eliquis.  ?  ?3. Severe aortic insufficiency ?- S/P bioprosthetic AVR 10/2021 ?  ?4. H/o cirrhosis ?- Suggestive on U/S 02/2021. ?  ?5. Alcohol Use  disorder: ?- No longer drinking alcohol ?  ?6. Tobacco Use disorder: ?- No longer smoking.  ?  ?7. Night Time Hypoxia ?- Sleep study negative for OSA.  ?- Has night time hypoxia. ?  ? ?Follow up *** ? ? ?Leilanie Rauda, PharmD, BCPS, BCCP, CPP ?Heart Failure Clinic Pharmacist ?[phone] ?

## 2022-04-14 ENCOUNTER — Ambulatory Visit (HOSPITAL_COMMUNITY)
Admission: RE | Admit: 2022-04-14 | Discharge: 2022-04-14 | Disposition: A | Discharge status: Home / Self Care | Payer: No Typology Code available for payment source | Source: Ambulatory Visit | Attending: Cardiology | Admitting: Cardiology

## 2022-04-14 DIAGNOSIS — I5022 Chronic systolic (congestive) heart failure: Secondary | ICD-10-CM | POA: Diagnosis present

## 2022-04-14 LAB — BASIC METABOLIC PANEL
Anion gap: 9 (ref 5–15)
BUN: 18 mg/dL (ref 6–20)
CO2: 23 mmol/L (ref 22–32)
Calcium: 9.1 mg/dL (ref 8.9–10.3)
Chloride: 106 mmol/L (ref 98–111)
Creatinine, Ser: 0.91 mg/dL (ref 0.44–1.00)
GFR, Estimated: >60 mL/min (ref >60)
Glucose, Bld: 105 mg/dL — ABNORMAL HIGH (ref 70–99)
Potassium: 3.8 mmol/L (ref 3.5–5.1)
Sodium: 138 mmol/L (ref 135–145)

## 2022-04-19 ENCOUNTER — Telehealth (HOSPITAL_COMMUNITY): Payer: Self-pay | Admitting: Vascular Surgery

## 2022-04-19 NOTE — Telephone Encounter (Signed)
Lvm asking pt to call back to [facility] pharm appt 5/3, lauren will be out of office ?

## 2022-04-22 NOTE — Progress Notes (Incomplete)
***In Progress*** ?                  ?                 ?Advanced Heart Failure Clinic Note ? ?PCP: [facility]  MS morrison FNP  ?HF Cardiologist: Dr Haroldine Laws ? ?HPI:  ?Pamela Bryan is a 51 y.o. female with PMH of alcohol use disorder, tobacco use disorder and systolic HF due to NICM. ?  ?Admitted with 02/2021 with acute systolic HF. EF 15-20%. Cath with preserved cardiac output and severely reduced EF. MRI EF 11% concerning for possible myocarditis. Started on GDMT. Counseled to stop drinking alcohol.  ?  ?Echo 07/2021 EF 20% severely dilated RV normal AoV functionally bicuspid mild AS at least moderate AI (reviewed by Dr. Haroldine Laws).  ?  ?Losartan added at her follow up and she was scheduled for TEE to evaluate AV (? Bicuspid & severe AI) but cancelled as she did not have anyone to take her to procedure. ? ?TEE (08/2021) EF 20% with a severely dilated left ventricle and severe aortic insufficiency.  Right ventricular systolic function is moderately depressed.  ?  ?Grand 10/2021 showed well compensated filling pressures, moderately reduced CO 2/2 severe AI and LV dysfunction. She underwent aortic valve replacement with bioprosthetic valve 10/2021. Hospitalization c/b post-op AF/ALF requiring IV amiodarone and rapid pacing to NSR. Unfortunately went back into AFL, and was started on Eliquis. Carvedilol, Farxiga, Lasix, losartan, spironolactone and ivabradine stopped at discharge, weight 145 lbs. ?  ?Recently presented to AHF Clinic for follow up 03/31/22. Overall was feeling fine. Denied SOB/PND/Orthopnea. Appetite was ok. No fever or chills. Weight at home was 150-153 pounds. Reported taking all medications. Reported taking Lasix 1-2 times per week. She continues to work full time at [employer] She no longer smokes or drink alcohol.  ? ?Today she returns to HF clinic for pharmacist medication titration. At last visit with MD, losartan 25 mg daily and spironolactone 12.5 mg daily were initiated. Zio patch placed  with goal to  discontinued AC if no recurrent AF (had post-op AF only).  ? ? ?HF Medications: ?Losartan 25 mg daily ?Spironolactone 12.5 mg daily ?Farxiga 10 mg daily ?Digoxin 0.125 mg daily ?Lasix 40 mg PRN ? ? ?Has the patient been experiencing any side effects to the medications prescribed?  no ? 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 ?Understanding of regimen: good ?Understanding of indications: good ?Potential of compliance: good ?Patient understands to avoid NSAIDs. ?Patient understands to avoid decongestants. ?  ? ?Pertinent Lab Values: ?01/29/22: Serum creatinine 0.95., BUN 9, Potassium 3.9, Sodium 135 ? ? ?Vital Signs: *** ?Weight: 150.6 lbs (last clinic weight: 150 lbs) ?Blood pressure: 134/88  ?Heart rate: 98  ? ?Assessment/Plan: ?1. Chronic systolic CHF, Biventricular CHF, NICM  ?- Echo (03/12/21): EF 15% severe RV dysfunction. ?- Cath (03/13/21): EF 15% No CAD. Elevated filling pressures. High cardiac output.  ?- cMRI 03/16/2021  EF < 20%, possible myocaritis.  ?- Possible ETOH CM. TSH normal, UDS negative. ?- Echo (07/30/21): EF 20% severely dilated RV normal AoV functionally bicuspid, mild AS at least moderate AI. ?- TEE (08/2021): EF 20%, severely dilated LV, RV moderately depressed, severe AI. ?- Echo 03/31/22 LVEF 20-25%. LVID < 6. RV ok.  ?- NYHA I. Volume status stable.  ?- Continue Lasix 40 mg PRN ?- Consider coreg at next visit. *** ?- Continue Farxiga 10  mg daily. ?- Continue losartan 25 mg daily at bedtime.  ?- Continue spironolactone 12.5 mg daily.  ?- Continue digoxin 0.125 mg daily. ?  ?2. Paroxysmal AFL, post-op ?- Required IV amiodarone and RAP. No recurrence.  ?- Off ivabradine with AFL/AF. ?- Continue Eliquis 5 mg BID. ?-14 day Zio patch results pending -  if no A fib will stop Eliquis.  ?  ?3. Severe aortic insufficiency ?- S/P bioprosthetic AVR 10/2021 ?  ?4. H/o cirrhosis ?- Suggestive on U/S 02/2021. ?  ?5. Alcohol Use  disorder: ?- No longer drinking alcohol ?  ?6. Tobacco Use disorder: ?- No longer smoking.  ?  ?7. Night Time Hypoxia ?- Sleep study negative for OSA.  ?- Has night time hypoxia. ?  ? ?Follow up *** ? ? ?Audry Riles, PharmD, BCPS, BCCP, CPP ?Heart Failure Clinic Pharmacist ?[phone] ?

## 2022-04-28 ENCOUNTER — Inpatient Hospital Stay (HOSPITAL_COMMUNITY): Admission: RE | Admit: 2022-04-28 | Payer: No Typology Code available for payment source | Source: Ambulatory Visit

## 2022-05-05 ENCOUNTER — Encounter (HOSPITAL_COMMUNITY): Payer: Self-pay

## 2022-05-05 ENCOUNTER — Ambulatory Visit (HOSPITAL_COMMUNITY)
Admission: RE | Admit: 2022-05-05 | Discharge: 2022-05-05 | Disposition: A | Discharge status: Home / Self Care | Payer: No Typology Code available for payment source | Source: Ambulatory Visit | Attending: Cardiology | Admitting: Cardiology

## 2022-05-05 VITALS — BP 121/82 | HR 88 | Ht 67.0 in | Wt 159.6 lb

## 2022-05-05 DIAGNOSIS — F1010 Alcohol abuse, uncomplicated: Secondary | ICD-10-CM | POA: Insufficient documentation

## 2022-05-05 DIAGNOSIS — I428 Other cardiomyopathies: Secondary | ICD-10-CM | POA: Diagnosis not present

## 2022-05-05 DIAGNOSIS — Z7901 Long term (current) use of anticoagulants: Secondary | ICD-10-CM | POA: Diagnosis not present

## 2022-05-05 DIAGNOSIS — I5022 Chronic systolic (congestive) heart failure: Secondary | ICD-10-CM | POA: Diagnosis present

## 2022-05-05 DIAGNOSIS — Z87891 Personal history of nicotine dependence: Secondary | ICD-10-CM | POA: Diagnosis not present

## 2022-05-05 DIAGNOSIS — I351 Nonrheumatic aortic (valve) insufficiency: Secondary | ICD-10-CM | POA: Diagnosis not present

## 2022-05-05 DIAGNOSIS — R0902 Hypoxemia: Secondary | ICD-10-CM | POA: Insufficient documentation

## 2022-05-05 DIAGNOSIS — I480 Paroxysmal atrial fibrillation: Secondary | ICD-10-CM | POA: Diagnosis not present

## 2022-05-05 MED ORDER — DIGOXIN 125 MCG PO TABS: 0.1250 mg | ORAL_TABLET | Freq: Every day | ORAL | 3 refills | Status: DC

## 2022-05-05 MED ORDER — CARVEDILOL 3.125 MG PO TABS: 3.1250 mg | ORAL_TABLET | Freq: Two times a day (BID) | ORAL | 3 refills | Status: DC

## 2022-05-05 NOTE — Progress Notes (Signed)
?                 ?Advanced Heart Failure Clinic Note ? ?PCP: [facility]  MS morrison FNP  ?HF Cardiologist: Dr Gala Romney ? ?HPI:  ?Pamela Bryan is a 51 y.o. female with PMH of alcohol use disorder, tobacco use disorder and systolic HF due to NICM. ?  ?Admitted with 02/2021 with acute systolic HF. EF 15-20%. Cath with preserved cardiac output and severely reduced EF. MRI EF 11% concerning for possible myocarditis. Started on GDMT. Counseled to stop drinking alcohol.  ?  ?Echo 07/2021 EF 20% severely dilated RV normal AoV functionally bicuspid mild AS at least moderate AI (reviewed by Dr. Gala Romney).  ?  ?Losartan added at her follow up and she was scheduled for TEE to evaluate AV (? Bicuspid & severe AI) but cancelled as she did not have anyone to take her to procedure. ? ?TEE (08/2021) EF 20% with a severely dilated left ventricle and severe aortic insufficiency.  Right ventricular systolic function is moderately depressed.  ?  ?RHC 10/2021 showed well compensated filling pressures, moderately reduced CO 2/2 severe AI and LV dysfunction. She underwent aortic valve replacement with bioprosthetic valve 10/2021. Hospitalization c/b post-op AF/ALF requiring IV amiodarone and rapid pacing to NSR. Unfortunately went back into AFL, and was started on Eliquis. Carvedilol, Farxiga, Lasix, losartan, spironolactone and ivabradine stopped at discharge, weight 145 lbs. ? ?At 01/08/2022 visit with APP, she was cleared by CVTS to return to work. No exercise limitations, home weights 145-150 pounds. Midodrine was stopped and Farxiga 10 mg was restarted. Digoxin was discontinued with level of 1.0.  ?  ?Recently presented to AHF Clinic for follow up 03/31/2022. Overall was feeling fine. Denied SOB/PND/Orthopnea. Appetite was ok. No fever or chills. Weight at home was 150-153 pounds. Reported taking all medications. Reported taking Lasix 1-2 times per week. She continues to work full time at [employer]. She no longer  smokes or drink alcohol.  ? ?Today she returns to HF clinic for pharmacist medication titration. At last visit with MD, losartan 25 mg daily, spironolactone 12.5 mg daily and digoxin 0.125 mg daily were initiated. Zio patch placed with goal to discontinued AC if no recurrent AF (had post-op AF only). Overall doing well. Denies dizziness, lightheadedness, chest pain or palpitations. No SOB/DOE. Continues to work at [employer] where she walks ~23k steps/day during her 8 hour shift. Weights stable at home - 155-160 lb. Hasn't needed Lasix this week. She notes that she sometimes only needs Lasix ~1x/week and other weeks multiple doses. Drinks mostly water at work, up to two of her 32 oz tumblers. Fluid status has been good. No LEE, PND or orthopnea. BP in clinic 121/82. Home values with systolic in 110s. Reports not missing any medications; took all but her losartan this morning. She asked when she could stop cutting her spironolactone tablets in half. ? ?HF Medications: ?Losartan 25 mg at bedtime ?Spironolactone 12.5 mg daily ?Farxiga 10 mg daily ?Digoxin 0.125 mg daily ?Lasix 40 mg PRN ? ?Has the patient been experiencing any side effects to the medications prescribed?  no ? ?Does the patient have any problems obtaining medications due to transportation or finances?   No; Hospital doctor ? ?Understanding of regimen: good ?Understanding of indications: good ?Potential of compliance: good ?Patient understands to avoid NSAIDs. ?Patient understands to avoid decongestants. ?  ? ?Pertinent Lab Values: ?04/14/2022: Serum creatinine 0.91, BUN 18, Potassium 3.8, Sodium 138 ? ?Vital Signs:  ?Weight: 159.6  lbs (last clinic weight: 150 lbs) ?Blood pressure: 121/82  ?Heart rate: 88  ? ?Assessment/Plan: ?1. Chronic systolic CHF, Biventricular CHF, NICM  ?- Echo (03/12/21): EF 15% severe RV dysfunction. ?- Cath (03/13/21): EF 15% No CAD. Elevated filling pressures. High cardiac output.  ?- cMRI 03/16/2021  EF < 20%, possible  myocaritis.  ?- Possible ETOH CM. TSH normal, UDS negative. ?- Echo (07/30/21): EF 20% severely dilated RV normal AoV functionally bicuspid, mild AS at least moderate AI. ?- TEE (08/2021): EF 20%, severely dilated LV, RV moderately depressed, severe AI. ?- Echo 03/31/22 LVEF 20-25%. LVID < 6. RV ok.  ?- NYHA I. Volume status stable.  ?- Continue Lasix 40 mg PRN ?- Start carvedilol 3.125 mg twice daily ?- Continue Farxiga 10 mg daily. ?- Continue losartan 25 mg daily at bedtime.  ?- Continue spironolactone 12.5 mg daily.  ?- Continue digoxin 0.125 mg daily. ?- Digoxin level in 1 week - instructed patient to not take digoxin that AM ?  ?2. Paroxysmal AFL, post-op ?- Required IV amiodarone and RAP. No recurrence.  ?- Off ivabradine with AFL/AF. ?- Continue Eliquis 5 mg BID. ?-14 day Zio patch results pending -  if no A fib will stop Eliquis.  ?  ?3. Severe aortic insufficiency ?- S/P bioprosthetic AVR 10/2021 ?  ?4. H/o cirrhosis ?- Suggestive on U/S 02/2021. ?  ?5. Alcohol Use disorder: ?- No longer drinking alcohol ?  ?6. Tobacco Use disorder: ?- No longer smoking.  ?  ?7. Night Time Hypoxia ?- Sleep study negative for OSA.  ?- Has night time hypoxia. ?  ? ?Follow up 05/11/22 for digoxin level, then 05/26/22 with APP and BMP. ? ?Laurey Arrow, PharmD ?PGY1 Pharmacy Resident ? ?Kerby Nora, PharmD, BCPS ?Advanced Heart Failure Clinic Pharmacist  ?[phone] ? ? ?

## 2022-05-05 NOTE — Patient Instructions (Signed)
It was a pleasure seeing you today! ? ?MEDICATIONS: ?-We are changing your medications today ?-Start Carvedilol 3.125 mg daily ?-Call if you have questions about your medications. ? ?LABS: ?-We scheduled a lab visit for 05/16 at 9:30a. Please don't take your digoxin before coming in for labs. ? ?NEXT APPOINTMENT: ?Return to clinic in 3 weeks with APP. ? ?In general, to take care of your heart failure: ?-Limit your fluid intake to 2 Liters (half-gallon) per day.   ?-Limit your salt intake to ideally 2-3 grams (2000-3000 mg) per day. ?-Weigh yourself daily and record, and bring that "weight diary" to your next appointment.  (Weight gain of 2-3 pounds in 1 day typically means fluid weight.) ?-The medications for your heart are to help your heart and help you live longer.   ?-Please contact us before stopping any of your heart medications. ? ?Call the clinic at [phone] with questions or to reschedule future appointments. ? ?

## 2022-05-11 ENCOUNTER — Ambulatory Visit (HOSPITAL_COMMUNITY)
Admission: RE | Admit: 2022-05-11 | Discharge: 2022-05-11 | Disposition: A | Discharge status: Home / Self Care | Payer: No Typology Code available for payment source | Source: Ambulatory Visit | Attending: Cardiology | Admitting: Cardiology

## 2022-05-11 DIAGNOSIS — I5022 Chronic systolic (congestive) heart failure: Secondary | ICD-10-CM | POA: Diagnosis not present

## 2022-05-11 LAB — DIGOXIN LEVEL: Digoxin Level: 0.6 ng/mL — ABNORMAL LOW (ref 0.8–2.0)

## 2022-05-23 ENCOUNTER — Other Ambulatory Visit (HOSPITAL_COMMUNITY): Payer: Self-pay | Admitting: Family Medicine

## 2022-05-25 NOTE — Progress Notes (Signed)
Advanced Heart Failure Clinic Note   PCP: [facility]  Jon Billings FNP  HF Cardiologist: Dr Gala Romney  HPI: Pamela Bryan is a 51 y.o. female with PMH of alcohol use disorder, tobacco use disorder and systolic HF due to NICM.   Admitted with 3/22 with acute systolic HF. EF 15-20% Cath with preserved cardiac output and severely reduced EF. MRI EF 11% concerning for possible myocarditis. Started on GDMT. Counseled to stop drinking alcohol.   Echo 8/22 EF 20% severely dilated RV normal AoV functionally bicuspid mild AS at least moderate AI (reviewed by Dr. Gala Romney).   Losartan added at her follow up and she was scheduled for TEE to evaluate AV (? Bicuspid & severe AI) but cancelled as she did not have anyone to take her to procedure.  TEE (9/22) EF 20% with a severely dilated left ventricle and severe aortic insufficiency.  Right ventricular systolic function is moderately depressed.   RHC 11/22 showed well compensated filling pressures, moderately reduced CO 2/2 severe AI and LV dysfunction. She underwent aortic valve replacement with bioprosthetic valve 11/22. Hospitalization c/b post-op AF/ALF requiring IV amio and rapid pacing to NSR. Unfortunately went back into AFL, and started on Eliquis. Carvedilol, Farxiga, Lasix, losartan, spiro and ivabradine stopped at discharge, weight 145 lbs.  She returns for HF follow up. Overall feeling fine. Denies SOB/PND/Orthopnea. Appetite ok. No fever or chills. Weight at home 150-153 pounds. Taking all medications. Taking lasix 1-2 times per week. She continues to work full time at [employer]. She no longer smokes or drink alcohol.   Cardiac studies: - RHC (11/22):   RA = 5 RV = 27/6 PA = 27/12 (20) PCW = 15 Fick cardiac output/index = 3.7/2.1 PVR = 1.4 WU Ao sat = 99% PA sat = 66%, 69%   Assessment: 1. Well compensate filling pressure with moderately reduced CO in setting of severe AI and significant LV dysfunction  - TEE (9/22):  EF 20%, severely dilated LV and severe AI, RV moderately depressed.  - Echo (3/22): EF < 20% RV normal  - RHC/LHC 02/2021  Ao = 86/68 (77) LV = 92/25 RA =  4 RV = 36/10 PA = 36/17 (25) PCW = 20 Fick cardiac output/index = 5.4/3.0 PVR = 1.0 WU SVR = 1088 Ao sat = 98% PA sat = 74%, 78% High SVC sat = 83%   Assessment: 1. Normal coronary arteries 2. 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 Elevated filling pressures 4. Cardiac output higher than I expected. Suspect peripheral shunting in setting of probable cirrhosis  - cMRI (02/2021):Marland Kitchen Severely dilated left ventricle with EF 11%, diffuse hypokinesis.  2.  Mildly dilated right ventricle with EF 33%.  3. Abnormal aortic valve, looks functionally bicuspid. By regurgitant fraction, there appears to be severe aortic stenosis. Would consider TEE confirmation.  4. Delayed enhancement imaging shows inferoseptal RV insertion site LGE which tends to be nonspecific and related to volume/pressure overload. However, the lateral wall subepicardial LGE is concerning for possible prior myocarditis (less likely sarcoidosis). It is not in a CAD pattern.  ROS: All systems negative except as listed in HPI, PMH and Problem List.  SH:  Social History   Socioeconomic History   Marital status: Divorced    Spouse name: Not on file   Number of children: 4   Years of education: 16   Highest education level: Bachelor's degree (e.g., BA, AB, BS)  Occupational History   Not on file  Tobacco Use   Smoking status: Former    Packs/day: 1.00    Years: 30.00    Pack years: 30.00    Types: Cigarettes    Quit date: 11/02/2021    Years since quitting: 0.5   Smokeless tobacco: Never  Vaping Use   Vaping Use: Never used  Substance and Sexual Activity   Alcohol use: Yes    Comment: occasional   Drug use: No   Sexual activity: Not Currently  Other Topics Concern   Not on file  Social History Narrative   Not on file   Social Determinants of Health   Financial  Resource Strain: Not on file  Food Insecurity: Not on file  Transportation Needs: Not on file  Physical Activity: Not on file  Stress: Not on file  Social Connections: Not on file  Intimate Partner Violence: Not on file   FH:  Family History  Problem Relation Age of Onset   Valvular heart disease Sister    Past Medical History:  Diagnosis Date   CHF (congestive heart failure) (HCC)    Coronary artery disease    History of hiatal hernia    Hypertension    Current Outpatient Medications  Medication Sig Dispense Refill   aspirin EC 81 MG EC tablet Take 1 tablet (81 mg total) by mouth daily. Swallow whole.     buPROPion (WELLBUTRIN SR) 150 MG 12 hr tablet Take 150 mg by mouth 2 (two) times daily.     carvedilol (COREG) 3.125 MG tablet Take 1 tablet (3.125 mg total) by mouth 2 (two) times daily. 60 tablet 3   dapagliflozin propanediol (FARXIGA) 10 MG TABS tablet Take 1 tablet (10 mg total) by mouth daily before breakfast. 30 tablet 11   digoxin (LANOXIN) 0.125 MG tablet Take 1 tablet (0.125 mg total) by mouth daily. 90 tablet 3   diphenhydrAMINE (BENADRYL) 25 MG tablet Take 25 mg by mouth at bedtime as needed for allergies.     ELIQUIS 5 MG TABS tablet TAKE 1 TABLET BY MOUTH TWICE A DAY 60 tablet 1   furosemide (LASIX) 40 MG tablet Take 1 tablet (40 mg total) by mouth daily as needed for fluid or edema. 15 tablet 3   levonorgestrel (LILETTA, 52 MG,) 19.5 MCG/DAY IUD IUD 1 Intra Uterine Device by Intrauterine route once.     losartan (COZAAR) 25 MG tablet Take 1 tablet (25 mg total) by mouth at bedtime. 30 tablet 3   Multiple Vitamin (MULTIVITAMIN) capsule Take 1 capsule by mouth daily.     Polyethyl Glycol-Propyl Glycol (LUBRICANT EYE DROPS) 0.4-0.3 % SOLN Place 1-2 drops into both eyes 3 (three) times daily as needed (dry/irritated eyes).     spironolactone (ALDACTONE) 25 MG tablet Take 0.5 tablets (12.5 mg total) by mouth daily. 15 tablet 3   No current facility-administered  medications for this visit.   There were no vitals taken for this visit.  Wt Readings from Last 3 Encounters:  05/05/22 72.4 kg (159 lb 9.6 oz)  03/31/22 72.1 kg (159 lb)  01/08/22 70.6 kg (155 lb 9.6 oz)   General:  Well appearing. No resp difficulty HEENT: normal Neck: supple. no JVD. Carotids 2+ bilat; no bruits. No lymphadenopathy or thryomegaly appreciated. Cor: PMI nondisplaced. Regular rate & rhythm. No rubs, gallops or murmurs. Lungs: clear Abdomen: soft, nontender, nondistended. No hepatosplenomegaly. No bruits or masses. Good bowel sounds. Extremities: no cyanosis, clubbing, rash, edema Neuro: alert & orientedx3,  cranial nerves grossly intact. moves all 4 extremities w/o difficulty. Affect pleasant   ASSESSMENT & PLAN: 1. Chronic systolic CHF, Biventricular CHF, NICM  - Echo (03/12/21): EF 15% severe RV dysfunction. - Cath (03/13/21): EF 15% No CAD. Elevated filling pressures. High cardiac output.  - cMRI 03/16/2021  EF < 20%, possible myocaritis.  - Possible ETOH CM. TSH normal, UDS negative. - Echo (07/30/21): EF 20% severely dilated RV normal AoV functionally bicuspid, mild AS at least moderate AI. - TEE (9/22): EF 20%, severely dilated LV, RV moderately depressed, severe AI. - Echo today LVEF 20-25%. LVID < 6. RV ok. Dr Gala Romney reviewed and discussed.  - NYHA I. Volume status stable. Continue lasix as needed. - Consider coreg at next visit.  -Continue  Farxiga 10  mg daily. - Add losartan 25 mg daily at bedtime.  - Add 12.5 mg spiro daily.  - Continue digoxin 0.125 mg daily.  2. Paroxysmal AFL, post-op - Required IV amio and RAP. No recurrence.  - Off ivabradine with AFL/AF. - Continue Eliquis 5 mg bid. -Place 14 days Zio if no A fib will stop eliquis.   3. Severe aortic insufficiency - S/P bioprosthetic AVR 11/22  4. H/o cirrhosis - Suggestive on U/S 3/22.  5. Alcohol Use disorder: - No longer drinking alcohol   6. Tobacco Use disorder: - No longer  smoking.   7. Night Time Hypoxia - Sleep study negative for OSA.  - Has night time hypoxia.   Follow up in 2 weeks for BMET  Follow up in 4 weeks with pharmacy  Follow up in 6 weeks with APP Follow up in 3-4 months with Dr Gala Romney and an ECHO.   Pamela Malta Jaloni Davoli NP-C  05/25/22 8:07 AM  Patient seen and examined with the above-signed Advanced Practice Provider and/or Housestaff. I personally reviewed laboratory data, imaging studies and relevant notes. I independently examined the patient and formulated the important aspects of the plan. I have edited the note to reflect any of my changes or salient points. I have personally discussed the plan with the patient and/or family.   Much improved after recent AVR. NYHA I-early II. Volume status looks great.   Echo today reviewed personally EF about the same 20-25% but LV much smaller.   General:  Well appearing. No resp difficulty HEENT: normal Neck: supple. no JVD. Carotids 2+ bilat; no bruits. No lymphadenopathy or thryomegaly appreciated. Cor: PMI nondisplaced. Regular rate & rhythm. No rubs, gallops or murmurs. Lungs: clear Abdomen: soft, nontender, nondistended. No hepatosplenomegaly. No bruits or masses. Good bowel sounds. Extremities: no cyanosis, clubbing, rash, edema Neuro: alert & orientedx3, cranial nerves grossly intact. moves all 4 extremities w/o difficulty. Affect pleasant  EF still markedly depressed after AVR but LV volume much improved. I remain hopeful that EF will begin to improve some. Otherwise doing well. Agree with continue titration of GDMT.   Will place Zio. If no recurrent AF (had post-op AF only) can stop AC.  Congratulated her on stopping tobacco and ETOH,   Total time spent 35 minutes. Over half that time spent discussing above.    Pamela Ganong, FNP  8:07 AM

## 2022-05-26 ENCOUNTER — Encounter (HOSPITAL_COMMUNITY): Payer: Self-pay

## 2022-05-26 ENCOUNTER — Telehealth (HOSPITAL_COMMUNITY): Payer: Self-pay | Admitting: Surgery

## 2022-05-26 ENCOUNTER — Ambulatory Visit (HOSPITAL_COMMUNITY)
Admission: RE | Admit: 2022-05-26 | Discharge: 2022-05-26 | Disposition: A | Discharge status: Home / Self Care | Payer: No Typology Code available for payment source | Source: Ambulatory Visit | Attending: Internal Medicine | Admitting: Internal Medicine

## 2022-05-26 VITALS — BP 98/70 | HR 93 | Ht 67.0 in | Wt 160.2 lb

## 2022-05-26 DIAGNOSIS — K7469 Other cirrhosis of liver: Secondary | ICD-10-CM | POA: Diagnosis not present

## 2022-05-26 DIAGNOSIS — K7460 Unspecified cirrhosis of liver: Secondary | ICD-10-CM | POA: Insufficient documentation

## 2022-05-26 DIAGNOSIS — I351 Nonrheumatic aortic (valve) insufficiency: Secondary | ICD-10-CM | POA: Insufficient documentation

## 2022-05-26 DIAGNOSIS — Z952 Presence of prosthetic heart valve: Secondary | ICD-10-CM

## 2022-05-26 DIAGNOSIS — Z7901 Long term (current) use of anticoagulants: Secondary | ICD-10-CM | POA: Diagnosis not present

## 2022-05-26 DIAGNOSIS — Z87891 Personal history of nicotine dependence: Secondary | ICD-10-CM | POA: Diagnosis not present

## 2022-05-26 DIAGNOSIS — I428 Other cardiomyopathies: Secondary | ICD-10-CM | POA: Diagnosis not present

## 2022-05-26 DIAGNOSIS — I5022 Chronic systolic (congestive) heart failure: Secondary | ICD-10-CM | POA: Insufficient documentation

## 2022-05-26 DIAGNOSIS — G4734 Idiopathic sleep related nonobstructive alveolar hypoventilation: Secondary | ICD-10-CM

## 2022-05-26 DIAGNOSIS — Z7984 Long term (current) use of oral hypoglycemic drugs: Secondary | ICD-10-CM | POA: Diagnosis not present

## 2022-05-26 DIAGNOSIS — I110 Hypertensive heart disease with heart failure: Secondary | ICD-10-CM | POA: Diagnosis not present

## 2022-05-26 DIAGNOSIS — R5383 Other fatigue: Secondary | ICD-10-CM

## 2022-05-26 DIAGNOSIS — I5082 Biventricular heart failure: Secondary | ICD-10-CM | POA: Diagnosis not present

## 2022-05-26 DIAGNOSIS — Z953 Presence of xenogenic heart valve: Secondary | ICD-10-CM | POA: Diagnosis not present

## 2022-05-26 DIAGNOSIS — R0902 Hypoxemia: Secondary | ICD-10-CM | POA: Diagnosis not present

## 2022-05-26 DIAGNOSIS — I480 Paroxysmal atrial fibrillation: Secondary | ICD-10-CM

## 2022-05-26 DIAGNOSIS — F32A Depression, unspecified: Secondary | ICD-10-CM | POA: Diagnosis not present

## 2022-05-26 DIAGNOSIS — F1011 Alcohol abuse, in remission: Secondary | ICD-10-CM

## 2022-05-26 LAB — BASIC METABOLIC PANEL
Anion gap: 10 (ref 5–15)
BUN: 6 mg/dL (ref 6–20)
CO2: 22 mmol/L (ref 22–32)
Calcium: 9.4 mg/dL (ref 8.9–10.3)
Chloride: 106 mmol/L (ref 98–111)
Creatinine, Ser: 0.76 mg/dL (ref 0.44–1.00)
GFR, Estimated: >60 mL/min (ref >60)
Glucose, Bld: 112 mg/dL — ABNORMAL HIGH (ref 70–99)
Potassium: 3.8 mmol/L (ref 3.5–5.1)
Sodium: 138 mmol/L (ref 135–145)

## 2022-05-26 LAB — TSH: TSH: 1.61 u[IU]/mL (ref 0.350–4.500)

## 2022-05-26 LAB — VITAMIN D 25 HYDROXY (VIT D DEFICIENCY, FRACTURES): Vit D, 25-Hydroxy: 31.76 ng/mL (ref 30–100)

## 2022-05-26 NOTE — Patient Instructions (Signed)
Thank you for coming in today  Labs were done today, if any labs are abnormal the clinic will call you No news is good news   STOP Eliquis  Your physician recommends that you schedule a follow-up appointment in:  Keep follow up with Dr. Haroldine Laws as scheduled   At the [facility], you and your health needs are our priority. As part of our continuing mission to provide you with exceptional heart care, we have created designated Provider Care Teams. These Care Teams include your primary Cardiologist (physician) and Advanced Practice Providers (APPs- Physician Assistants and Nurse Practitioners) who all work together to provide you with the care you need, when you need it.   You may see any of the following providers on your designated Care Team at your next follow up: Dr Glori Bickers Dr Haynes Kerns, NP Lyda Jester, [place] [facility] [place], [place] Audry Riles, PharmD   Please be sure to bring in all your medications bottles to every appointment.   If you have any questions or concerns before your next appointment please send [place] a message through [place] or call our office at [phone].    TO LEAVE A MESSAGE FOR THE NURSE SELECT OPTION 2, PLEASE LEAVE A MESSAGE INCLUDING: YOUR NAME DATE OF BIRTH CALL BACK NUMBER REASON FOR CALL**this is important as we prioritize the call backs  YOU WILL RECEIVE A CALL BACK THE SAME DAY AS LONG AS YOU CALL BEFORE 4:00 PM

## 2022-05-26 NOTE — Telephone Encounter (Signed)
-----   Message from Jacklynn Ganong, [place] sent at 05/26/2022 12:04 PM EDT ----- TSH ok. Vit D is low end of normal. Can start OTC supplement

## 2022-05-26 NOTE — Telephone Encounter (Signed)
I contacted patient to review results and recommendations per provider.  She is aware and agreeable.

## 2022-05-27 ENCOUNTER — Other Ambulatory Visit (HOSPITAL_COMMUNITY): Payer: Self-pay | Admitting: Family Medicine

## 2022-06-03 ENCOUNTER — Other Ambulatory Visit: Payer: Self-pay

## 2022-06-03 ENCOUNTER — Other Ambulatory Visit: Payer: Self-pay | Admitting: Specialist

## 2022-06-03 DIAGNOSIS — Z1231 Encounter for screening mammogram for malignant neoplasm of breast: Secondary | ICD-10-CM

## 2022-06-18 ENCOUNTER — Other Ambulatory Visit: Payer: Self-pay | Admitting: Specialist

## 2022-06-18 DIAGNOSIS — Z1231 Encounter for screening mammogram for malignant neoplasm of breast: Secondary | ICD-10-CM

## 2022-07-05 ENCOUNTER — Ambulatory Visit (HOSPITAL_COMMUNITY)
Admission: RE | Admit: 2022-07-05 | Discharge: 2022-07-05 | Disposition: A | Discharge status: Home / Self Care | Payer: No Typology Code available for payment source | Source: Ambulatory Visit | Attending: Internal Medicine | Admitting: Internal Medicine

## 2022-07-05 ENCOUNTER — Ambulatory Visit (HOSPITAL_BASED_OUTPATIENT_CLINIC_OR_DEPARTMENT_OTHER)
Admission: RE | Admit: 2022-07-05 | Discharge: 2022-07-05 | Disposition: A | Discharge status: Home / Self Care | Payer: No Typology Code available for payment source | Source: Ambulatory Visit | Attending: Internal Medicine | Admitting: Internal Medicine

## 2022-07-05 ENCOUNTER — Encounter (HOSPITAL_COMMUNITY): Payer: Self-pay | Admitting: Internal Medicine

## 2022-07-05 VITALS — BP 104/70 | HR 89 | Wt 158.0 lb

## 2022-07-05 DIAGNOSIS — F1011 Alcohol abuse, in remission: Secondary | ICD-10-CM

## 2022-07-05 DIAGNOSIS — I5022 Chronic systolic (congestive) heart failure: Secondary | ICD-10-CM

## 2022-07-05 DIAGNOSIS — R0902 Hypoxemia: Secondary | ICD-10-CM | POA: Diagnosis not present

## 2022-07-05 DIAGNOSIS — Z79899 Other long term (current) drug therapy: Secondary | ICD-10-CM | POA: Diagnosis not present

## 2022-07-05 DIAGNOSIS — I428 Other cardiomyopathies: Secondary | ICD-10-CM | POA: Diagnosis not present

## 2022-07-05 DIAGNOSIS — Z7901 Long term (current) use of anticoagulants: Secondary | ICD-10-CM | POA: Insufficient documentation

## 2022-07-05 DIAGNOSIS — Z952 Presence of prosthetic heart valve: Secondary | ICD-10-CM

## 2022-07-05 DIAGNOSIS — K7460 Unspecified cirrhosis of liver: Secondary | ICD-10-CM | POA: Diagnosis not present

## 2022-07-05 DIAGNOSIS — R5383 Other fatigue: Secondary | ICD-10-CM | POA: Diagnosis not present

## 2022-07-05 DIAGNOSIS — F1091 Alcohol use, unspecified, in remission: Secondary | ICD-10-CM | POA: Insufficient documentation

## 2022-07-05 DIAGNOSIS — Z87891 Personal history of nicotine dependence: Secondary | ICD-10-CM | POA: Insufficient documentation

## 2022-07-05 DIAGNOSIS — I110 Hypertensive heart disease with heart failure: Secondary | ICD-10-CM | POA: Diagnosis not present

## 2022-07-05 LAB — ECHOCARDIOGRAM COMPLETE
AR max vel: 1.35 cm2
AV Area VTI: 1.36 cm2
AV Area mean vel: 1.41 cm2
AV Mean grad: 4.5 mmHg
AV Peak grad: 8.9 mmHg
Ao pk vel: 1.49 m/s
Calc EF: 28.7 %
S' Lateral: 5.3 cm
Single Plane A2C EF: 23.1 %
Single Plane A4C EF: 33.8 %

## 2022-07-05 LAB — BASIC METABOLIC PANEL
Anion gap: 12 (ref 5–15)
BUN: 9 mg/dL (ref 6–20)
CO2: 22 mmol/L (ref 22–32)
Calcium: 9.2 mg/dL (ref 8.9–10.3)
Chloride: 106 mmol/L (ref 98–111)
Creatinine, Ser: 0.76 mg/dL (ref 0.44–1.00)
GFR, Estimated: >60 mL/min (ref >60)
Glucose, Bld: 92 mg/dL (ref 70–99)
Potassium: 4.2 mmol/L (ref 3.5–5.1)
Sodium: 140 mmol/L (ref 135–145)

## 2022-07-05 MED ORDER — SPIRONOLACTONE 25 MG PO TABS: 25.0000 mg | ORAL_TABLET | Freq: Every day | ORAL | 6 refills | Status: DC

## 2022-07-05 NOTE — Patient Instructions (Signed)
Medication Changes:  STOP Digoxin  Increase Spironolactone to 25 mg (1 tab) Daily  Lab Work:  Labs done today, your results will be available in MyChart, we will contact you for abnormal readings.  Testing/Procedures:  None  Referrals:  None  Special Instructions // Education:  We will work on getting your children scheduled for echocardiograms  Follow-Up in: 6 months (Jan 2024), **PLEASE CALL OUR OFFICE IN NOVEMBER TO SCHEDULE THIS APPOINTMENT  At the [facility], you and your health needs are our priority. We have a designated team specialized in the treatment of Heart Failure. This Care Team includes your primary Heart Failure Specialized Cardiologist (physician), Advanced Practice Providers (APPs- Physician Assistants and Nurse Practitioners), and Pharmacist who all work together to provide you with the care you need, when you need it.   You may see any of the following providers on your designated Care Team at your next follow up:  Dr Arvilla Meres Dr Carron Curie, NP Robbie Lis, [place] [facility] [place], [place] Karle Plumber, PharmD   Please be sure to bring in all your medications bottles to every appointment.   Need to Contact us:  If you have any questions or concerns before your next appointment please send [place] a message through [place] or call our office at [phone].    TO LEAVE A MESSAGE FOR THE NURSE SELECT OPTION 2, PLEASE LEAVE A MESSAGE INCLUDING: YOUR NAME DATE OF BIRTH CALL BACK NUMBER REASON FOR CALL**this is important as we prioritize the call backs  YOU WILL RECEIVE A CALL BACK THE SAME DAY AS LONG AS YOU CALL BEFORE 4:00 PM

## 2022-07-05 NOTE — Progress Notes (Addendum)
Advanced Heart Failure Clinic Note   PCP: [facility]  Jon Billings FNP  HF Cardiologist: Dr Gala Romney  HPI: Pamela Bryan is a 51 y.o. female with PMH of alcohol use disorder, tobacco use disorder and systolic HF due to NICM.   Admitted with 3/22 with acute systolic HF. EF 15-20% Cath with preserved cardiac output and severely reduced EF. MRI EF 11% concerning for possible myocarditis. Started on GDMT. Counseled to stop drinking alcohol.   Echo 8/22 EF 20% severely dilated RV normal AoV functionally bicuspid mild AS at least moderate AI (reviewed by Dr. Gala Romney).   Losartan added at her follow up and she was scheduled for TEE to evaluate AV (? Bicuspid & severe AI) but cancelled as she did not have anyone to take her to procedure.  TEE (9/22) EF 20% with a severely dilated left ventricle and severe aortic insufficiency.  Right ventricular systolic function is moderately depressed.   RHC 11/22 showed well compensated filling pressures, moderately reduced CO 2/2 severe AI and LV dysfunction. She underwent aortic valve replacement with bioprosthetic valve 11/22. Hospitalization c/b post-op AF/ALF requiring IV amio and rapid pacing to NSR. Unfortunately went back into AFL, and started on Eliquis. Carvedilol, Farxiga, Lasix, losartan, spiro and ivabradine stopped at discharge, weight 145 lbs.  Follow up 4/23, doing well. NYHA I, volume ok. Losartan and spiro added. Zio 14 day placed with plans to stop Eliquis if no AF. Echo showed EF 20-25%, LV smaller. Zio 14 day (4/23) showed no AF, rare PACs/PVCs, 2 nonsustained VT runs.  Today she returns for HF follow up.Overall feeling fine. Fatiuge has resolved. Denies SOB/PND/Orthopnea. Appetite ok. No fever or chills. Weight at home  has been stable.  Taking all medications. Working full time at [employer]. Echo today 07/05/22 EF 25-30%  Cardiac studies: - Echo (4/23): EF 20-25%, LV much smaller - Echo (3/22): EF < 20% RV normal  - RHC  (11/22):   RA = 5 RV = 27/6 PA = 27/12 (20) PCW = 15 Fick cardiac output/index = 3.7/2.1 PVR = 1.4 WU Ao sat = 99% PA sat = 66%, 69% Assessment: 1. Well compensate filling pressure with moderately reduced CO in setting of severe AI and significant LV dysfunction  - TEE (9/22): EF 20%, severely dilated LV and severe AI, RV moderately depressed.   RHC/LHC 02/2021  Ao = 86/68 (77) LV = 92/25 RA =  4 RV = 36/10 PA = 36/17 (25) PCW = 20 Fick cardiac output/index = 5.4/3.0 PVR = 1.0 WU SVR = 1088 Ao sat = 98% PA sat = 74%, 78% High SVC sat = 83%   Assessment: 1. Normal coronary arteries 2. 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 Elevated filling pressures 4. Cardiac output higher than I expected. Suspect peripheral shunting in setting of probable cirrhosis  - cMRI (02/2021):Marland Kitchen Severely dilated left ventricle with EF 11%, diffuse hypokinesis.  2.  Mildly dilated right ventricle with EF 33%.  3. Abnormal aortic valve, looks functionally bicuspid. By regurgitant fraction, there appears to be severe aortic stenosis. Would consider TEE confirmation.  4. Delayed enhancement imaging shows inferoseptal RV insertion site LGE which tends to be nonspecific and related to volume/pressure overload. However, the lateral wall subepicardial LGE is concerning for possible prior myocarditis (less likely sarcoidosis). It is not in a CAD pattern.  ROS: All systems negative except as listed in HPI, PMH and Problem List.  SH:  Social History   Socioeconomic History   Marital status: Divorced    Spouse name: Not on file   Number of children: 4   Years of education: 16   Highest education level: Bachelor's degree (e.g., BA, AB, BS)  Occupational History   Not on file  Tobacco Use   Smoking status: Former    Packs/day: 1.00    Years: 30.00    Total pack years: 30.00    Types: Cigarettes    Quit date: 11/02/2021    Years since quitting: 0.6   Smokeless tobacco: Never  Vaping Use   Vaping Use: Never  used  Substance and Sexual Activity   Alcohol use: Yes    Comment: occasional   Drug use: No   Sexual activity: Not Currently  Other Topics Concern   Not on file  Social History Narrative   Not on file   Social Determinants of Health   Financial Resource Strain: Not on file  Food Insecurity: Not on file  Transportation Needs: Not on file  Physical Activity: Not on file  Stress: Not on file  Social Connections: Not on file  Intimate Partner Violence: Not on file   FH:  Family History  Problem Relation Age of Onset   Valvular heart disease Sister    Past Medical History:  Diagnosis Date   CHF (congestive heart failure) (HCC)    Coronary artery disease    History of hiatal hernia    Hypertension    Current Outpatient Medications  Medication Sig Dispense Refill   aspirin EC 81 MG EC tablet Take 1 tablet (81 mg total) by mouth daily. Swallow whole.     buPROPion (WELLBUTRIN SR) 150 MG 12 hr tablet Take 150 mg by mouth 2 (two) times daily.     carvedilol (COREG) 3.125 MG tablet Take 1 tablet (3.125 mg total) by mouth 2 (two) times daily. 60 tablet 3   dapagliflozin propanediol (FARXIGA) 10 MG TABS tablet Take 1 tablet (10 mg total) by mouth daily before breakfast. 30 tablet 11   digoxin (LANOXIN) 0.125 MG tablet Take 1 tablet (0.125 mg total) by mouth daily. 90 tablet 3   diphenhydrAMINE (BENADRYL) 25 MG tablet Take 25 mg by mouth at bedtime as needed for allergies.     furosemide (LASIX) 40 MG tablet TAKE 1 TABLET (40 MG TOTAL) BY MOUTH DAILY AS NEEDED FOR FLUID OR EDEMA. 15 tablet 3   levonorgestrel (LILETTA, 52 MG,) 19.5 MCG/DAY IUD IUD 1 Intra Uterine Device by Intrauterine route once.     losartan (COZAAR) 25 MG tablet Take 1 tablet (25 mg total) by mouth at bedtime. 30 tablet 3   Multiple Vitamin (MULTIVITAMIN) capsule Take 1 capsule by mouth daily.     Polyethyl Glycol-Propyl Glycol (LUBRICANT EYE DROPS) 0.4-0.3 % SOLN Place 1-2 drops into both eyes 3 (three) times daily  as needed (dry/irritated eyes).     spironolactone (ALDACTONE) 25 MG tablet Take 0.5 tablets (12.5 mg total) by mouth daily. 15 tablet 3   No current facility-administered medications for this encounter.   BP 104/70   Pulse 89   Wt 71.7 kg (158 lb)   SpO2 95%   BMI 24.75 kg/m   Wt Readings from Last 3 Encounters:  07/05/22 71.7 kg (158 lb)  05/26/22 72.7 kg (160 lb 3.2 oz)  05/05/22 72.4 kg (159 lb 9.6 oz)   Physical Exam General:  NAD. No resp difficulty HEENT: Normal Neck: Supple. No JVD. Carotids 2+ bilat;  no bruits. No lymphadenopathy or thryomegaly appreciated. Cor: PMI nondisplaced. Regular rate & rhythm. No rubs, gallops or murmurs. Lungs: Clear Abdomen: Soft, nontender, nondistended. No hepatosplenomegaly. No bruits or masses. Good bowel sounds. Extremities: No cyanosis, clubbing, rash, edema Neuro: Alert & oriented x 3, cranial nerves grossly intact. Moves all 4 extremities w/o difficulty. Affect pleasant.  ASSESSMENT & PLAN:  1. Chronic systolic CHF, Biventricular CHF, NICM  - Echo (03/12/21): EF 15% severe RV dysfunction. - Cath (03/13/21): EF 15% No CAD. Elevated filling pressures. High cardiac output.  - cMRI 03/16/2021  EF < 20%, possible myocaritis.  - Possible ETOH CM. TSH normal, UDS negative. - Echo (07/30/21): EF 20% severely dilated RV normal AoV functionally bicuspid, mild AS at least moderate AI. - TEE (9/22): EF 20%, severely dilated LV, RV moderately depressed, severe AI. - Echo (4/23): EF 20-25%. LVIDd  6.5.  RV ok.  - Echo today 07/05/22  reviewed and discussed by Dr Gala Romney. LVIDD now 6.1 (previous 6.5) . Dyssynchrony EF 25-30%  NYHA I. Volume status stable.  - Continue carvedilol 3.125 mg bid  - Continue Farxiga 10 mg daily. - Continue losartan 25 mg qhs.  - Increase spiro to 25 mg daily.  - Check BMET in 7 dats.    2. Paroxysmal AFL, post-op - Required IV amio and RAP. No recurrence.  - Off ivabradine with AFL/AF. - Zio 14 day (4/23) showed no  AF. Eliquis was stopped.   3. Severe aortic insufficiency - S/P bioprosthetic AVR 11/22.  4. H/o cirrhosis - Suggestive on U/S 3/22.  5. Alcohol Use disorder - No longer drinking alcohol   6. Tobacco Use disorder - No longer smoking.   7. Night Time Hypoxia - Sleep study negative for OSA.  - Has night time hypoxia.  8. Fatigue - Resolved.   EKG  Dr Gala Romney discussed and reviewed ECHO. Refer to EP for CRT-D   Amy Clegg NP-C  07/05/22 11:18 AM  Patient seen and examined with the above-signed Advanced Practice Provider and/or Housestaff. I personally reviewed laboratory data, imaging studies and relevant notes. I independently examined the patient and formulated the important aspects of the plan. I have edited the note to reflect any of my changes or salient points. I have personally discussed the plan with the patient and/or family.  Doing well. NYHA I. Echo today reviewed personally. EF 25-30%. AVR stable LV smaller (6.7cm -> 6.2 cm) + septal dyssynchrony   General:  Well appearing. No resp difficulty HEENT: normal Neck: supple. no JVD. Carotids 2+ bilat; no bruits. No lymphadenopathy or thryomegaly appreciated. Cor: PMI nondisplaced. Regular rate & rhythm. No rubs, gallops or murmurs. Lungs: clear Abdomen: soft, nontender, nondistended. No hepatosplenomegaly. No bruits or masses. Good bowel sounds. Extremities: no cyanosis, clubbing, rash, edema Neuro: alert & orientedx3, cranial nerves grossly intact. moves all 4 extremities w/o difficulty. Affect pleasant  Continues to do well. NYHA I. Volume status ok. LV continues to improve slowly. Can stop digoxin. Increase spiro. I d/w EP regarding role of LBBB pacing in setting of septal dyssynchrony and normal QRS - no role.    Arvilla Meres, MD  4:20 PM

## 2022-07-05 NOTE — Progress Notes (Signed)
  Echocardiogram 2D Echocardiogram has been performed.  Delcie Roch 07/05/2022, 10:59 AM

## 2022-07-07 ENCOUNTER — Ambulatory Visit
Admission: RE | Admit: 2022-07-07 | Discharge: 2022-07-07 | Disposition: A | Discharge status: Home / Self Care | Payer: No Typology Code available for payment source | Source: Ambulatory Visit | Attending: Specialist | Admitting: Specialist

## 2022-07-07 DIAGNOSIS — Z1231 Encounter for screening mammogram for malignant neoplasm of breast: Secondary | ICD-10-CM

## 2022-07-08 ENCOUNTER — Encounter (HOSPITAL_COMMUNITY): Payer: Self-pay | Admitting: *Deleted

## 2022-07-08 NOTE — Addendum Note (Signed)
Encounter addended by: Noralee Space, RN on: 07/08/2022 10:21 AM  Actions taken: Clinical Note Signed

## 2022-07-08 NOTE — Progress Notes (Signed)
Per Dr Gala Romney patients kids need screening echos.  Patient has 3 kids:  Pamela Bryan (06/13/03)  Pamela Bryan (04/06/07)  Pamela Bryan (11/15/10)  [name] and Pamela are peds and have Medicaid so echos will have to be obtained through pcp.  Unsure if [name] has Medicaid or other insurance, if Medicaid will have to be ordered through pcp as well  Attempted to call pt several times to discuss and Left message to call back, MyChart message sent with info and to let us know if has other questions/concerns.

## 2022-08-03 ENCOUNTER — Other Ambulatory Visit (HOSPITAL_COMMUNITY): Payer: Self-pay | Admitting: Adult Health

## 2022-08-18 ENCOUNTER — Other Ambulatory Visit (HOSPITAL_COMMUNITY): Payer: Self-pay | Admitting: *Deleted

## 2022-08-22 IMAGING — CR DG CHEST 2V
1 series · 1 of 1 positions shown · non-contrast
Comparison: Radiograph earlier today

CLINICAL DATA: Shortness of breath for 2-3 days.  History of CHF.

EXAM:
CHEST - 2 VIEW

[chest lat]
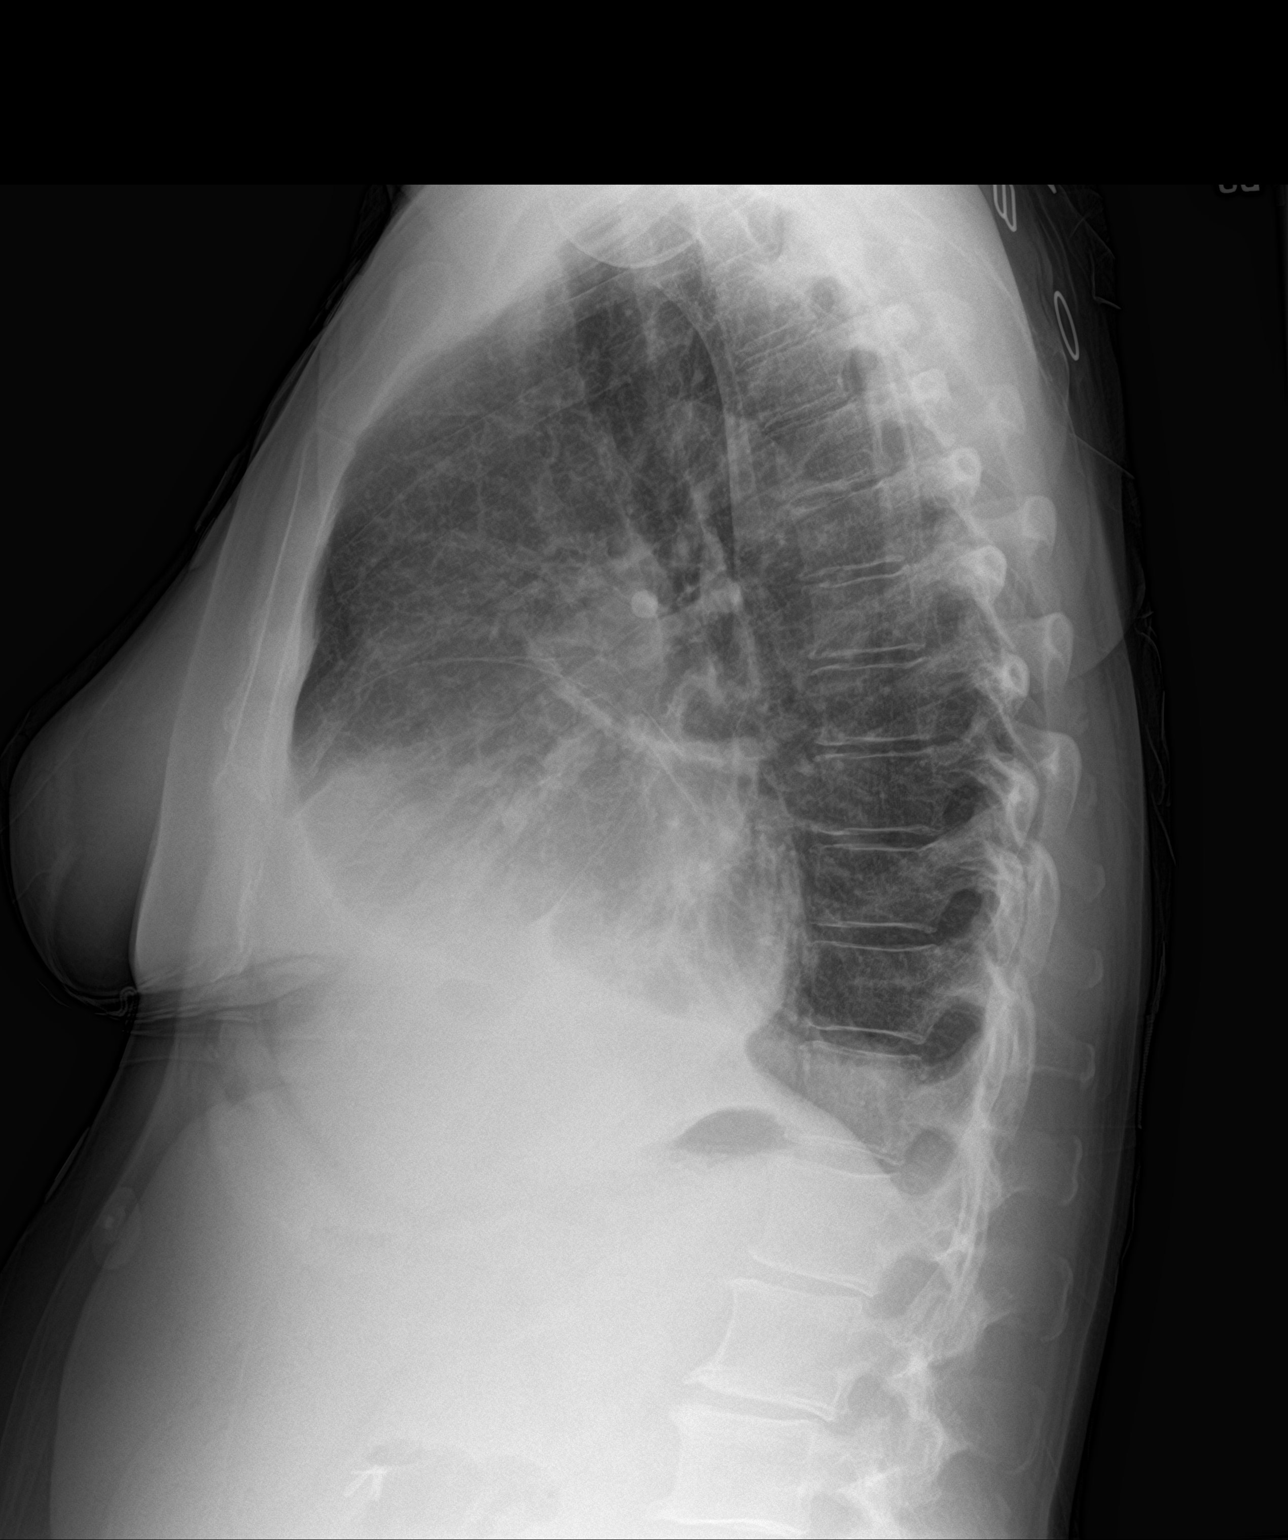

[1 of 1 positions shown; findings below may reference images not displayed]

FINDINGS: Cardiomegaly. Small bilateral pleural effusions are similar. Ovoid
density in the left mid lung likely represents fluid in the fissures
on the lateral view. Developing peribronchial thickening. No
pneumothorax. No acute osseous abnormalities are seen.
IMPRESSION: 1. Developing peribronchial thickening, suspicious for pulmonary
edema.
2. Cardiomegaly and small bilateral pleural effusions, unchanged.
Ovoid density in the left mid lung likely represents fluid in the
fissure.

## 2022-12-02 ENCOUNTER — Other Ambulatory Visit (HOSPITAL_COMMUNITY): Payer: Self-pay | Admitting: Internal Medicine

## 2023-01-04 ENCOUNTER — Other Ambulatory Visit (HOSPITAL_COMMUNITY): Payer: Self-pay | Admitting: Internal Medicine

## 2023-01-17 ENCOUNTER — Other Ambulatory Visit (HOSPITAL_COMMUNITY): Payer: Self-pay | Admitting: Cardiology

## 2023-01-21 ENCOUNTER — Other Ambulatory Visit (HOSPITAL_COMMUNITY): Payer: Self-pay | Admitting: Family Medicine

## 2023-02-18 ENCOUNTER — Other Ambulatory Visit (HOSPITAL_COMMUNITY): Payer: Self-pay | Admitting: Internal Medicine

## 2023-03-17 ENCOUNTER — Other Ambulatory Visit (HOSPITAL_COMMUNITY): Payer: Self-pay | Admitting: Cardiology

## 2023-04-02 ENCOUNTER — Other Ambulatory Visit (HOSPITAL_COMMUNITY): Payer: Self-pay | Admitting: Internal Medicine

## 2023-04-06 ENCOUNTER — Other Ambulatory Visit (HOSPITAL_COMMUNITY): Payer: Self-pay

## 2023-04-07 ENCOUNTER — Other Ambulatory Visit (HOSPITAL_COMMUNITY): Payer: Self-pay

## 2023-04-07 ENCOUNTER — Telehealth (HOSPITAL_COMMUNITY): Payer: Self-pay

## 2023-04-07 NOTE — Telephone Encounter (Signed)
Patient Advocate Encounter  Patient called in stating that the pharmacy is having trouble processing claims.  Review of the chart shows that the primary insurance has been terminated and the secondary insurance will not cover claims as the primary payor.  Patient will need to get primary insurance reinstated, or will need to contact secondary plan to have them update as the solo insurance coverage.  Left voicemail for patient to call back.  Burnell Blanks, CPhT Rx Patient Advocate Phone: [phone]

## 2023-04-07 NOTE — Telephone Encounter (Signed)
Patient Advocate Encounter  Spoke to patient, she has contacted [employer] and started the process of correcting the billing to a single payor. Samples provided until insurance can update.  Medication Samples have been left at registration desk for patient pick up. Drug name: Pamela Bryan 10MG  Qty: 4x 7 ct package LOT: [id number] Exp.: 08/26/2025 SIG: Take 1 tablet by mouth once daily   The patient has been instructed regarding the correct time, dose, and frequency of taking this medication, including desired effects and most common side effects.

## 2023-04-14 ENCOUNTER — Other Ambulatory Visit (HOSPITAL_COMMUNITY): Payer: Self-pay

## 2023-04-14 NOTE — Telephone Encounter (Signed)
Patient Advocate Encounter  Test claims are still returning COB rejection. Pt is aware that this process may take up to 1 month. Will continue to follow up.

## 2023-04-17 IMAGING — DX DG CHEST 1V PORT
1 series · 1 of 1 positions shown · non-contrast
Comparison: 11/03/2021

CLINICAL DATA: Sore chest

EXAM:
PORTABLE CHEST 1 VIEW

[chest]
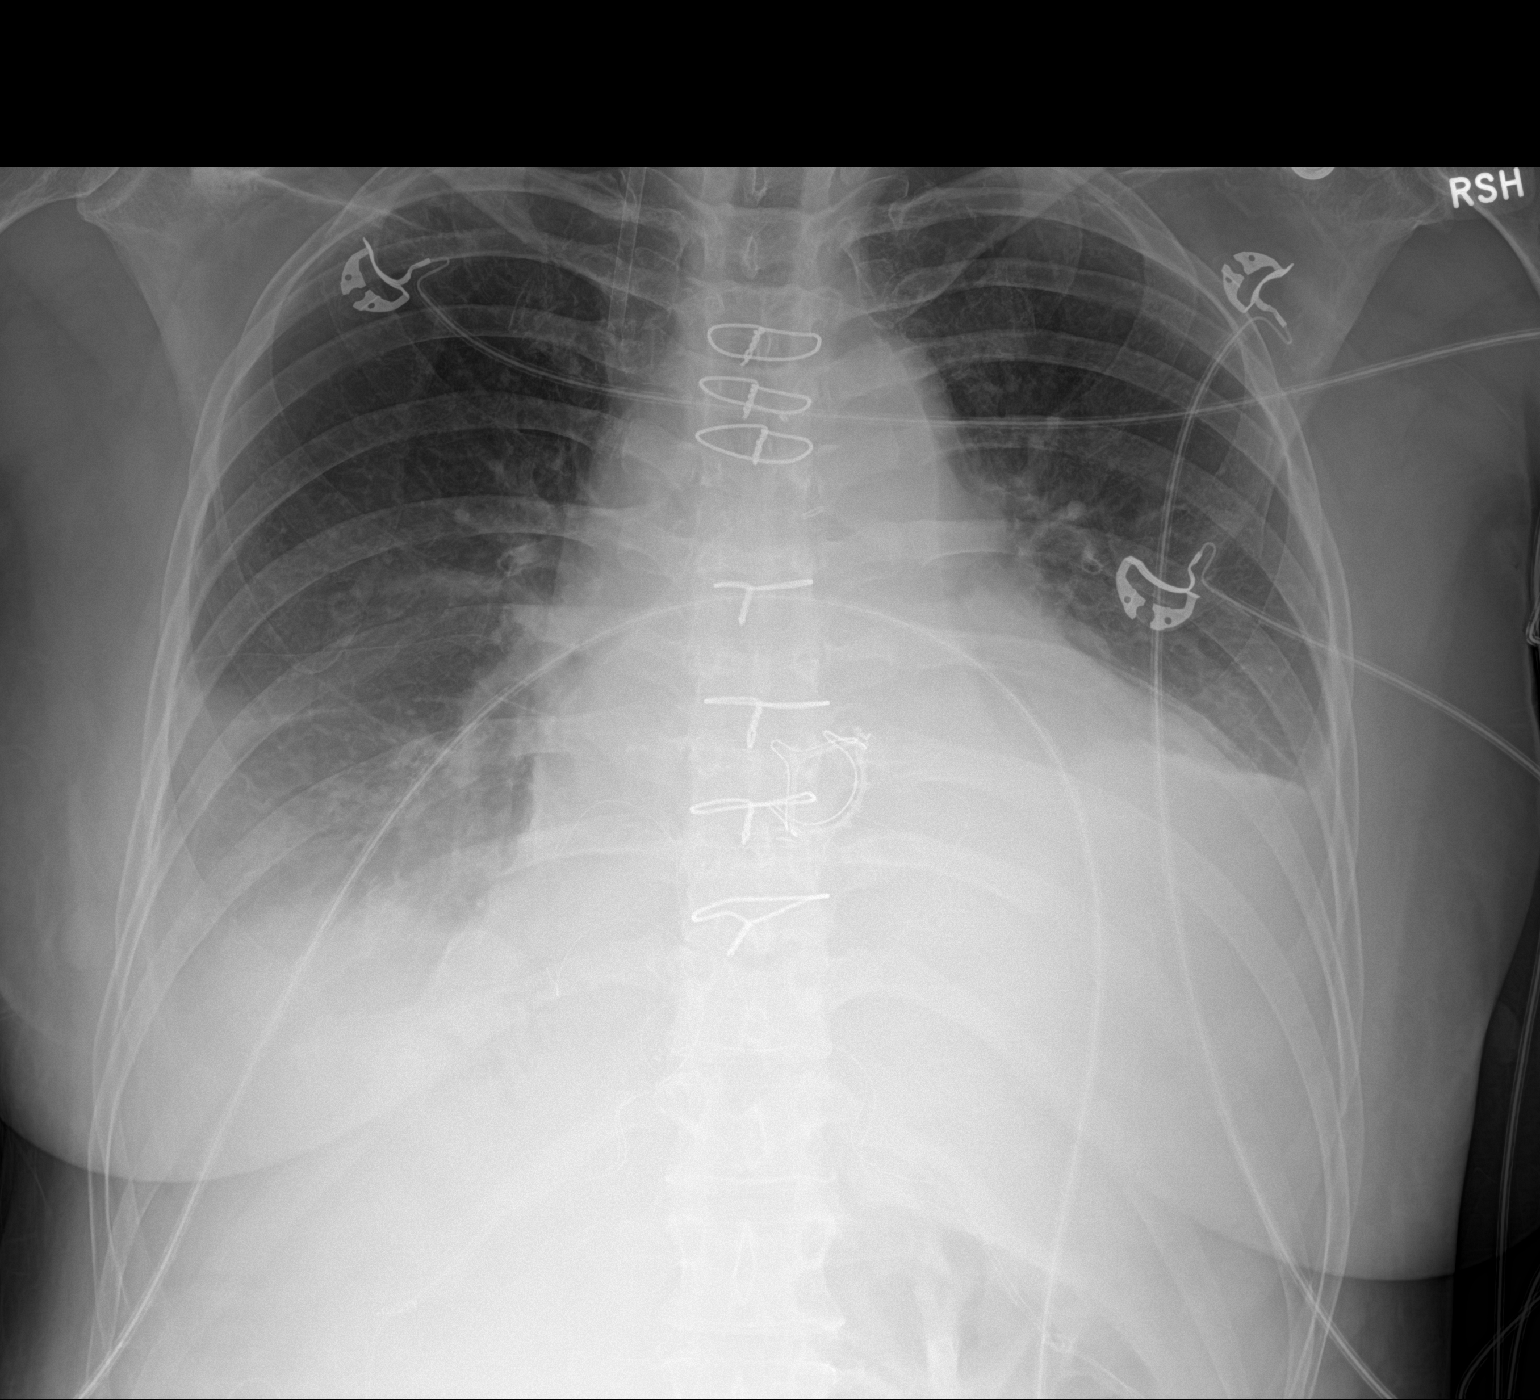

[1 of 1 positions shown; findings below may reference images not displayed]

FINDINGS: Removal of mediastinal drain and Swan-Ganz catheter. Central venous
sheath remains. Midline sternotomy and prosthetic bowel noted.

LEFT lower lobe atelectasis is unchanged. Mild bibasilar venous
congestion similar. No pneumothorax
IMPRESSION: 1. Persistent LEFT lower lobe atelectasis and lower lobe venous
congestion.
2. No pneumothorax or pulmonary edema

## 2023-04-18 ENCOUNTER — Other Ambulatory Visit (HOSPITAL_COMMUNITY): Payer: Self-pay

## 2023-04-19 ENCOUNTER — Other Ambulatory Visit (HOSPITAL_COMMUNITY): Payer: Self-pay | Admitting: Internal Medicine

## 2023-04-19 IMAGING — DX DG CHEST 1V PORT
1 series · 1 of 1 positions shown · non-contrast
Comparison: Prior chest x-ray 11/04/2021

CLINICAL DATA: Chest tube removed

EXAM:
PORTABLE CHEST 1 VIEW

[chest]
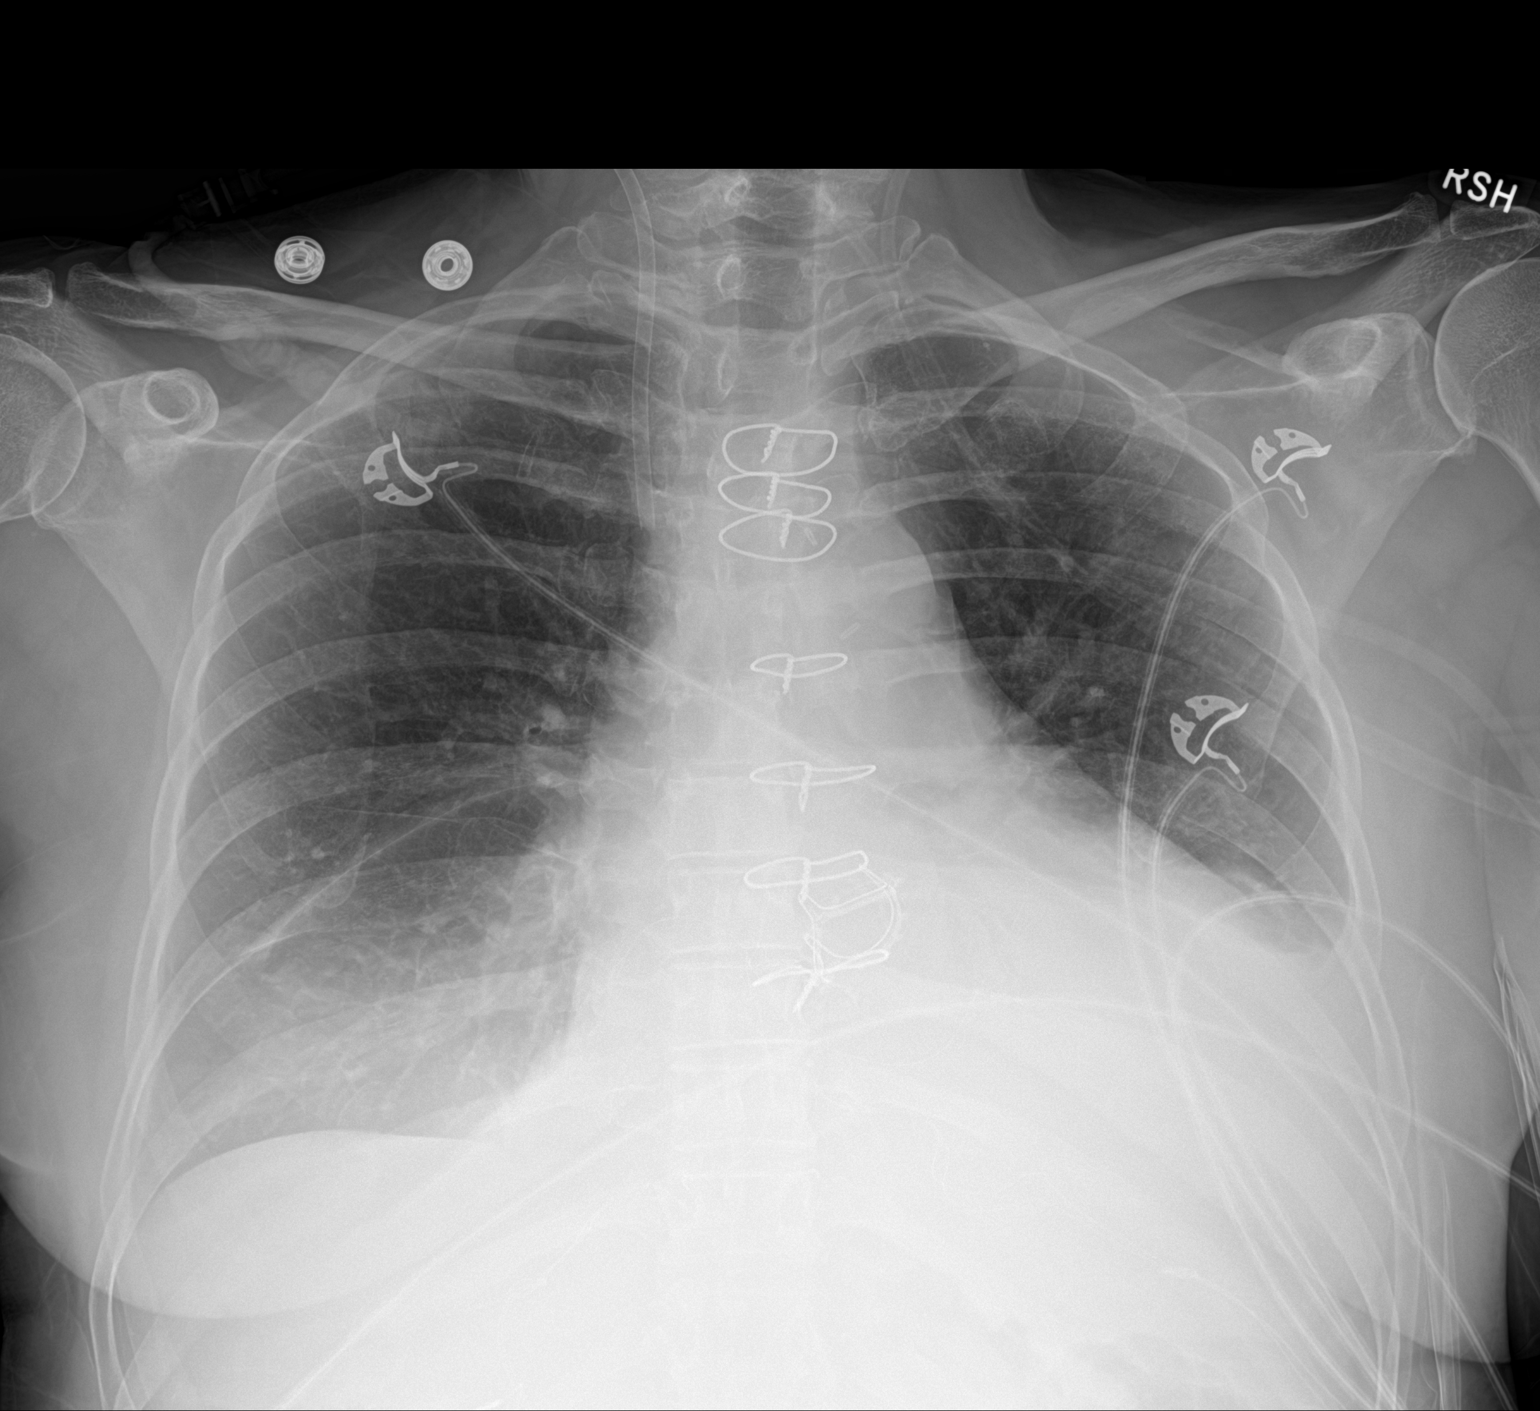

[1 of 1 positions shown; findings below may reference images not displayed]

FINDINGS: Patient is status post median sternotomy for aortic valve
replacement. Right IJ Cordis sheath remains in place. No evidence of
pneumothorax. Persistent left basilar airspace opacity which may
reflect atelectasis or pleural effusion. Lungs are otherwise clear.
Inspiratory volumes remain low. No pneumothorax.
IMPRESSION: 1. No evidence of pneumothorax.
2. Persistent left basilar opacity which may reflect pleural fluid
or atelectasis.
3. Mild right basilar atelectasis.

## 2023-04-22 ENCOUNTER — Other Ambulatory Visit (HOSPITAL_COMMUNITY): Payer: Self-pay

## 2023-04-25 ENCOUNTER — Other Ambulatory Visit (HOSPITAL_COMMUNITY): Payer: Self-pay

## 2023-04-25 ENCOUNTER — Other Ambulatory Visit: Payer: Self-pay

## 2023-04-25 MED ORDER — SPIRONOLACTONE 25 MG PO TABS
25.0000 mg | ORAL_TABLET | Freq: Every day | ORAL | 0 refills | Status: DC
  Filled 2023-04-25: qty 30, 30d supply, fill #0

## 2023-04-25 NOTE — Telephone Encounter (Signed)
Patient Advocate Encounter  Spoke to patient by phone. Claims are still rejecting and patient is running low on supply of Spironolactone. I have reached out to have a 30 day supply filled with [facility], as this medication is on the $5 list. Patient and I will both continue following up until insurance is able to be successfully processed.

## 2023-04-25 NOTE — Telephone Encounter (Signed)
Meds ordered this encounter  Medications   spironolactone (ALDACTONE) 25 MG tablet    Sig: Take 1 tablet (25 mg total) by mouth daily.    Dispense:  30 tablet    Refill:  0

## 2023-05-02 ENCOUNTER — Other Ambulatory Visit (HOSPITAL_COMMUNITY): Payer: Self-pay

## 2023-05-02 NOTE — Telephone Encounter (Signed)
Patient Advocate Encounter  Test claims still return COB rejection. Will continue to follow up.

## 2023-05-09 ENCOUNTER — Other Ambulatory Visit (HOSPITAL_COMMUNITY): Payer: Self-pay

## 2023-05-12 ENCOUNTER — Other Ambulatory Visit (HOSPITAL_COMMUNITY): Payer: Self-pay

## 2023-05-13 ENCOUNTER — Other Ambulatory Visit (HOSPITAL_COMMUNITY): Payer: Self-pay

## 2023-05-15 IMAGING — DX DG CHEST 2V
2 series · 2 of 2 positions shown · non-contrast
Comparison: Chest x-ray 11/06/2021.

CLINICAL DATA: Status post AVR 4 weeks ago.

EXAM:
CHEST - 2 VIEW

[dg chest 2 view (1 of 2)]
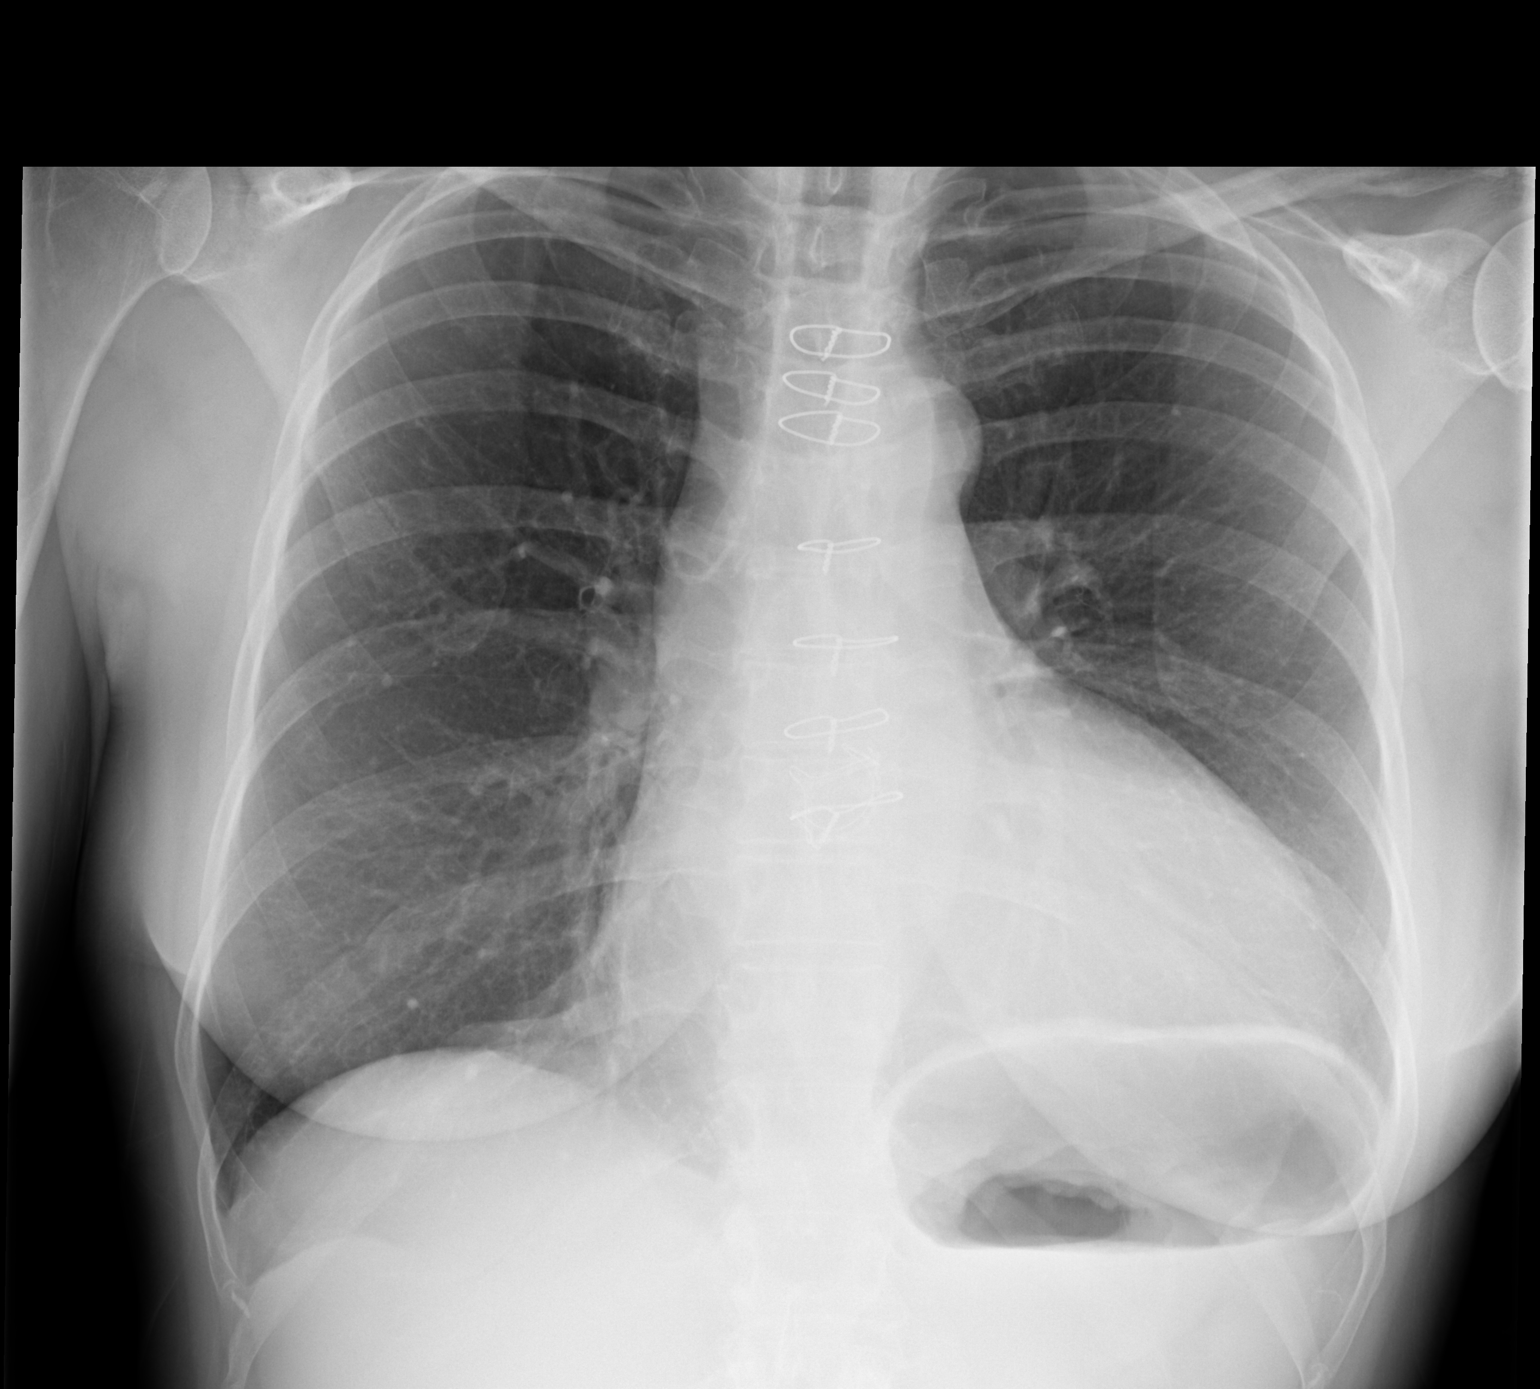

[dg chest 2 view (2 of 2)]
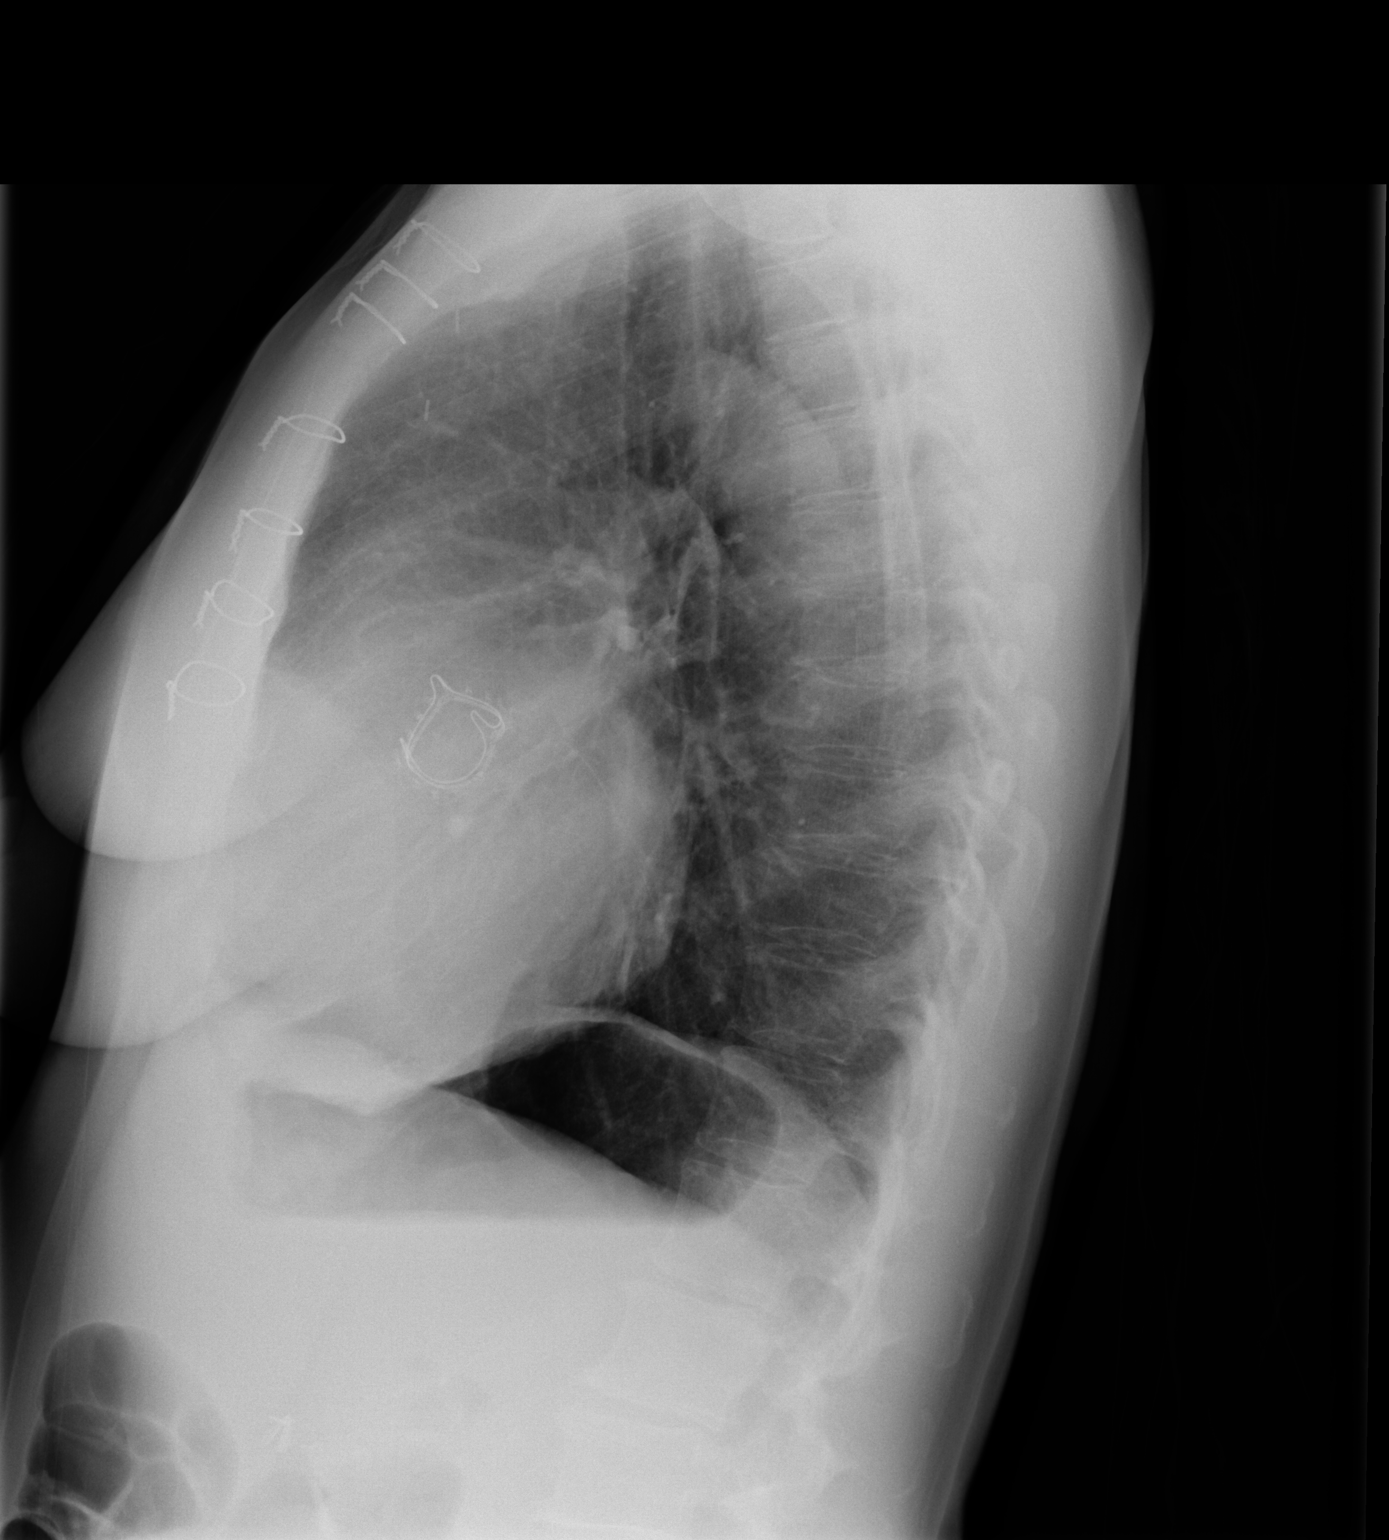

[2 of 2 positions shown; findings below may reference images not displayed]

FINDINGS: Sternotomy wires and heart valve are again seen. The heart is
enlarged, unchanged. There is no focal lung infiltrate, pleural
effusion or pneumothorax. Calcified nodules in the right lung are
unchanged, likely calcified granulomas. No acute fractures are seen.
IMPRESSION: Stable cardiomegaly.

No acute cardiopulmonary process.

## 2023-05-16 ENCOUNTER — Other Ambulatory Visit (HOSPITAL_COMMUNITY): Payer: Self-pay

## 2023-05-16 NOTE — Telephone Encounter (Signed)
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 Representative confirmed that test claims are still returning 'submit to primary coverage' rejection. This rep has advised me to have the patient contact member services at [phone] to follow up, as it has been over 30 days since the request was made.  Left voicemail for patient with updated information. Will continue to follow up.

## 2023-05-17 ENCOUNTER — Other Ambulatory Visit (HOSPITAL_COMMUNITY): Payer: Self-pay

## 2023-05-19 ENCOUNTER — Other Ambulatory Visit (HOSPITAL_COMMUNITY): Payer: Self-pay

## 2023-05-19 NOTE — Telephone Encounter (Signed)
Advanced Heart Failure Patient Advocate Encounter  Test claims still return COB rejection. Will continue to follow up.

## 2023-05-24 ENCOUNTER — Other Ambulatory Visit (HOSPITAL_COMMUNITY): Payer: Self-pay

## 2023-05-24 NOTE — Telephone Encounter (Signed)
Advanced Heart Failure Patient Advocate Encounter  Test claims still return COB rejection. Will continue to follow up.  

## 2023-05-25 ENCOUNTER — Other Ambulatory Visit (HOSPITAL_COMMUNITY): Payer: Self-pay

## 2023-05-30 ENCOUNTER — Other Ambulatory Visit (HOSPITAL_COMMUNITY): Payer: Self-pay

## 2023-05-30 NOTE — Telephone Encounter (Signed)
Advanced Heart Failure Patient Advocate Encounter  Issue resolved. Test claims returning with refill too soon rejection, patient was able to fill medications. Nothing further needed at this time.

## 2023-06-05 ENCOUNTER — Other Ambulatory Visit (HOSPITAL_COMMUNITY): Payer: Self-pay | Admitting: Internal Medicine

## 2023-06-10 ENCOUNTER — Other Ambulatory Visit (HOSPITAL_COMMUNITY): Payer: Self-pay | Admitting: Cardiology

## 2023-06-29 ENCOUNTER — Other Ambulatory Visit (HOSPITAL_COMMUNITY): Payer: Self-pay | Admitting: Internal Medicine

## 2023-07-26 ENCOUNTER — Encounter (HOSPITAL_COMMUNITY): Payer: No Typology Code available for payment source

## 2023-10-02 ENCOUNTER — Other Ambulatory Visit (HOSPITAL_COMMUNITY): Payer: Self-pay | Admitting: Cardiology

## 2023-10-02 ENCOUNTER — Other Ambulatory Visit (HOSPITAL_COMMUNITY): Payer: Self-pay | Admitting: Internal Medicine

## 2024-01-05 ENCOUNTER — Other Ambulatory Visit (HOSPITAL_COMMUNITY): Payer: Self-pay | Admitting: Internal Medicine

## 2024-02-08 ENCOUNTER — Other Ambulatory Visit (HOSPITAL_COMMUNITY): Payer: Self-pay | Admitting: Internal Medicine

## 2024-02-11 ENCOUNTER — Other Ambulatory Visit (HOSPITAL_COMMUNITY): Payer: Self-pay | Admitting: Family Medicine

## 2024-03-12 NOTE — Progress Notes (Incomplete)
 Advanced Heart Failure Clinic Note   PCP: [facility]  Jon Billings FNP  HF Cardiologist: Dr Gala Romney  HPI: Pamela Bryan is a 53 y.o. female with PMH of alcohol use disorder, tobacco use disorder and systolic HF due to NICM.   Admitted with 3/22 with acute systolic HF. EF 15-20% Cath with preserved cardiac output and severely reduced EF. MRI EF 11% concerning for possible myocarditis. Started on GDMT. Counseled to stop drinking alcohol.   Echo 8/22 EF 20% severely dilated RV normal AoV functionally bicuspid mild AS at least moderate AI (reviewed by Dr. Gala Romney).   TEE (9/22) EF 20% with a severely dilated left ventricle and severe aortic insufficiency.  Right ventricular systolic function is moderately depressed.   RHC 11/22 showed well compensated filling pressures, moderately reduced CO 2/2 severe AI and LV dysfunction. She underwent aortic valve replacement with bioprosthetic valve 11/22. Hospitalization c/b post-op AF/ALF requiring IV amio and rapid pacing to NSR. Unfortunately went back into AFL, and started on Eliquis.    Echo 04/23: EF 20-25%, LV smaller.   Zio 14 day (4/23) showed no AF, rare PACs/PVCs, 2 nonsustained VT runs.  Echo 07/05/22 EF 25-30%    ROS: All systems negative except as listed in HPI, PMH and Problem List.  SH:  Social History   Socioeconomic History   Marital status: Divorced    Spouse name: Not on file   Number of children: 4   Years of education: 16   Highest education level: Bachelor's degree (e.g., BA, AB, BS)  Occupational History   Not on file  Tobacco Use   Smoking status: Former    Current packs/day: 0.00    Average packs/day: 1 pack/day for 30.0 years (30.0 ttl pk-yrs)    Types: Cigarettes    Start date: 11/03/1991    Quit date: 11/02/2021    Years since quitting: 2.3   Smokeless tobacco: Never  Vaping Use   Vaping status: Never Used  Substance and Sexual Activity   Alcohol use: Yes    Comment: occasional   Drug  use: No   Sexual activity: Not Currently  Other Topics Concern   Not on file  Social History Narrative   Not on file   Social Drivers of Health   Financial Resource Strain: Not on file  Food Insecurity: Not on file  Transportation Needs: Not on file  Physical Activity: Not on file  Stress: Not on file  Social Connections: Unknown (05/10/2022)   Received from [facility]   Social Network    Social Network: Not on file  Intimate Partner Violence: Unknown (04/01/2022)   Received from [employer], [facility]   HITS    Physically Hurt: Not on file    Insult or Talk Down To: Not on file    Threaten Physical Harm: Not on file    Scream or Curse: Not on file   FH:  Family History  Problem Relation Age of Onset   Valvular heart disease Sister    Past Medical History:  Diagnosis Date   CHF (congestive heart failure) (HCC)    Coronary artery disease    History of hiatal hernia    Hypertension    Current Outpatient Medications  Medication Sig Dispense Refill   aspirin EC 81 MG EC tablet Take 1 tablet (81 mg total) by mouth daily. Swallow whole.     buPROPion [facility]  SR) 150 MG 12 hr tablet Take 150 mg by mouth 2 (two) times daily.     carvedilol (COREG) 3.125 MG tablet TAKE 1 TABLET BY MOUTH 2 TIMES DAILY. NEEDS FOLLOW UP APPOINTMENT FOR MORE REFILLS 60 tablet 3   dapagliflozin propanediol (FARXIGA) 10 MG TABS tablet Take 1 tablet (10 mg total) by mouth daily. NEEDS FOLLOW UP APPOINTMENT FOR MORE REFILLS 30 tablet 0   diphenhydrAMINE (BENADRYL) 25 MG tablet Take 25 mg by mouth at bedtime as needed for allergies.     furosemide (LASIX) 40 MG tablet TAKE 1 TABLET (40 MG TOTAL) BY MOUTH DAILY AS NEEDED FOR FLUID OR EDEMA. 15 tablet 3   levonorgestrel (LILETTA, 52 MG,) 19.5 MCG/DAY IUD IUD 1 Intra Uterine Device by Intrauterine route once.     losartan (COZAAR) 25 MG tablet TAKE 1 TABLET (25 MG TOTAL) BY MOUTH DAILY. NEEDS FOLLOW UP APPOINTMENT FOR MORE REFILLS  30 tablet 0   Multiple Vitamin (MULTIVITAMIN) capsule Take 1 capsule by mouth daily.     Polyethyl Glycol-Propyl Glycol (LUBRICANT EYE DROPS) 0.4-0.3 % SOLN Place 1-2 drops into both eyes 3 (three) times daily as needed (dry/irritated eyes).     spironolactone (ALDACTONE) 25 MG tablet TAKE 1 TABLET (25 MG TOTAL) BY MOUTH DAILY. 30 tablet 6   No current facility-administered medications for this visit.   There were no vitals taken for this visit.  Wt Readings from Last 3 Encounters:  07/05/22 71.7 kg (158 lb)  05/26/22 72.7 kg (160 lb 3.2 oz)  05/05/22 72.4 kg (159 lb 9.6 oz)   Physical Exam General:  NAD. No resp difficulty HEENT: Normal Neck: Supple. No JVD. Carotids 2+ bilat; no bruits. No lymphadenopathy or thryomegaly appreciated. Cor: PMI nondisplaced. Regular rate & rhythm. No rubs, gallops or murmurs. Lungs: Clear Abdomen: Soft, nontender, nondistended. No hepatosplenomegaly. No bruits or masses. Good bowel sounds. Extremities: No cyanosis, clubbing, rash, edema Neuro: Alert & oriented x 3, cranial nerves grossly intact. Moves all 4 extremities w/o difficulty. Affect pleasant.  ASSESSMENT & PLAN:  1. Chronic systolic CHF, Biventricular CHF, NICM  - Echo (03/12/21): EF 15% severe RV dysfunction. - Cath (03/13/21): EF 15% No CAD. Elevated filling pressures. High cardiac output.  - cMRI 03/16/2021  EF < 20%, possible myocaritis.  - Possible ETOH CM. TSH normal, UDS negative. - Echo (07/30/21): EF 20% severely dilated RV normal AoV functionally bicuspid, mild AS at least moderate AI. - TEE (9/22): EF 20%, severely dilated LV, RV moderately depressed, severe AI. - Echo (4/23): EF 20-25%. LVIDd  6.5.  RV ok.  - Echo today 07/05/22  reviewed and discussed by Dr Gala Romney. LVIDD now 6.1 (previous 6.5) . Dyssynchrony EF 25-30%  NYHA I. Volume status stable.  - Continue carvedilol 3.125 mg bid  - Continue Farxiga 10 mg daily. - Continue losartan 25 mg qhs.  - Increase spiro to 25 mg  daily.  - Check BMET in 7 dats.   2. Paroxysmal AFL, post-op - Required IV amio and RAP. No recurrence.  - Off ivabradine with AFL/AF. - Zio 14 day (4/23) showed no AF. Eliquis was stopped.   3. Severe aortic insufficiency - S/P bioprosthetic AVR 11/22.  4. H/o cirrhosis - Suggestive on U/S 3/22.  5. Alcohol Use disorder - No longer drinking alcohol   6. Tobacco Use disorder - No longer smoking.   7. Night Time Hypoxia - Sleep study negative for OSA.  - Has night time hypoxia.  Follow-up:   [facility], Alanna Storti  N PA-C 03/12/24 4:28 PM

## 2024-03-13 ENCOUNTER — Other Ambulatory Visit (HOSPITAL_COMMUNITY): Payer: Self-pay

## 2024-03-13 ENCOUNTER — Ambulatory Visit (HOSPITAL_COMMUNITY)
Admission: RE | Admit: 2024-03-13 | Discharge: 2024-03-13 | Disposition: A | Discharge status: Home / Self Care | Source: Ambulatory Visit | Attending: Physician Assistant | Admitting: Physician Assistant

## 2024-03-13 ENCOUNTER — Telehealth (HOSPITAL_COMMUNITY): Payer: Self-pay

## 2024-03-13 ENCOUNTER — Encounter (HOSPITAL_COMMUNITY): Payer: Self-pay

## 2024-03-13 VITALS — BP 130/80 | HR 80 | Wt 167.2 lb

## 2024-03-13 DIAGNOSIS — I110 Hypertensive heart disease with heart failure: Secondary | ICD-10-CM | POA: Diagnosis not present

## 2024-03-13 DIAGNOSIS — Z87891 Personal history of nicotine dependence: Secondary | ICD-10-CM | POA: Diagnosis not present

## 2024-03-13 DIAGNOSIS — I428 Other cardiomyopathies: Secondary | ICD-10-CM | POA: Insufficient documentation

## 2024-03-13 DIAGNOSIS — Z953 Presence of xenogenic heart valve: Secondary | ICD-10-CM | POA: Insufficient documentation

## 2024-03-13 DIAGNOSIS — K7460 Unspecified cirrhosis of liver: Secondary | ICD-10-CM | POA: Diagnosis not present

## 2024-03-13 DIAGNOSIS — Z79899 Other long term (current) drug therapy: Secondary | ICD-10-CM | POA: Diagnosis not present

## 2024-03-13 DIAGNOSIS — Z7984 Long term (current) use of oral hypoglycemic drugs: Secondary | ICD-10-CM | POA: Insufficient documentation

## 2024-03-13 DIAGNOSIS — I351 Nonrheumatic aortic (valve) insufficiency: Secondary | ICD-10-CM | POA: Insufficient documentation

## 2024-03-13 DIAGNOSIS — G4734 Idiopathic sleep related nonobstructive alveolar hypoventilation: Secondary | ICD-10-CM | POA: Diagnosis not present

## 2024-03-13 DIAGNOSIS — I5082 Biventricular heart failure: Secondary | ICD-10-CM | POA: Insufficient documentation

## 2024-03-13 DIAGNOSIS — I4892 Unspecified atrial flutter: Secondary | ICD-10-CM | POA: Diagnosis not present

## 2024-03-13 LAB — BASIC METABOLIC PANEL
Anion gap: 13 (ref 5–15)
BUN: 13 mg/dL (ref 6–20)
CO2: 19 mmol/L — ABNORMAL LOW (ref 22–32)
Calcium: 9.5 mg/dL (ref 8.9–10.3)
Chloride: 104 mmol/L (ref 98–111)
Creatinine, Ser: 0.76 mg/dL (ref 0.44–1.00)
GFR, Estimated: >60 mL/min (ref >60)
Glucose, Bld: 86 mg/dL (ref 70–99)
Potassium: 4.4 mmol/L (ref 3.5–5.1)
Sodium: 136 mmol/L (ref 135–145)

## 2024-03-13 MED ORDER — ENTRESTO 24-26 MG PO TABS: 1.0000 | ORAL_TABLET | Freq: Two times a day (BID) | ORAL | 11 refills | Status: AC

## 2024-03-13 MED ORDER — LOSARTAN POTASSIUM 25 MG PO TABS: 25.0000 mg | ORAL_TABLET | Freq: Every day | ORAL | 3 refills | Status: DC

## 2024-03-13 MED ORDER — FUROSEMIDE 40 MG PO TABS: 40.0000 mg | ORAL_TABLET | Freq: Every day | ORAL | 3 refills | Status: DC | PRN

## 2024-03-13 MED ORDER — DAPAGLIFLOZIN PROPANEDIOL 10 MG PO TABS: 10.0000 mg | ORAL_TABLET | Freq: Every day | ORAL | 3 refills | Status: AC

## 2024-03-13 MED ORDER — DAPAGLIFLOZIN PROPANEDIOL 10 MG PO TABS: 10.0000 mg | ORAL_TABLET | Freq: Every day | ORAL | 3 refills | Status: DC

## 2024-03-13 MED ORDER — FUROSEMIDE 40 MG PO TABS: 40.0000 mg | ORAL_TABLET | Freq: Every day | ORAL | 3 refills | Status: AC | PRN

## 2024-03-13 NOTE — Telephone Encounter (Signed)
 Advanced Heart Failure Patient Advocate Encounter  Patient being started on Entresto. Test billing shows refill too soon rejection, but based on the plan information available there should be a $4 copay for 30 or 90 days, and this medication does not require prior authorization.  Burnell Blanks, CPhT Rx Patient Advocate Phone: [phone]

## 2024-03-13 NOTE — Patient Instructions (Addendum)
 Stop Losartan.  Start Entresto 24/26 mg twice daily - Rx sent. Labs today - will call you if abnormal. Repeat labs in 2 weeks - see below. Return to see Dr. Gala Romney in 2 months with echo. See below. Please call us at [phone] if any questions or concerns prior to your next visit.

## 2024-03-15 ENCOUNTER — Other Ambulatory Visit (HOSPITAL_COMMUNITY): Payer: Self-pay

## 2024-03-28 ENCOUNTER — Ambulatory Visit (HOSPITAL_COMMUNITY)
Admission: RE | Admit: 2024-03-28 | Discharge: 2024-03-28 | Disposition: A | Discharge status: Home / Self Care | Source: Ambulatory Visit | Attending: Cardiology | Admitting: Cardiology

## 2024-03-28 DIAGNOSIS — I5082 Biventricular heart failure: Secondary | ICD-10-CM | POA: Diagnosis present

## 2024-03-28 LAB — BASIC METABOLIC PANEL WITH GFR
Anion gap: 11 (ref 5–15)
BUN: 14 mg/dL (ref 6–20)
CO2: 23 mmol/L (ref 22–32)
Calcium: 9.1 mg/dL (ref 8.9–10.3)
Chloride: 103 mmol/L (ref 98–111)
Creatinine, Ser: 0.92 mg/dL (ref 0.44–1.00)
GFR, Estimated: >60 mL/min (ref >60)
Glucose, Bld: 102 mg/dL — ABNORMAL HIGH (ref 70–99)
Potassium: 5.4 mmol/L — ABNORMAL HIGH (ref 3.5–5.1)
Sodium: 137 mmol/L (ref 135–145)

## 2024-04-04 ENCOUNTER — Ambulatory Visit (HOSPITAL_COMMUNITY)
Admission: RE | Admit: 2024-04-04 | Discharge: 2024-04-04 | Disposition: A | Discharge status: Home / Self Care | Source: Ambulatory Visit | Attending: Cardiology | Admitting: Cardiology

## 2024-04-04 DIAGNOSIS — I5022 Chronic systolic (congestive) heart failure: Secondary | ICD-10-CM | POA: Insufficient documentation

## 2024-04-04 LAB — BASIC METABOLIC PANEL WITH GFR
Anion gap: 12 (ref 5–15)
BUN: 19 mg/dL (ref 6–20)
CO2: 19 mmol/L — ABNORMAL LOW (ref 22–32)
Calcium: 9.5 mg/dL (ref 8.9–10.3)
Chloride: 107 mmol/L (ref 98–111)
Creatinine, Ser: 0.73 mg/dL (ref 0.44–1.00)
GFR, Estimated: >60 mL/min (ref >60)
Glucose, Bld: 95 mg/dL (ref 70–99)
Potassium: 4.3 mmol/L (ref 3.5–5.1)
Sodium: 138 mmol/L (ref 135–145)

## 2024-04-23 ENCOUNTER — Other Ambulatory Visit (HOSPITAL_COMMUNITY): Payer: Self-pay | Admitting: Cardiology

## 2024-05-21 NOTE — Progress Notes (Signed)
 Patient did not show for appointment. Note left for templating purposes only                   Advanced Heart Failure Clinic Note   PCP: [facility]  Boston Byers FNP  HF Cardiologist: Dr Julane Ny  HPI: Pamela Bryan is a 53 y.o. female with PMH of alcohol  use disorder, tobacco use disorder and systolic HF due to NICM.   Admitted with 3/22 with acute systolic HF. EF 15-20% Cath with preserved cardiac output and severely reduced EF. MRI EF 11% concerning for possible myocarditis.   Echo 8/22 EF 20% severely dilated RV normal AoV functionally bicuspid mild AS at least moderate AI (reviewed by Dr. Julane Ny).   TEE (9/22) EF 20% with a severely dilated left ventricle and severe aortic insufficiency.  Right ventricular systolic function is moderately depressed.   RHC 11/22 showed well compensated filling pressures, moderately reduced CO 2/2 severe AI and LV dysfunction. She underwent aortic valve replacement with bioprosthetic valve 11/22. Hospitalization c/b post-op AF/ALF requiring IV amio and rapid pacing to NSR. Unfortunately went back into AFL, and started on Eliquis .   Echo 04/23: EF 20-25%, LV smaller.   Zio 14 day (4/23) showed no AF, rare PACs/PVCs, 2 nonsustained VT runs.  Echo 07/05/22 EF 25-30%, AVR stable, septal dyssynchrony  Here for f/u. At last visit losartan  switched to Entresto . Repeat echo ordered.    SH:  Social History   Socioeconomic History   Marital status: Divorced    Spouse name: Not on file   Number of children: 4   Years of education: 16   Highest education level: Bachelor's degree (e.g., BA, AB, BS)  Occupational History   Not on file  Tobacco Use   Smoking status: Former    Current packs/day: 0.00    Average packs/day: 1 pack/day for 30.0 years (30.0 ttl pk-yrs)    Types: Cigarettes    Start date: 11/03/1991    Quit date: 11/02/2021    Years since quitting: 2.5   Smokeless tobacco: Never  Vaping Use   Vaping status: Never Used  Substance and  Sexual Activity   Alcohol  use: Yes    Comment: occasional   Drug use: No   Sexual activity: Not Currently  Other Topics Concern   Not on file  Social History Narrative   Not on file   Social Drivers of Health   Financial Resource Strain: Not on file  Food Insecurity: Not on file  Transportation Needs: Not on file  Physical Activity: Not on file  Stress: Not on file  Social Connections: Unknown (05/10/2022)   Received from [facility], [facility]   Social Network    Social Network: Not on file  Intimate Partner Violence: Unknown (04/01/2022)   Received from [employer], [facility]   HITS    Physically Hurt: Not on file    Insult or Talk Down To: Not on file    Threaten Physical Harm: Not on file    Scream or Curse: Not on file   FH:  Family History  Problem Relation Age of Onset   Valvular heart disease Sister    Past Medical History:  Diagnosis Date   CHF (congestive heart failure) (HCC)    Coronary artery disease    History of hiatal hernia    Hypertension    Current Outpatient Medications  Medication Sig Dispense Refill   aspirin  EC 81 MG EC tablet Take 1 tablet (81 mg total) by mouth  daily. Swallow whole.     buPROPion  (WELLBUTRIN  SR) 150 MG 12 hr tablet Take 150 mg by mouth 2 (two) times daily.     carvedilol  (COREG ) 3.125 MG tablet TAKE 1 TABLET BY MOUTH 2 TIMES DAILY. NEEDS FOLLOW UP APPOINTMENT FOR MORE REFILLS 60 tablet 3   dapagliflozin  propanediol (FARXIGA ) 10 MG TABS tablet Take 1 tablet (10 mg total) by mouth daily. 90 tablet 3   diphenhydrAMINE (BENADRYL) 25 MG tablet Take 25 mg by mouth at bedtime as needed for allergies.     furosemide  (LASIX ) 40 MG tablet Take 1 tablet (40 mg total) by mouth daily as needed for fluid or edema. 30 tablet 3   levonorgestrel (LILETTA, 52 MG,) 19.5 MCG/DAY IUD IUD 1 Intra Uterine Device by Intrauterine route once.     Multiple Vitamin (MULTIVITAMIN) capsule Take 1 capsule by mouth daily.     Polyethyl  Glycol-Propyl Glycol (LUBRICANT EYE DROPS) 0.4-0.3 % SOLN Place 1-2 drops into both eyes 3 (three) times daily as needed (dry/irritated eyes).     sacubitril -valsartan  (ENTRESTO ) 24-26 MG Take 1 tablet by mouth 2 (two) times daily. 60 tablet 11   spironolactone  (ALDACTONE ) 25 MG tablet TAKE 1 TABLET (25 MG TOTAL) BY MOUTH DAILY. 30 tablet 6   No current facility-administered medications for this encounter.   There were no vitals taken for this visit.  Wt Readings from Last 3 Encounters:  03/13/24 75.8 kg (167 lb 3.2 oz)  07/05/22 71.7 kg (158 lb)  05/26/22 72.7 kg (160 lb 3.2 oz)   Physical Exam General:  Well appearing.  Neck: no JVD.  Cor: Regular rate & rhythm. No rubs, gallops or murmurs. Lungs: clear Abdomen: soft, nontender, nondistended.  Extremities: no edema Neuro: alert & orientedx3. Affect pleasant  ECG today personally reviewed: SR 83 bpm   ASSESSMENT & PLAN:  1. Chronic biventricular systolic CHF, NICM  - Echo (03/12/21): EF 15% severe RV dysfunction. - Cath (03/13/21): EF 15% No CAD. Elevated filling pressures. High cardiac output.  - cMRI 03/16/2021  EF < 20%, possible myocaritis.  - Possible ETOH CM. TSH normal, UDS negative. - Echo (07/30/21): EF 20% severely dilated RV normal AoV functionally bicuspid, mild AS at least moderate AI. - TEE (9/22): EF 20%, severely dilated LV, RV moderately depressed, severe AI. - Echo (4/23): EF 20-25%. LVIDd  6.5.  RV ok.  - Echo 07/05/22: EF 25-30%, RV okay, septal dyssynchrony. Discussed with EP. No role for LBBB pacing w/ septal dyssynchrony and normal QRS - NYHA I. Volume looks good. Uses lasix  PRN. - Continue carvedilol  3.125 mg bid  - Continue Farxiga  10 mg daily. - Continue Entresto  24/26 mg BID - Continue spiro 25 mg daily - BMET today and again in 7 days - Repeat echo in 2 months. ? Referral to EP for consideration of ICD if EF remains < or = 35%.  2. Paroxysmal AFL, post-op - Required IV amio and RAP. No known  recurrence - Off ivabradine  with AFL/AF. - Zio 14 day (4/23) showed no AF.  - Remains off Eliquis . - SR today.  3. Severe aortic insufficiency - S/P bioprosthetic AVR 11/22. - AV stable on echo 07/23  4. H/o cirrhosis - Suggestive on U/S 3/22.  5. Alcohol  Use disorder - No longer drinking alcohol    6. Tobacco Use disorder - No longer smoking.   7. Night Time Hypoxia - Sleep study negative for OSA.  - Has night time hypoxia.   Jules Oar MD 05/21/24 10:03 PM

## 2024-05-22 ENCOUNTER — Ambulatory Visit (HOSPITAL_COMMUNITY)

## 2024-05-22 ENCOUNTER — Inpatient Hospital Stay (HOSPITAL_COMMUNITY)
Admission: RE | Admit: 2024-05-22 | Discharge: 2024-05-22 | Disposition: A | Discharge status: Home / Self Care | Source: Ambulatory Visit | Attending: Internal Medicine | Admitting: Internal Medicine

## 2024-06-07 ENCOUNTER — Other Ambulatory Visit (HOSPITAL_COMMUNITY): Payer: Self-pay | Admitting: Internal Medicine

## 2024-07-10 NOTE — Progress Notes (Signed)
 Advanced Heart Failure Clinic Note    PCP: [facility], Jesus FNP  HF Cardiologist: Dr Cherrie  HPI: Pamela Bryan is a 53 y.o. female with PMH of alcohol  use disorder, tobacco use disorder and systolic HF due to NICM.   Admitted with 3/22 with acute systolic HF. EF 15-20% Cath with preserved cardiac output and severely reduced EF. MRI EF 11% concerning for possible myocarditis.   Echo 8/22 EF 20% severely dilated RV normal AoV functionally bicuspid mild AS at least moderate AI (reviewed by Dr. Cherrie).   TEE (9/22) EF 20% with a severely dilated left ventricle and severe aortic insufficiency.  Right ventricular systolic function is moderately depressed.   RHC 11/22 showed well compensated filling pressures, moderately reduced CO 2/2 severe AI and LV dysfunction. She underwent aortic valve replacement with bioprosthetic valve 11/22. Hospitalization c/b post-op AF/ALF requiring IV amio and rapid pacing to NSR. Unfortunately went back into AFL, and started on Eliquis .   Echo 04/23: EF 20-25%, LV smaller.   Zio 14 day (4/23) showed no AF, rare PACs/PVCs, 2 nonsustained VT runs.  Echo 07/05/22 EF 25-30%, AVR stable, septal dyssynchrony  Today she returns for HF follow up. Overall feeling fine. No issues at work at [employer]. Denies increasing SOB, palpitations, abnormal bleeding, CP, dizziness, edema, or PND/Orthopnea. Appetite ok. Weight at home 165 pounds. Taking all medications, has not needed Lasix . No tobacco use, drinking 2 glasses/wine a day. Wants to cut back.  Echo today 07/11/24 shows EF ~55-60% on my read, awaiting official MD interpretation.   SH:  Social History   Socioeconomic History   Marital status: Divorced    Spouse name: Not on file   Number of children: 4   Years of education: 16   Highest education level: Bachelor's degree (e.g., BA, AB, BS)  Occupational History   Not on file  Tobacco Use   Smoking status: Former    Current packs/day: 0.00     Average packs/day: 1 pack/day for 30.0 years (30.0 ttl pk-yrs)    Types: Cigarettes    Start date: 11/03/1991    Quit date: 11/02/2021    Years since quitting: 2.6   Smokeless tobacco: Never  Vaping Use   Vaping status: Never Used  Substance and Sexual Activity   Alcohol  use: Yes    Comment: occasional   Drug use: No   Sexual activity: Not Currently  Other Topics Concern   Not on file  Social History Narrative   Not on file   Social Drivers of Health   Financial Resource Strain: Not on file  Food Insecurity: Not on file  Transportation Needs: Not on file  Physical Activity: Not on file  Stress: Not on file  Social Connections: Unknown (05/10/2022)   Received from [facility]   Social Network    Social Network: Not on file  Intimate Partner Violence: Unknown (04/01/2022)   Received from [facility]   HITS    Physically Hurt: Not on file    Insult or Talk Down To: Not on file    Threaten Physical Harm: Not on file    Scream or Curse: Not on file   FH:  Family History  Problem Relation Age of Onset   Valvular heart disease Sister    Past Medical History:  Diagnosis Date   CHF (congestive heart failure) (HCC)    Coronary artery disease    History of hiatal hernia    Hypertension    Current Outpatient Medications  Medication  Sig Dispense Refill   aspirin  EC 81 MG EC tablet Take 1 tablet (81 mg total) by mouth daily. Swallow whole.     buPROPion  (WELLBUTRIN  SR) 150 MG 12 hr tablet Take 150 mg by mouth 2 (two) times daily.     carvedilol  (COREG ) 3.125 MG tablet TAKE 1 TABLET BY MOUTH 2 TIMES DAILY. NEEDS FOLLOW UP APPOINTMENT FOR MORE REFILLS 60 tablet 3   dapagliflozin  propanediol (FARXIGA ) 10 MG TABS tablet Take 1 tablet (10 mg total) by mouth daily. 90 tablet 3   diphenhydrAMINE (BENADRYL) 25 MG tablet Take 25 mg by mouth at bedtime as needed for allergies.     furosemide  (LASIX ) 40 MG tablet Take 1 tablet (40 mg total) by mouth daily as needed for fluid or edema.  30 tablet 3   levonorgestrel (LILETTA, 52 MG,) 19.5 MCG/DAY IUD IUD 1 Intra Uterine Device by Intrauterine route once.     Multiple Vitamin (MULTIVITAMIN) capsule Take 1 capsule by mouth daily.     Polyethyl Glycol-Propyl Glycol (LUBRICANT EYE DROPS) 0.4-0.3 % SOLN Place 1-2 drops into both eyes 3 (three) times daily as needed (dry/irritated eyes).     sacubitril -valsartan  (ENTRESTO ) 24-26 MG Take 1 tablet by mouth 2 (two) times daily. 60 tablet 11   spironolactone  (ALDACTONE ) 25 MG tablet TAKE 1 TABLET (25 MG TOTAL) BY MOUTH DAILY. 30 tablet 6   No current facility-administered medications for this encounter.   BP 110/78   Pulse 81   Wt 75.8 kg (167 lb)   SpO2 98%   BMI 26.16 kg/m   Wt Readings from Last 3 Encounters:  07/11/24 75.8 kg (167 lb)  03/13/24 75.8 kg (167 lb 3.2 oz)  07/05/22 71.7 kg (158 lb)   Physical Exam General:  NAD. No resp difficulty, walked into clinic HEENT: Normal, + nasal telangectasia  Neck: Supple. No JVD. Cor: Regular rate & rhythm. No rubs, gallops or murmurs. Lungs: Clear Abdomen: Soft, nontender, nondistended.  Extremities: No cyanosis, clubbing, rash, edema Neuro: Alert & oriented x 3, moves all 4 extremities w/o difficulty. Affect pleasant.   ASSESSMENT & PLAN:  1. Chronic biventricular systolic CHF, NICM  - Echo (03/12/21): EF 15% severe RV dysfunction. - Cath (03/13/21): EF 15% No CAD. Elevated filling pressures. High cardiac output.  - cMRI 03/16/2021  EF < 20%, possible myocaritis.  - Possible ETOH CM. TSH normal, UDS negative. - Echo (07/30/21): EF 20% severely dilated RV normal AoV functionally bicuspid, mild AS at least moderate AI. - TEE (9/22): EF 20%, severely dilated LV, RV moderately depressed, severe AI. - Echo (4/23): EF 20-25%. LVIDd  6.5.  RV ok.  - Echo 07/05/22: EF 25-30%, RV okay, septal dyssynchrony. Discussed with EP. No role for LBBB pacing w/ septal dyssynchrony and normal QRS - NYHA I. Volume looks good. Uses lasix  PRN. -  Continue carvedilol  3.125 mg bid  - Continue Farxiga  10 mg daily. - Continue Entresto  24/26 mg bid - Continue spiro 25 mg daily - Echo today, EF appears recovered ~ 55-60% on my read. - Labs today.  2. Paroxysmal AFL, post-op - Required IV amio and RAP.  - Off ivabradine  with AFL/AF. - Zio 14-day (4/23) showed no AF.  - Remains off Eliquis . - No known recurrence - Regular on exam today.  3. Severe aortic insufficiency - S/P bioprosthetic AVR 11/22. - AV stable on echo 07/23 - Echo today, results pending  4. H/o cirrhosis - Suggestive on U/S 3/22.  5. Alcohol  Use disorder - back drinking -  Discussed cessation and follow up with PCP regarding meds   6. Tobacco Use disorder - No longer smoking.   7. Night Time Hypoxia - Sleep study negative for OSA.  - Has night time hypoxia.  Looks good! Follow up in 6 months with Dr. Cherrie Raisin [facility] FNP-BC 07/11/24 9:00 AM

## 2024-07-11 ENCOUNTER — Ambulatory Visit (HOSPITAL_COMMUNITY): Payer: Self-pay | Admitting: Family Medicine

## 2024-07-11 ENCOUNTER — Ambulatory Visit (HOSPITAL_BASED_OUTPATIENT_CLINIC_OR_DEPARTMENT_OTHER)
Admission: RE | Admit: 2024-07-11 | Discharge: 2024-07-11 | Disposition: A | Discharge status: Home / Self Care | Source: Ambulatory Visit | Attending: Family Medicine | Admitting: Family Medicine

## 2024-07-11 ENCOUNTER — Ambulatory Visit (HOSPITAL_COMMUNITY)
Admission: RE | Admit: 2024-07-11 | Discharge: 2024-07-11 | Disposition: A | Discharge status: Home / Self Care | Source: Ambulatory Visit | Attending: Physician Assistant | Admitting: Physician Assistant

## 2024-07-11 ENCOUNTER — Encounter (HOSPITAL_COMMUNITY): Payer: Self-pay

## 2024-07-11 VITALS — BP 110/78 | HR 81 | Wt 167.0 lb

## 2024-07-11 DIAGNOSIS — Z87891 Personal history of nicotine dependence: Secondary | ICD-10-CM | POA: Diagnosis not present

## 2024-07-11 DIAGNOSIS — I5022 Chronic systolic (congestive) heart failure: Secondary | ICD-10-CM | POA: Insufficient documentation

## 2024-07-11 DIAGNOSIS — K7460 Unspecified cirrhosis of liver: Secondary | ICD-10-CM | POA: Diagnosis not present

## 2024-07-11 DIAGNOSIS — I110 Hypertensive heart disease with heart failure: Secondary | ICD-10-CM | POA: Insufficient documentation

## 2024-07-11 DIAGNOSIS — Z79899 Other long term (current) drug therapy: Secondary | ICD-10-CM | POA: Insufficient documentation

## 2024-07-11 DIAGNOSIS — I4892 Unspecified atrial flutter: Secondary | ICD-10-CM | POA: Diagnosis not present

## 2024-07-11 DIAGNOSIS — F1090 Alcohol use, unspecified, uncomplicated: Secondary | ICD-10-CM | POA: Diagnosis not present

## 2024-07-11 DIAGNOSIS — Z953 Presence of xenogenic heart valve: Secondary | ICD-10-CM | POA: Insufficient documentation

## 2024-07-11 DIAGNOSIS — I480 Paroxysmal atrial fibrillation: Secondary | ICD-10-CM | POA: Diagnosis not present

## 2024-07-11 DIAGNOSIS — Z7984 Long term (current) use of oral hypoglycemic drugs: Secondary | ICD-10-CM | POA: Insufficient documentation

## 2024-07-11 DIAGNOSIS — I428 Other cardiomyopathies: Secondary | ICD-10-CM | POA: Diagnosis not present

## 2024-07-11 DIAGNOSIS — I351 Nonrheumatic aortic (valve) insufficiency: Secondary | ICD-10-CM | POA: Insufficient documentation

## 2024-07-11 DIAGNOSIS — G4734 Idiopathic sleep related nonobstructive alveolar hypoventilation: Secondary | ICD-10-CM | POA: Insufficient documentation

## 2024-07-11 DIAGNOSIS — Z8719 Personal history of other diseases of the digestive system: Secondary | ICD-10-CM

## 2024-07-11 DIAGNOSIS — Z006 Encounter for examination for normal comparison and control in clinical research program: Secondary | ICD-10-CM

## 2024-07-11 DIAGNOSIS — I5082 Biventricular heart failure: Secondary | ICD-10-CM | POA: Diagnosis not present

## 2024-07-11 LAB — ECHOCARDIOGRAM COMPLETE
AR max vel: 0.98 cm2
AV Area VTI: 0.91 cm2
AV Area mean vel: 0.95 cm2
AV Mean grad: 8 mmHg
AV Peak grad: 12.7 mmHg
Ao pk vel: 1.78 m/s
Area-P 1/2: 6.9 cm2
Calc EF: 60.6 %
S' Lateral: 3.2 cm
Single Plane A2C EF: 59.5 %
Single Plane A4C EF: 61.1 %

## 2024-07-11 LAB — COMPREHENSIVE METABOLIC PANEL WITH GFR
ALT: 25 U/L (ref 0–44)
AST: 46 U/L — ABNORMAL HIGH (ref 15–41)
Albumin: 4.4 g/dL (ref 3.5–5.0)
Alkaline Phosphatase: 103 U/L (ref 38–126)
Anion gap: 13 (ref 5–15)
BUN: 15 mg/dL (ref 6–20)
CO2: 24 mmol/L (ref 22–32)
Calcium: 9.9 mg/dL (ref 8.9–10.3)
Chloride: 102 mmol/L (ref 98–111)
Creatinine, Ser: 0.86 mg/dL (ref 0.44–1.00)
GFR, Estimated: >60 mL/min (ref >60)
Glucose, Bld: 92 mg/dL (ref 70–99)
Potassium: 4.7 mmol/L (ref 3.5–5.1)
Sodium: 139 mmol/L (ref 135–145)
Total Bilirubin: 0.7 mg/dL (ref 0.0–1.2)
Total Protein: 7.6 g/dL (ref 6.5–8.1)

## 2024-07-11 NOTE — Patient Instructions (Addendum)
 Thank you for coming in today  If you had labs drawn today, any labs that are abnormal the clinic will call you No news is good news  Medications: no changes   Follow up appointments:  Your physician recommends that you schedule a follow-up appointment in:  6 months With Dr. Cherrie Please call our office to schedule the follow-up appointment in November 2025 for January 2026.    Do the following things EVERYDAY: Weigh yourself in the morning before breakfast. Write it down and keep it in a log. Take your medicines as prescribed Eat low salt foods--Limit salt (sodium) to 2000 mg per day.  Stay as active as you can everyday Limit all fluids for the day to less than 2 liters   At the Advanced Heart Failure Clinic, you and your health needs are our priority. As part of our continuing mission to provide you with exceptional heart care, we have created designated Provider Care Teams. These Care Teams include your primary Cardiologist (physician) and Advanced Practice Providers (APPs- Physician Assistants and Nurse Practitioners) who all work together to provide you with the care you need, when you need it.   You may see any of the following providers on your designated Care Team at your next follow up: Dr Toribio Cherrie Dr Ezra Shuck Dr. Ria Gardenia Greig Lenetta, NP Caffie Shed, [place] [facility] [place], [place] Beckey Coe, NP Tinnie Redman, PharmD   Please be sure to bring in all your medications bottles to every appointment.    Thank you for choosing [facility]  If you have any questions or concerns before your next appointment please send us  a message through [place] or call our office at [phone].    TO LEAVE A MESSAGE FOR THE NURSE SELECT OPTION 2, PLEASE LEAVE A MESSAGE INCLUDING: YOUR NAME DATE OF BIRTH CALL BACK NUMBER REASON FOR CALL**this is important as we prioritize the call backs  YOU WILL RECEIVE  A CALL BACK THE SAME DAY AS LONG AS YOU CALL BEFORE 4:00 PM

## 2024-07-11 NOTE — Research (Signed)
 SITE: [id number]     Subject # [id number]   Subprotocol: A  Inclusion Criteria  Patients who meet all of the following criteria are eligible for enrollment as study participants:  Yes No  Age > 52 years old X   Eligible to wear Holter Study X    Exclusion Criteria  Patients who meet any of these criteria are not eligible for enrollment as study participants: Yes No  1. Receiving any mechanical (respiratory or circulatory) or renal support therapy at Screening or during Visit #1.  X  2.  Any other conditions that in the opinion of the investigators are likely to prevent compliance with the study protocol or pose a safety concern if the subject participates in the study.  X  3. Poor tolerance, namely susceptible to severe skin allergies from ECG adhesive patch application.  X   Protocol: REV H    60 minute start window         Cor device must be applied, and the study initiated, no later than 60 minutes of completing the Echocardiogram                             HH:MM  Echo completion time  08:50  2.   Cor Study start time  09:03   30-Minute execution window  Once Cor Monitoring begins, 3 QT Med ECGs and the 15-minute rest period must be completed within a 30 minute window     HH:MM  3. QT Med ECG Completion time  09:31  4. Start of 15-Min sitting rest period  09:10  5. End of 15-Min rest period  09:24  6. Time of device removal  09:33   *Continue to use the Mobile App Event feature to log the Rest period windows and follow instructions on the EF-ACT Clinical Trial  Patient Instruction Card.  Describe any anomalies in Protocol execution in the Protocol Deviation Log    Residential Zip code [phone] (First 3 digits ONLY)                                           [facility] Informed Consent   Subject Name: Pamela Bryan  Subject met inclusion and exclusion criteria.  The informed consent form, study requirements and expectations were reviewed with the subject. Subject had opportunity to  read consent and questions and concerns were addressed prior to the signing of the consent form.  The subject verbalized understanding of the trial requirements.  The subject agreed to participate in the [facility] EF ACT trial and signed the informed consent at 08:58 on 11-Jul-2024.  The informed consent was obtained prior to performance of any protocol-specific procedures for the subject.  A copy of the signed informed consent was given to the subject and a copy was placed in the subject's medical record.   [name]          Current Outpatient Medications:    aspirin  EC 81 MG EC tablet, Take 1 tablet (81 mg total) by mouth daily. Swallow whole., Disp: , Rfl:    buPROPion  (WELLBUTRIN  SR) 150 MG 12 hr tablet, Take 150 mg by mouth 2 (two) times daily., Disp: , Rfl:    carvedilol  (COREG ) 3.125 MG tablet, TAKE 1 TABLET BY MOUTH 2 TIMES DAILY. NEEDS FOLLOW UP APPOINTMENT FOR MORE REFILLS, Disp: 60 tablet, Rfl:  3   dapagliflozin  propanediol (FARXIGA ) 10 MG TABS tablet, Take 1 tablet (10 mg total) by mouth daily., Disp: 90 tablet, Rfl: 3   diphenhydrAMINE (BENADRYL) 25 MG tablet, Take 25 mg by mouth at bedtime as needed for allergies., Disp: , Rfl:    furosemide  (LASIX ) 40 MG tablet, Take 1 tablet (40 mg total) by mouth daily as needed for fluid or edema., Disp: 30 tablet, Rfl: 3   levonorgestrel (LILETTA, 52 MG,) 19.5 MCG/DAY IUD IUD, 1 Intra Uterine Device by Intrauterine route once., Disp: , Rfl:    Multiple Vitamin (MULTIVITAMIN) capsule, Take 1 capsule by mouth daily., Disp: , Rfl:    Polyethyl Glycol-Propyl Glycol (LUBRICANT EYE DROPS) 0.4-0.3 % SOLN, Place 1-2 drops into both eyes 3 (three) times daily as needed (dry/irritated eyes)., Disp: , Rfl:    sacubitril -valsartan  (ENTRESTO ) 24-26 MG, Take 1 tablet by mouth 2 (two) times daily., Disp: 60 tablet, Rfl: 11   spironolactone  (ALDACTONE ) 25 MG tablet, TAKE 1 TABLET (25 MG TOTAL) BY MOUTH DAILY., Disp: 30 tablet, Rfl: 6

## 2024-07-19 ENCOUNTER — Other Ambulatory Visit: Payer: Self-pay | Admitting: Medical Genetics

## 2024-08-15 ENCOUNTER — Other Ambulatory Visit (HOSPITAL_COMMUNITY): Payer: Self-pay | Admitting: Internal Medicine

## 2024-10-26 ENCOUNTER — Other Ambulatory Visit: Payer: Self-pay | Admitting: Medical Genetics

## 2024-10-26 DIAGNOSIS — Z006 Encounter for examination for normal comparison and control in clinical research program: Secondary | ICD-10-CM

## 2024-12-14 ENCOUNTER — Other Ambulatory Visit (HOSPITAL_COMMUNITY): Payer: Self-pay | Admitting: Internal Medicine

## 2025-01-04 ENCOUNTER — Ambulatory Visit (HOSPITAL_COMMUNITY): Admitting: Internal Medicine

## 2025-01-12 ENCOUNTER — Other Ambulatory Visit (HOSPITAL_COMMUNITY): Payer: Self-pay | Admitting: Internal Medicine

## 2025-01-24 ENCOUNTER — Ambulatory Visit (HOSPITAL_COMMUNITY)
Admission: RE | Admit: 2025-01-24 | Discharge: 2025-01-24 | Disposition: A | Discharge status: Home / Self Care | Source: Ambulatory Visit | Attending: Internal Medicine | Admitting: Internal Medicine

## 2025-01-24 ENCOUNTER — Encounter (HOSPITAL_COMMUNITY): Payer: Self-pay | Admitting: Internal Medicine

## 2025-01-24 VITALS — BP 112/80 | HR 93 | Wt 166.8 lb

## 2025-01-24 DIAGNOSIS — I351 Nonrheumatic aortic (valve) insufficiency: Secondary | ICD-10-CM

## 2025-01-24 DIAGNOSIS — Z79899 Other long term (current) drug therapy: Secondary | ICD-10-CM | POA: Insufficient documentation

## 2025-01-24 DIAGNOSIS — I5082 Biventricular heart failure: Secondary | ICD-10-CM | POA: Diagnosis not present

## 2025-01-24 DIAGNOSIS — F1090 Alcohol use, unspecified, uncomplicated: Secondary | ICD-10-CM

## 2025-01-24 DIAGNOSIS — I5022 Chronic systolic (congestive) heart failure: Secondary | ICD-10-CM | POA: Diagnosis present

## 2025-01-24 DIAGNOSIS — F1010 Alcohol abuse, uncomplicated: Secondary | ICD-10-CM | POA: Insufficient documentation

## 2025-01-24 DIAGNOSIS — Z87891 Personal history of nicotine dependence: Secondary | ICD-10-CM | POA: Diagnosis not present

## 2025-01-24 DIAGNOSIS — Z87898 Personal history of other specified conditions: Secondary | ICD-10-CM | POA: Insufficient documentation

## 2025-01-24 DIAGNOSIS — Z599 Problem related to housing and economic circumstances, unspecified: Secondary | ICD-10-CM | POA: Insufficient documentation

## 2025-01-24 DIAGNOSIS — Z7984 Long term (current) use of oral hypoglycemic drugs: Secondary | ICD-10-CM | POA: Insufficient documentation

## 2025-01-24 DIAGNOSIS — Z952 Presence of prosthetic heart valve: Secondary | ICD-10-CM | POA: Insufficient documentation

## 2025-01-24 DIAGNOSIS — Z6379 Other stressful life events affecting family and household: Secondary | ICD-10-CM | POA: Insufficient documentation

## 2025-01-24 DIAGNOSIS — I428 Other cardiomyopathies: Secondary | ICD-10-CM | POA: Insufficient documentation

## 2025-01-24 MED ORDER — AMOXICILLIN 500 MG PO TABS: 2000.0000 mg | ORAL_TABLET | ORAL | 3 refills | Status: AC | PRN

## 2025-01-24 NOTE — Addendum Note (Signed)
 Encounter addended by: Buell Powell HERO, RN on: 01/24/2025 10:35 AM  Actions taken: Clinical Note Signed, Order list changed, Diagnosis association updated, Charge Capture section accepted

## 2025-01-24 NOTE — Patient Instructions (Signed)
 Medication Changes:  Continue current medications  Make sure to take Amoxicillin  2000 mg (4 tabs) 1 hour prior to dental work, prescription has been sent in for this  Lab Work:  Labs done today, your results will be available in MyChart, we will contact you for abnormal readings.  Testing/Procedures:  Your physician has requested that you have an echocardiogram. Echocardiography is a painless test that uses sound waves to create images of your heart. It provides your doctor with information about the size and shape of your heart and how well your hearts chambers and valves are working. This procedure takes approximately one hour. There are no restrictions for this procedure. Please do NOT wear cologne, perfume, aftershave, or lotions (deodorant is allowed). Please arrive 15 minutes prior to your appointment time. IN 6 MONTHS  Please note: We ask at that you not bring children with you during ultrasound (echo/ vascular) testing. Due to room size and safety concerns, children are not allowed in the ultrasound rooms during exams. Our front office staff cannot provide observation of children in our lobby area while testing is being conducted. An adult accompanying a patient to their appointment will only be allowed in the ultrasound room at the discretion of the ultrasound technician under special circumstances. We apologize for any inconvenience.   Special Instructions // Education:  Do the following things EVERYDAY: Weigh yourself in the morning before breakfast. Write it down and keep it in a log. Take your medicines as prescribed Eat low salt foods--Limit salt (sodium) to 2000 mg per day.  Stay as active as you can everyday Limit all fluids for the day to less than 2 liters   Follow-Up in: 6 months with an echocardiogram (July 2026), **PLEASE CALL OUR OFFICE IN MAY TO SCHEDULE THESE APPOINTMENTS   At the Advanced Heart Failure Clinic, you and your health needs are our priority. We  have a designated team specialized in the treatment of Heart Failure. This Care Team includes your primary Heart Failure Specialized Cardiologist (physician), Advanced Practice Providers (APPs- Physician Assistants and Nurse Practitioners), and Pharmacist who all work together to provide you with the care you need, when you need it.   You may see any of the following providers on your designated Care Team at your next follow up:  Dr. Toribio Fuel Dr. Ezra Shuck Dr. Odis Brownie Greig Mosses, NP Caffie Shed, [place] [facility] [place], [place] Beckey Coe, NP Jordan Lee, NP Tinnie Redman, PharmD   Please be sure to bring in all your medications bottles to every appointment.   Need to Contact Us :  If you have any questions or concerns before your next appointment please send us  a message through [place] or call our office at [phone].    TO LEAVE A MESSAGE FOR THE NURSE SELECT OPTION 2, PLEASE LEAVE A MESSAGE INCLUDING: YOUR NAME DATE OF BIRTH CALL BACK NUMBER REASON FOR CALL**this is important as we prioritize the call backs  YOU WILL RECEIVE A CALL BACK THE SAME DAY AS LONG AS YOU CALL BEFORE 4:00 PM

## 2025-01-24 NOTE — Progress Notes (Signed)
 "  Advanced Heart Failure Clinic Note    PCP: [facility], Jesus FNP  HF Cardiologist: Dr Cherrie  HPI: Pamela Bryan is a 54 y.o. female with PMH of alcohol  use disorder, tobacco use disorder and systolic HF due to NICM.   Admitted with 3/22 with acute systolic HF. EF 15-20% Cath with preserved cardiac output and severely reduced EF. MRI EF 11% concerning for possible myocarditis.   Echo 8/22 EF 20% severely dilated RV normal AoV functionally bicuspid mild AS at least moderate AI (reviewed by Dr. Cherrie).   TEE (9/22) EF 20% with a severely dilated left ventricle and severe aortic insufficiency.  Right ventricular systolic function is moderately depressed.   RHC 11/22 showed well compensated filling pressures, moderately reduced CO 2/2 severe AI and LV dysfunction. She underwent aortic valve replacement with bioprosthetic valve 11/22. Hospitalization c/b post-op AF/ALF requiring IV amio and rapid pacing to NSR. Unfortunately went back into AFL, and started on Eliquis .   Echo 04/23: EF 20-25%, LV smaller.   Zio 14 day (4/23) showed no AF, rare PACs/PVCs, 2 nonsustained VT runs.  Echo 07/05/22 EF 25-30%, AVR stable, septal dyssynchrony  - Echo 7/25 EF 55-60% AVR ok   Today she returns for HF follow up. Feels ok but under a lot of stress. Daughter tried to commit suicide. Also struggling with finances and has returned to drinking. Goes to AA 3x/week. No SOB or swelling. Taking heart meds   SH:  Social History   Socioeconomic History   Marital status: Divorced    Spouse name: Not on file   Number of children: 4   Years of education: 16   Highest education level: Bachelor's degree (e.g., BA, AB, BS)  Occupational History   Not on file  Tobacco Use   Smoking status: Former    Current packs/day: 0.00    Average packs/day: 1 pack/day for 30.0 years (30.0 ttl pk-yrs)    Types: Cigarettes    Start date: 11/03/1991    Quit date: 11/02/2021    Years since quitting: 3.2   Smokeless  tobacco: Never  Vaping Use   Vaping status: Never Used  Substance and Sexual Activity   Alcohol  use: Yes    Comment: occasional   Drug use: No   Sexual activity: Not Currently  Other Topics Concern   Not on file  Social History Narrative   Not on file   Social Drivers of Health   Tobacco Use: Medium Risk (01/24/2025)   Patient History    Smoking Tobacco Use: Former    Smokeless Tobacco Use: Never    Passive Exposure: Not on Actuary Strain: Not on file  Food Insecurity: Not on file  Transportation Needs: Not on file  Physical Activity: Not on file  Stress: Not on file  Social Connections: Unknown (05/10/2022)   Received from [facility]   Social Network    Social Network: Not on file  Intimate Partner Violence: Unknown (04/01/2022)   Received from [facility]   HITS    Physically Hurt: Not on file    Insult or Talk Down To: Not on file    Threaten Physical Harm: Not on file    Scream or Curse: Not on file  Depression (PHQ2-9): Low Risk (01/07/2022)   Depression (PHQ2-9)    PHQ-2 Score: 0  Alcohol  Screen: Not on file  Housing: Not on file  Utilities: Not on file  Health Literacy: Not on file   FH:  Family History  Problem Relation Age of Onset   Valvular heart disease Sister    Past Medical History:  Diagnosis Date   CHF (congestive heart failure) (HCC)    Coronary artery disease    History of hiatal hernia    Hypertension    Current Outpatient Medications  Medication Sig Dispense Refill   aspirin  EC 81 MG EC tablet Take 1 tablet (81 mg total) by mouth daily. Swallow whole.     carvedilol  (COREG ) 3.125 MG tablet TAKE 1 TABLET BY MOUTH 2 TIMES DAILY. NEEDS FOLLOW UP APPOINTMENT FOR MORE REFILLS 60 tablet 3   dapagliflozin  propanediol (FARXIGA ) 10 MG TABS tablet Take 1 tablet (10 mg total) by mouth daily. 90 tablet 3   diphenhydrAMINE (BENADRYL) 25 MG tablet Take 25 mg by mouth at bedtime as needed for allergies.     furosemide  (LASIX ) 40 MG  tablet Take 1 tablet (40 mg total) by mouth daily as needed for fluid or edema. 30 tablet 3   levonorgestrel (LILETTA, 52 MG,) 19.5 MCG/DAY IUD IUD 1 Intra Uterine Device by Intrauterine route once.     Multiple Vitamin (MULTIVITAMIN) capsule Take 1 capsule by mouth daily.     Polyethyl Glycol-Propyl Glycol (LUBRICANT EYE DROPS) 0.4-0.3 % SOLN Place 1-2 drops into both eyes 3 (three) times daily as needed (dry/irritated eyes).     sacubitril -valsartan  (ENTRESTO ) 24-26 MG Take 1 tablet by mouth 2 (two) times daily. 60 tablet 11   spironolactone  (ALDACTONE ) 25 MG tablet TAKE 1 TABLET (25 MG TOTAL) BY MOUTH DAILY. 30 tablet 6   buPROPion  (WELLBUTRIN  SR) 150 MG 12 hr tablet Take 150 mg by mouth 2 (two) times daily. (Patient not taking: Reported on 01/24/2025)     No current facility-administered medications for this encounter.   BP 112/80   Pulse 93   Wt 75.7 kg (166 lb 12.8 oz)   SpO2 95%   BMI 26.12 kg/m   Wt Readings from Last 3 Encounters:  01/24/25 75.7 kg (166 lb 12.8 oz)  07/11/24 75.8 kg (167 lb)  03/13/24 75.8 kg (167 lb 3.2 oz)   Physical Exam General:  Sitting up. No resp difficulty HEENT: normal Neck: supple. no JVD.  Cor: Regular rate & rhythm. No rubs, gallops or murmurs. Lungs: clear Abdomen: soft, nontender, nondistended.Good bowel sounds. Extremities: no cyanosis, clubbing, rash, edema Neuro: alert & orientedx3, cranial nerves grossly intact. moves all 4 extremities w/o difficulty. Affect pleasant but tearful   ECG: NSR 96 No ST-T wave abnormalities.    ASSESSMENT & PLAN:  1. Chronic biventricular systolic CHF, NICM  - Echo (03/12/21): EF 15% severe RV dysfunction. - Cath (03/13/21): EF 15% No CAD. Elevated filling pressures. High cardiac output.  - cMRI 03/16/2021  EF < 20%, possible myocaritis.  - Possible ETOH CM. TSH normal, UDS negative. - Echo (07/30/21): EF 20% severely dilated RV normal AoV functionally bicuspid, mild AS at least moderate AI. - TEE (9/22): EF  20%, severely dilated LV, RV moderately depressed, severe AI. - Echo (4/23): EF 20-25%. LVIDd  6.5.  RV ok.  - Echo 07/05/22: EF 25-30%, RV okay, septal dyssynchrony. Discussed with EP. No role for LBBB pacing w/ septal dyssynchrony and normal QRS - Echo 7/25 EF 55-60% AVR ok  - NYHA I Volume ok  - Continue carvedilol  3.125 mg bid  - Continue Farxiga  10 mg daily. - Continue Entresto  24/26 mg bid - Continue spiro 25 mg daily - Labs today - Advised to limit ETOH - Repeat 6 months  2. Paroxysmal AFL, post-op - Zio 14-day (4/23) showed no AF.  - Remains off Eliquis  and amio - No known recurrence - Regular on exam today.  3. Severe aortic insufficiency - S/P bioprosthetic AVR 11/22. - AV stable on echo 7/25 - Discussed SBE prophylaxis  4. H/o cirrhosis - Suggestive on U/S 3/22.  5. Alcohol  Use disorder - back to drinking - discussed need for cessation for heart health   6. Tobacco Use disorder - Has quit    Toribio Fuel MD 01/24/25 10:22 AM "
